# Patient Record
Sex: Female | Born: 1945 | Race: White | Hispanic: No | Marital: Married | Smoking: Never smoker
Health system: Southern US, Community
[De-identification: ages and names within clinical notes are randomized; demographics above are authoritative.]

## PROBLEM LIST (undated history)

## (undated) DIAGNOSIS — J302 Other seasonal allergic rhinitis: Secondary | ICD-10-CM

## (undated) DIAGNOSIS — K589 Irritable bowel syndrome without diarrhea: Secondary | ICD-10-CM

## (undated) DIAGNOSIS — I499 Cardiac arrhythmia, unspecified: Secondary | ICD-10-CM

## (undated) DIAGNOSIS — G43909 Migraine, unspecified, not intractable, without status migrainosus: Secondary | ICD-10-CM

## (undated) DIAGNOSIS — I429 Cardiomyopathy, unspecified: Secondary | ICD-10-CM

## (undated) DIAGNOSIS — M1990 Unspecified osteoarthritis, unspecified site: Secondary | ICD-10-CM

## (undated) DIAGNOSIS — K219 Gastro-esophageal reflux disease without esophagitis: Secondary | ICD-10-CM

## (undated) DIAGNOSIS — I10 Essential (primary) hypertension: Secondary | ICD-10-CM

## (undated) DIAGNOSIS — K5900 Constipation, unspecified: Secondary | ICD-10-CM

## (undated) DIAGNOSIS — F419 Anxiety disorder, unspecified: Secondary | ICD-10-CM

## (undated) DIAGNOSIS — E7800 Pure hypercholesterolemia, unspecified: Secondary | ICD-10-CM

## (undated) DIAGNOSIS — R519 Headache, unspecified: Secondary | ICD-10-CM

## (undated) HISTORY — PX: ABDOMINAL SURGERY: SHX537

## (undated) HISTORY — PX: COLON SURGERY: SHX602

## (undated) HISTORY — PX: COLONOSCOPY: SHX174

## (undated) HISTORY — PX: COLOSTOMY: SHX63

## (undated) HISTORY — PX: BREAST EXCISIONAL BIOPSY: SUR124

## (undated) HISTORY — PX: COLOSTOMY REVERSAL: SHX5782

---

## 1997-09-25 ENCOUNTER — Ambulatory Visit (HOSPITAL_COMMUNITY): Admission: RE | Admit: 1997-09-25 | Discharge: 1997-09-25 | Payer: Self-pay | Admitting: Internal Medicine

## 1999-01-23 ENCOUNTER — Encounter: Payer: Self-pay | Admitting: Internal Medicine

## 1999-01-23 ENCOUNTER — Encounter: Admission: RE | Admit: 1999-01-23 | Discharge: 1999-01-23 | Payer: Self-pay | Admitting: Internal Medicine

## 1999-07-22 ENCOUNTER — Ambulatory Visit (HOSPITAL_COMMUNITY): Admission: RE | Admit: 1999-07-22 | Discharge: 1999-07-22 | Payer: Self-pay | Admitting: Internal Medicine

## 1999-07-22 ENCOUNTER — Encounter: Payer: Self-pay | Admitting: Internal Medicine

## 2002-11-08 ENCOUNTER — Ambulatory Visit (HOSPITAL_COMMUNITY): Admission: RE | Admit: 2002-11-08 | Discharge: 2002-11-08 | Payer: Self-pay | Admitting: Internal Medicine

## 2002-11-08 ENCOUNTER — Encounter: Payer: Self-pay | Admitting: Internal Medicine

## 2003-11-06 ENCOUNTER — Other Ambulatory Visit: Admission: RE | Admit: 2003-11-06 | Discharge: 2003-11-06 | Payer: Self-pay | Admitting: Internal Medicine

## 2004-08-14 ENCOUNTER — Encounter (INDEPENDENT_AMBULATORY_CARE_PROVIDER_SITE_OTHER): Payer: Self-pay | Admitting: Specialist

## 2004-08-14 ENCOUNTER — Inpatient Hospital Stay (HOSPITAL_COMMUNITY): Admission: EM | Admit: 2004-08-14 | Discharge: 2004-08-23 | Payer: Self-pay | Admitting: Emergency Medicine

## 2005-02-27 ENCOUNTER — Inpatient Hospital Stay (HOSPITAL_COMMUNITY): Admission: RE | Admit: 2005-02-27 | Discharge: 2005-03-06 | Payer: Self-pay | Admitting: *Deleted

## 2005-02-27 ENCOUNTER — Encounter (INDEPENDENT_AMBULATORY_CARE_PROVIDER_SITE_OTHER): Payer: Self-pay | Admitting: *Deleted

## 2006-03-09 ENCOUNTER — Other Ambulatory Visit: Admission: RE | Admit: 2006-03-09 | Discharge: 2006-03-09 | Payer: Self-pay | Admitting: Family Medicine

## 2007-03-29 ENCOUNTER — Other Ambulatory Visit: Admission: RE | Admit: 2007-03-29 | Discharge: 2007-03-29 | Payer: Self-pay | Admitting: Family Medicine

## 2007-04-19 ENCOUNTER — Ambulatory Visit (HOSPITAL_COMMUNITY): Admission: RE | Admit: 2007-04-19 | Discharge: 2007-04-19 | Payer: Self-pay | Admitting: Family Medicine

## 2007-05-03 ENCOUNTER — Encounter (INDEPENDENT_AMBULATORY_CARE_PROVIDER_SITE_OTHER): Payer: Self-pay | Admitting: Radiology

## 2007-05-03 ENCOUNTER — Encounter: Admission: RE | Admit: 2007-05-03 | Discharge: 2007-05-03 | Payer: Self-pay | Admitting: Family Medicine

## 2008-04-03 ENCOUNTER — Other Ambulatory Visit: Admission: RE | Admit: 2008-04-03 | Discharge: 2008-04-03 | Payer: Self-pay | Admitting: Family Medicine

## 2009-08-20 ENCOUNTER — Encounter: Admission: RE | Admit: 2009-08-20 | Discharge: 2009-08-20 | Payer: Self-pay | Admitting: Family Medicine

## 2010-06-06 NOTE — Discharge Summary (Signed)
NAME:  Cynthia Bradshaw, Cynthia Bradshaw               ACCOUNT NO.:  [id number]   MEDICAL RECORD NO.:  [id number]          PATIENT TYPE:  INP   LOCATION:  5743                         FACILITY:  [facility]   PHYSICIAN:  Maisie Fus A. Cornett, M.D.DATE OF BIRTH:  06/02/1945   DATE OF ADMISSION:  08/14/2004  DATE OF DISCHARGE:  08/23/2004                                 DISCHARGE SUMMARY   ADMITTING DIAGNOSIS:  Perforated stercoral ulcer.   DISCHARGE DIAGNOSES:  1.  Perforated stercoral ulcer.  2.  Leukocytosis.  3.  Persistent elevation of liver function studies.  4.  History of irritable bowel syndrome.  5.  Gastroesophageal reflux disease.  6.  History of migraines.   BRIEF HISTORY:  The patient is a 65 year old female admitted on August 14, 2004, with a one-week history of abdominal pain.  She was seen and evaluated  and felt to have an acute abdomen.  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  She was  admitted to the hospital after this.   HOSPITAL COURSE:  Her postoperative course was complicated by the following  problems:   Problem 1. She had an elevation in her white count that remained elevated  for some time.  She was on Tygacil antibiotic on admission, and this  elevation of white count persisted for the next seven days.  She underwent  CT scanning which did not reveal any intra-abdominal abscess.  She had  bilateral pleural effusion, which were stable and/or asymptomatic.  Her  white count at this point in time could not be attributing to anything in  her abdomen.  We stopped her antibiotics at that point, and the white count  began to go down.   Problem 2. She had elevation of her liver function studies with a mild  elevation in her transaminases, but more elevation in her bilirubin.  Again,  this was felt to be secondary to Tygacil, and this drug was stopped as well.  Ultrasound of her abdomen was done, which showed no gallstones or common  duct dilatation.  She also had a CT scan which showed no evidence of biliary  tract disease as well.  MRCP was ordered, but done late, and that report is  still pending.  Her bilirubin peaked at 4.1 and remained stable there after  stopping her antibiotics three days prior to discharge.   She had been ambulating quite well, tolerating her diet without difficulty.  She had no evidence of significant tachycardia or fever.  Her ostomy looked  good and functioned well.  Her wound was clean, but was open in numerous  areas to facilitate drainage.  There was good granulation tissue at the base  of the wound and some exposed suture, but no obvious dehiscence was noted.  Her skin showed no signs of cellulitis, and her abdomen was nondistended and  nontender.  She was ambulating quite well, eating quite well, without any  problems whatsoever.   At this point in time, after a lengthy discussion with the patient about her  laboratory findings, they wished to go home.  Clinically, she was doing well  and was subsequently discharged home at that point.  She is having home  health follow her at this point in time, and they will do wound care.  We  have also asked them to draw her blood next week and have these faxed to the  office to see if they are stable.  The patient may require GI consultation  if this does not resolve.   DISCHARGE INSTRUCTIONS:  The patient will follow up in seven to 10 days with  Dr. Luan Pulling.  We will get home health arranged, which has already been  done for wound care.   DISCHARGE MEDICATIONS:  1.  Protonix 40 mg p.o. daily.  2.  Vicodin for pain.   She will be given supplies for wound care until home health can see her.   CONDITION ON DISCHARGE:  Satisfactory and stable.       TAC/MEDQ  D:  08/23/2004  T:   08/24/2004  Job:  [id number]

## 2010-06-06 NOTE — H&P (Signed)
NAME:  Cynthia Bradshaw, Cynthia Bradshaw NO.:  [id number]   MEDICAL RECORD NO.:  [id number]          PATIENT TYPE:  EMS   LOCATION:  MAJO                         FACILITY:  [facility]   PHYSICIAN:  Vikki Ports, MDDATE OF BIRTH:  1945/04/30   DATE OF ADMISSION:  08/14/2004  DATE OF DISCHARGE:                                HISTORY & PHYSICAL   CHIEF COMPLAINT:  Abdominal pain.   HISTORY OF PRESENT ILLNESS:  Ms. Mcphee has a 1 week history of crampy  abdominal pain consistent with her irritable bowel syndrome. This is  associated with nausea and vomiting. She states that the pain felt different  today and she developed a hard stomach. She really could not discern what  the difference was. Her last bowel movement was yesterday and she had poor  bowel movement according to the patient and her husband. Because of the  hard stomach, she came to the emergency room. X-ray in the emergency room  revealed free air into the diaphragm and this is consistent with perforated  viscus.   ALLERGIES:  None.   MEDICATIONS:  Prilosec over-the-counter, irritable bowel medication,  migraine medicine.   PAST MEDICAL HISTORY:  Irritable bowel syndrome, migraines, gastroesophageal  reflux disease.   SOCIAL HISTORY:  No tobacco and no alcohol or illicit drug use. She is  married and does yard work.   FAMILY HISTORY:  Unknown.   REVIEW OF SYSTEMS:  No chest pain. No shortness of breath. Otherwise as  above. Otherwise negative.   PHYSICAL EXAMINATION:  VITAL SIGNS:  Temperature 97.1, pulse 90, respiratory  rate 22, blood pressure 109/68.  GENERAL:  She appears acutely ill and in pain.  HEENT:  Grossly normal. No carotid or __________ bruits. No JVD or  thyromegaly. Sclerae clear. Conjunctivae normal. Nares, no discharge.  CHEST:  Clear to auscultation bilaterally. No wheezing or rhonchi.  HEART:  Tachycardiac. No gross murmur.  ABDOMEN:  Rigid abdomen. No bowel sounds.  EXTREMITIES:  No  peripheral edema.  SKIN:  Warm and dry.   LABORATORY DATA:  X-ray reveals free air in the diaphragm.   Potassium is 2.9, white count is 2.9.   ASSESSMENT/PLAN:  She has a perforated viscus. We will take her to the  operating room emergently. I will go ahead and replace her potassium on the  way to the operating room. The emergency room physician has already started  Tygacil as part of the antibiotic treatment. Her electrocardiogram  does  show non-specific STT wave changes in the inferolateral leads, which is  probably secondary to tachycardia.       LB/MEDQ  D:  08/14/2004  T:  08/14/2004  Job:  [id number]   cc:   Vikki Ports, MD  [address], [address]  [place]  [place] [address]

## 2010-06-06 NOTE — H&P (Signed)
NAME:  Cynthia Bradshaw, Cynthia Bradshaw          ACCOUNT NO.:  [id number]   MEDICAL RECORD NO.:  [id number]          PATIENT TYPE:  INP   LOCATION:  1610                         [facility]ACILITY:  [facility]   PHYSICIAN:  Alfonse Ras, MD   DATE OF BIRTH:  Jul 19, 1945   DATE OF ADMISSION:  02/27/2005  DATE OF DISCHARGE:  03/06/2005                                HISTORY & PHYSICAL   HISTORY OF PRESENT ILLNESS:  The patient is a pleasant 65 year old white  female who is now about six months status post Luz Brazen procedure for  perforated rectum secondary to a stercoral ulcer.  The patient was brought  in after undergoing home bowel prep for take-down of colostomy and ventral  hernia repair.   PAST MEDICAL HISTORY:  Noncontributory except for hypopotassemia in the past  requiring supplemental potassium.   MEDICATIONS ON ADMISSION:  I cannot find the medical reconciliation form in  the chart.   PHYSICAL EXAMINATION:  GENERAL APPEARANCE:  She is an age-appropriate white  female in no distress.  HEENT:  Examination is benign.  Normocephalic, atraumatic.  LUNGS:  Clear to auscultation and percussion.  HEART: Has a regular rate and rhythm.  ABDOMEN:  Shows a pink, well matured colostomy in the left lower quadrant  and a reducible midline ventral hernia.  EXTREMITIES:  Show no cyanosis, clubbing or edema.   IMPRESSION:  Ventral hernia and status post Hartman's procedure.   PLAN:  Admission after home bowel prep for takedown of colostomy and ventral  hernia repair.      Alfonse Ras, MD  Electronically Signed     KRE/MEDQ  D:  03/24/2005  T:  03/24/2005  Job:  [id number]

## 2010-06-06 NOTE — Op Note (Signed)
NAME:  Cynthia Bradshaw, Cynthia Bradshaw          ACCOUNT NO.:  [id number]   MEDICAL RECORD NO.:  [id number]          PATIENT TYPE:  INP   LOCATION:  0008                         FACILITY:  [facility]   PHYSICIAN:  Lebron Conners, M.D.   DATE OF BIRTH:  August 31, 1945   DATE OF PROCEDURE:  02/27/2005  DATE OF DISCHARGE:                                 OPERATIVE REPORT   PRE AND POSTOPERATIVE DIAGNOSIS:  Bleeding from the superficial part of the  abdominal wound.   OPERATION:  Evacuation of hematoma, control of bleeders, placement of  suction drain.   SURGEON:  Bowman.   ANESTHESIA:  General.   PROCEDURE:  After the patient was monitored and anesthetized and had routine  preparation and draping of the abdomen. I took out several more staples from  the midline abdominal wound and saw that there was very sizable hematoma. I  removed all the staples and evacuated the hematoma. I found the several  small oozing spots but no predominant bleeding vessel. I copiously irrigated  the wound and removed the irrigant several times and found further bleeders.  There seemed to be some oozing from the area of the colostomy closure so I  opened that wound and checked for hematoma there. I did not find any  bleeding at that location. After being satisfied that I had controlled the  bleeding, I placed a 10 mm flat Blake drain through a separate stab incision  in the left upper quadrant and secured it with 2-0 silk. I closed the wound  over that after mobilizing the skin slightly at of a couple of points. I  closed the colostomy with staples as well and placed Telfa wicks between the  staples. I applied a bulky slightly compressive bandage. The drain seemed to  hold suction well. The patient was stable throughout the procedure.      Lebron Conners, M.D.  Electronically Signed     WB/MEDQ  D:  02/27/2005  T:  03/02/2005  Job:  [id number]

## 2010-06-06 NOTE — Op Note (Signed)
NAME:  Cynthia Bradshaw, Cynthia Bradshaw          ACCOUNT NO.:  [id number]   MEDICAL RECORD NO.:  [id number]          PATIENT TYPE:  INP   LOCATION:  NA                           FACILITY:  [facility]   PHYSICIAN:  Alfonse Ras, MD   DATE OF BIRTH:  August 15, 1945   DATE OF PROCEDURE:  02/27/2005  DATE OF DISCHARGE:                                 OPERATIVE REPORT   PREOPERATIVE DIAGNOSIS:  Status post Hartmann procedure and colostomy and  ventral hernia.   POSTOPERATIVE DIAGNOSIS:  Status post Hartmann procedure and colostomy and  ventral hernia.   PROCEDURE:  Take down of colostomy with low EEA anastomosis and primary  ventral hernia repair.   SURGEON:  Baruch Merl, MD   ASSISTANT:  Marcille Blanco, MD   ANESTHESIA:  General.   DESCRIPTION:  The patient was taken to the operating room, placed in supine  position.  After adequate general anesthesia was induced using endotracheal  tube, the patient was placed in lithotomy position.  Foley catheter was  placed, and the abdomen was prepped and draped in normal sterile fashion.  Using elliptical incision around the previous white scar and granulation  tissue, this was taken off the hernia sac and discarded.  Entering the  abdomen, there were a number of adhesions that were taken down using sharp  and blunt dissection.  There was a small enterotomy made in the small bowel,  and this was repaired with interrupted 3-0 silk sutures.  The colostomy was  then mobilized and surrounded.  The rectal stump was then identified, and  adhesions were taken down from that.  It was a quite low rectal stump.  An  elliptical incision was made around the colostomy, and the colostomy was  mobilized off the fascia and placed within the abdomen.  An Allen clamp and  Kocher clamps were placed on it, and the end of the colostomy was cut off  and discarded.  A 2-0 Prolene pursestring suture was then placed around the  open end of the proximal colon, and a 25 anvil was placed  within it.  Going  below, the EA stapler was brought up but had a tight turn near the end, and  therefore the proximal end of the rectal stump was trimmed using a contour  GIA stapling device.  The EEA was then passed up and distal to the staple  line, connected with the anvil, closed, held in place for a minute, and then  fired.  We had 2 fairly good donuts.  It was then clamped proximally, and  the pelvis was irrigated and rigid sigmoidoscopy was performed which showed  no evidence of leak.  The anastomosis was reinforced with Tisseel.  The  abdomen was then copiously irrigated.  The fascial edges were mobilized, and  there was quite a large defect and was closed with running #1 and  interrupted #1 figure-of-eight Novofils.  Skin was closed with staples.  The  colostomy defect was closed with interrupted #1 Novofils, and the skin was  closed with staples, and Telfa was placed.  Sterile dressing was applied.  The patient tolerated the procedure well  and went to PACU in good condition.     Alfonse Ras, MD  Electronically Signed    KRE/MEDQ  D:  02/27/2005  T:  02/28/2005  Job:  [id number]

## 2010-06-06 NOTE — Discharge Summary (Signed)
NAME:  Cynthia Bradshaw, Cynthia Bradshaw          ACCOUNT NO.:  [id number]   MEDICAL RECORD NO.:  [id number]          PATIENT TYPE:  INP   LOCATION:  1610                         FACILITY:  [facility]   PHYSICIAN:  Alfonse Ras, MD   DATE OF BIRTH:  31-Jan-1945   DATE OF ADMISSION:  02/27/2005  DATE OF DISCHARGE:  03/06/2005                                 DISCHARGE SUMMARY   ADMISSION DIAGNOSES:  1.  Status post Hartmann procedure with an end colostomy.  2.  Ventral hernia.   DISCHARGE DIAGNOSES:  1.  Status post Hartmann procedure with an end colostomy.  2.  Ventral hernia.   DISCHARGE CONDITION:  Good and improved.   DISPOSITION:  Discharged to home.   HOSPITAL COURSE:  The patient was admitted after home bowel prep and  underwent a takedown colostomy and ventral hernia repair.  Postoperatively  in the recovery room, however, the patient was noticed to have a significant  amount of bleeding from the wound.  She was taken back to the operating room  by Dr. Marcy Panning and had evacuation of hematoma and drain placed.  Postoperatively from that, her ileus was fairly slow to resolve, but she was  started on clear liquids on postoperative day #3, and by postoperative day  #5 she was advanced to a regular diet, which she was tolerating well and was  having bowel movements.   FOLLOW UP:  Followup is with me two weeks after discharge.      Alfonse Ras, MD  Electronically Signed     KRE/MEDQ  D:  03/24/2005  T:  03/25/2005  Job:  [id number]

## 2010-06-06 NOTE — Op Note (Signed)
Cynthia Bradshaw, WUEBKER               ACCOUNT NO.:  [id number]   MEDICAL RECORD NO.:  [id number]          PATIENT TYPE:  INP   LOCATION:  5743                         FACILITY:  [facility]   PHYSICIAN:  Vikki Ports, MDDATE OF BIRTH:  [birth year]   DATE OF PROCEDURE:  08/14/2004  DATE OF DISCHARGE:                                 OPERATIVE REPORT   PREOPERATIVE DIAGNOSES:  Perforated viscus.   POSTOPERATIVE DIAGNOSES:  Perforated stercoral ulcer in the rectum versus  perforated diverticulum of the rectum and peritonitis.   OPERATION PERFORMED:  Exploratory laparotomy and Hartmann procedure with  colostomy.   SURGEON:  Vikki Ports, M.D.   ANESTHESIA:  General.   DESCRIPTION OF PROCEDURE:  The patient was taken to the operating room and  placed in supine position.  After adequate general anesthesia was induced  using endotracheal tube, the abdomen was prepped and draped in the normal  sterile fashion.  Using a vertical midline incision, I dissected down  through the fascia and into the abdomen.  A small amount of free air was  encountered and a significant amount of bilious appearing fluid which was  not feculent.  I extended my incision superiorly suspecting a perforated  duodenal or gastric ulcer.  On inspecting the stomach and duodenum, no ulcer  was found.  I then found the ligament of Treitz and ran the small bowel and  found it quite stuck to the left lower quadrant in the sigmoid colon.  Of  note, on mobilizing the small bowel, feculent material was noted with  obviously fecal contamination.  Of note, the entire colon from the cecum to  the area of perforation and even distal to it was completely impacted with  fecal material and almost tennis ball sized stool balls.  The area of  perforation was identified and the sigmoid colon was mobilized.  There was a  significant amount of inflammation in the left lower quadrant but the ureter  was identified.  Using a  contoured green load GIA stapling device, the  rectum was divided distal to the site of perforation.  The mesentery was  then taken down between [facility] clamps and a proximal division was  accomplished using a GIA 75 stapling device.  The specimen was removed and  sent for pathological evaluation.  A circular skin incision was made in the  left lower quadrant and a cruciate incision was made in the anterior and  posterior fascia in a muscle splitting technique.  The proximal colon was  brought out through this incision and left externally.  The abdomen was  copiously irrigated with 7 or 8 liters of fluid.  The rectal stump was  tagged with a 2-0 Prolene suture.  The fascia was closed with running #1  Novofil.  Skin was closed loosely with staples and packed.  The colostomy  was matured in the standard fashion using interrupted 3-0 Vicryl sutures.  Sterile dressings and colostomy bag was placed.  The patient tolerated the  procedure well and went to PACU in good condition.      KRH/MEDQ  D:  08/14/2004  T:  08/15/2004  Job:  [id number]

## 2011-04-21 DIAGNOSIS — K589 Irritable bowel syndrome without diarrhea: Secondary | ICD-10-CM | POA: Diagnosis not present

## 2011-04-21 DIAGNOSIS — M810 Age-related osteoporosis without current pathological fracture: Secondary | ICD-10-CM | POA: Diagnosis not present

## 2011-04-21 DIAGNOSIS — I10 Essential (primary) hypertension: Secondary | ICD-10-CM | POA: Diagnosis not present

## 2011-04-21 DIAGNOSIS — Z23 Encounter for immunization: Secondary | ICD-10-CM | POA: Diagnosis not present

## 2011-04-21 DIAGNOSIS — G43919 Migraine, unspecified, intractable, without status migrainosus: Secondary | ICD-10-CM | POA: Diagnosis not present

## 2011-04-21 DIAGNOSIS — Z0000 Encounter for general adult medical examination without abnormal findings: Secondary | ICD-10-CM | POA: Diagnosis not present

## 2011-04-21 DIAGNOSIS — Z1331 Encounter for screening for depression: Secondary | ICD-10-CM | POA: Diagnosis not present

## 2011-04-28 DIAGNOSIS — M810 Age-related osteoporosis without current pathological fracture: Secondary | ICD-10-CM | POA: Diagnosis not present

## 2011-06-22 DIAGNOSIS — M9981 Other biomechanical lesions of cervical region: Secondary | ICD-10-CM | POA: Diagnosis not present

## 2011-06-22 DIAGNOSIS — M5137 Other intervertebral disc degeneration, lumbosacral region: Secondary | ICD-10-CM | POA: Diagnosis not present

## 2011-06-22 DIAGNOSIS — M999 Biomechanical lesion, unspecified: Secondary | ICD-10-CM | POA: Diagnosis not present

## 2011-06-23 DIAGNOSIS — M5137 Other intervertebral disc degeneration, lumbosacral region: Secondary | ICD-10-CM | POA: Diagnosis not present

## 2011-06-23 DIAGNOSIS — M9981 Other biomechanical lesions of cervical region: Secondary | ICD-10-CM | POA: Diagnosis not present

## 2011-06-23 DIAGNOSIS — M999 Biomechanical lesion, unspecified: Secondary | ICD-10-CM | POA: Diagnosis not present

## 2011-06-24 DIAGNOSIS — M9981 Other biomechanical lesions of cervical region: Secondary | ICD-10-CM | POA: Diagnosis not present

## 2011-06-24 DIAGNOSIS — M5137 Other intervertebral disc degeneration, lumbosacral region: Secondary | ICD-10-CM | POA: Diagnosis not present

## 2011-06-24 DIAGNOSIS — M999 Biomechanical lesion, unspecified: Secondary | ICD-10-CM | POA: Diagnosis not present

## 2011-06-25 DIAGNOSIS — M9981 Other biomechanical lesions of cervical region: Secondary | ICD-10-CM | POA: Diagnosis not present

## 2011-06-25 DIAGNOSIS — M5137 Other intervertebral disc degeneration, lumbosacral region: Secondary | ICD-10-CM | POA: Diagnosis not present

## 2011-06-25 DIAGNOSIS — M999 Biomechanical lesion, unspecified: Secondary | ICD-10-CM | POA: Diagnosis not present

## 2011-06-29 DIAGNOSIS — M999 Biomechanical lesion, unspecified: Secondary | ICD-10-CM | POA: Diagnosis not present

## 2011-06-29 DIAGNOSIS — M9981 Other biomechanical lesions of cervical region: Secondary | ICD-10-CM | POA: Diagnosis not present

## 2011-06-29 DIAGNOSIS — M5137 Other intervertebral disc degeneration, lumbosacral region: Secondary | ICD-10-CM | POA: Diagnosis not present

## 2011-06-30 DIAGNOSIS — M5137 Other intervertebral disc degeneration, lumbosacral region: Secondary | ICD-10-CM | POA: Diagnosis not present

## 2011-06-30 DIAGNOSIS — M9981 Other biomechanical lesions of cervical region: Secondary | ICD-10-CM | POA: Diagnosis not present

## 2011-06-30 DIAGNOSIS — M999 Biomechanical lesion, unspecified: Secondary | ICD-10-CM | POA: Diagnosis not present

## 2011-07-01 DIAGNOSIS — M5137 Other intervertebral disc degeneration, lumbosacral region: Secondary | ICD-10-CM | POA: Diagnosis not present

## 2011-07-01 DIAGNOSIS — M999 Biomechanical lesion, unspecified: Secondary | ICD-10-CM | POA: Diagnosis not present

## 2011-07-01 DIAGNOSIS — M9981 Other biomechanical lesions of cervical region: Secondary | ICD-10-CM | POA: Diagnosis not present

## 2011-07-02 DIAGNOSIS — M999 Biomechanical lesion, unspecified: Secondary | ICD-10-CM | POA: Diagnosis not present

## 2011-07-02 DIAGNOSIS — M9981 Other biomechanical lesions of cervical region: Secondary | ICD-10-CM | POA: Diagnosis not present

## 2011-07-02 DIAGNOSIS — M5137 Other intervertebral disc degeneration, lumbosacral region: Secondary | ICD-10-CM | POA: Diagnosis not present

## 2011-07-14 DIAGNOSIS — M999 Biomechanical lesion, unspecified: Secondary | ICD-10-CM | POA: Diagnosis not present

## 2011-07-14 DIAGNOSIS — M5137 Other intervertebral disc degeneration, lumbosacral region: Secondary | ICD-10-CM | POA: Diagnosis not present

## 2011-07-14 DIAGNOSIS — M9981 Other biomechanical lesions of cervical region: Secondary | ICD-10-CM | POA: Diagnosis not present

## 2011-07-15 DIAGNOSIS — M5137 Other intervertebral disc degeneration, lumbosacral region: Secondary | ICD-10-CM | POA: Diagnosis not present

## 2011-07-15 DIAGNOSIS — M545 Low back pain, unspecified: Secondary | ICD-10-CM | POA: Diagnosis not present

## 2011-07-15 DIAGNOSIS — M9981 Other biomechanical lesions of cervical region: Secondary | ICD-10-CM | POA: Diagnosis not present

## 2011-07-15 DIAGNOSIS — M999 Biomechanical lesion, unspecified: Secondary | ICD-10-CM | POA: Diagnosis not present

## 2011-07-16 DIAGNOSIS — M999 Biomechanical lesion, unspecified: Secondary | ICD-10-CM | POA: Diagnosis not present

## 2011-07-16 DIAGNOSIS — M5137 Other intervertebral disc degeneration, lumbosacral region: Secondary | ICD-10-CM | POA: Diagnosis not present

## 2011-07-16 DIAGNOSIS — M9981 Other biomechanical lesions of cervical region: Secondary | ICD-10-CM | POA: Diagnosis not present

## 2011-07-20 DIAGNOSIS — M999 Biomechanical lesion, unspecified: Secondary | ICD-10-CM | POA: Diagnosis not present

## 2011-07-20 DIAGNOSIS — M5137 Other intervertebral disc degeneration, lumbosacral region: Secondary | ICD-10-CM | POA: Diagnosis not present

## 2011-07-20 DIAGNOSIS — M9981 Other biomechanical lesions of cervical region: Secondary | ICD-10-CM | POA: Diagnosis not present

## 2011-07-21 DIAGNOSIS — M999 Biomechanical lesion, unspecified: Secondary | ICD-10-CM | POA: Diagnosis not present

## 2011-07-21 DIAGNOSIS — M9981 Other biomechanical lesions of cervical region: Secondary | ICD-10-CM | POA: Diagnosis not present

## 2011-07-21 DIAGNOSIS — M5137 Other intervertebral disc degeneration, lumbosacral region: Secondary | ICD-10-CM | POA: Diagnosis not present

## 2011-07-22 DIAGNOSIS — M9981 Other biomechanical lesions of cervical region: Secondary | ICD-10-CM | POA: Diagnosis not present

## 2011-07-22 DIAGNOSIS — M5137 Other intervertebral disc degeneration, lumbosacral region: Secondary | ICD-10-CM | POA: Diagnosis not present

## 2011-07-22 DIAGNOSIS — M999 Biomechanical lesion, unspecified: Secondary | ICD-10-CM | POA: Diagnosis not present

## 2011-07-27 DIAGNOSIS — M999 Biomechanical lesion, unspecified: Secondary | ICD-10-CM | POA: Diagnosis not present

## 2011-07-27 DIAGNOSIS — M5137 Other intervertebral disc degeneration, lumbosacral region: Secondary | ICD-10-CM | POA: Diagnosis not present

## 2011-07-27 DIAGNOSIS — M9981 Other biomechanical lesions of cervical region: Secondary | ICD-10-CM | POA: Diagnosis not present

## 2011-07-29 DIAGNOSIS — M5137 Other intervertebral disc degeneration, lumbosacral region: Secondary | ICD-10-CM | POA: Diagnosis not present

## 2011-07-29 DIAGNOSIS — M9981 Other biomechanical lesions of cervical region: Secondary | ICD-10-CM | POA: Diagnosis not present

## 2011-07-29 DIAGNOSIS — M999 Biomechanical lesion, unspecified: Secondary | ICD-10-CM | POA: Diagnosis not present

## 2011-07-30 DIAGNOSIS — M5137 Other intervertebral disc degeneration, lumbosacral region: Secondary | ICD-10-CM | POA: Diagnosis not present

## 2011-07-30 DIAGNOSIS — M9981 Other biomechanical lesions of cervical region: Secondary | ICD-10-CM | POA: Diagnosis not present

## 2011-07-30 DIAGNOSIS — M999 Biomechanical lesion, unspecified: Secondary | ICD-10-CM | POA: Diagnosis not present

## 2011-08-03 DIAGNOSIS — M5137 Other intervertebral disc degeneration, lumbosacral region: Secondary | ICD-10-CM | POA: Diagnosis not present

## 2011-08-03 DIAGNOSIS — M9981 Other biomechanical lesions of cervical region: Secondary | ICD-10-CM | POA: Diagnosis not present

## 2011-08-03 DIAGNOSIS — M999 Biomechanical lesion, unspecified: Secondary | ICD-10-CM | POA: Diagnosis not present

## 2011-08-05 DIAGNOSIS — M999 Biomechanical lesion, unspecified: Secondary | ICD-10-CM | POA: Diagnosis not present

## 2011-08-05 DIAGNOSIS — M5137 Other intervertebral disc degeneration, lumbosacral region: Secondary | ICD-10-CM | POA: Diagnosis not present

## 2011-08-05 DIAGNOSIS — M9981 Other biomechanical lesions of cervical region: Secondary | ICD-10-CM | POA: Diagnosis not present

## 2011-08-06 DIAGNOSIS — M5137 Other intervertebral disc degeneration, lumbosacral region: Secondary | ICD-10-CM | POA: Diagnosis not present

## 2011-08-06 DIAGNOSIS — M9981 Other biomechanical lesions of cervical region: Secondary | ICD-10-CM | POA: Diagnosis not present

## 2011-08-06 DIAGNOSIS — M999 Biomechanical lesion, unspecified: Secondary | ICD-10-CM | POA: Diagnosis not present

## 2011-08-10 DIAGNOSIS — M9981 Other biomechanical lesions of cervical region: Secondary | ICD-10-CM | POA: Diagnosis not present

## 2011-08-10 DIAGNOSIS — M999 Biomechanical lesion, unspecified: Secondary | ICD-10-CM | POA: Diagnosis not present

## 2011-08-10 DIAGNOSIS — M5137 Other intervertebral disc degeneration, lumbosacral region: Secondary | ICD-10-CM | POA: Diagnosis not present

## 2011-08-12 DIAGNOSIS — M5137 Other intervertebral disc degeneration, lumbosacral region: Secondary | ICD-10-CM | POA: Diagnosis not present

## 2011-08-12 DIAGNOSIS — M9981 Other biomechanical lesions of cervical region: Secondary | ICD-10-CM | POA: Diagnosis not present

## 2011-08-12 DIAGNOSIS — M999 Biomechanical lesion, unspecified: Secondary | ICD-10-CM | POA: Diagnosis not present

## 2011-08-13 DIAGNOSIS — M999 Biomechanical lesion, unspecified: Secondary | ICD-10-CM | POA: Diagnosis not present

## 2011-08-13 DIAGNOSIS — M9981 Other biomechanical lesions of cervical region: Secondary | ICD-10-CM | POA: Diagnosis not present

## 2011-08-13 DIAGNOSIS — M5137 Other intervertebral disc degeneration, lumbosacral region: Secondary | ICD-10-CM | POA: Diagnosis not present

## 2011-08-17 DIAGNOSIS — M5137 Other intervertebral disc degeneration, lumbosacral region: Secondary | ICD-10-CM | POA: Diagnosis not present

## 2011-08-17 DIAGNOSIS — M999 Biomechanical lesion, unspecified: Secondary | ICD-10-CM | POA: Diagnosis not present

## 2011-08-17 DIAGNOSIS — M9981 Other biomechanical lesions of cervical region: Secondary | ICD-10-CM | POA: Diagnosis not present

## 2011-08-20 DIAGNOSIS — M9981 Other biomechanical lesions of cervical region: Secondary | ICD-10-CM | POA: Diagnosis not present

## 2011-08-20 DIAGNOSIS — M5137 Other intervertebral disc degeneration, lumbosacral region: Secondary | ICD-10-CM | POA: Diagnosis not present

## 2011-08-20 DIAGNOSIS — M999 Biomechanical lesion, unspecified: Secondary | ICD-10-CM | POA: Diagnosis not present

## 2011-08-26 DIAGNOSIS — M9981 Other biomechanical lesions of cervical region: Secondary | ICD-10-CM | POA: Diagnosis not present

## 2011-08-26 DIAGNOSIS — M999 Biomechanical lesion, unspecified: Secondary | ICD-10-CM | POA: Diagnosis not present

## 2011-08-26 DIAGNOSIS — M5137 Other intervertebral disc degeneration, lumbosacral region: Secondary | ICD-10-CM | POA: Diagnosis not present

## 2011-09-02 DIAGNOSIS — M5137 Other intervertebral disc degeneration, lumbosacral region: Secondary | ICD-10-CM | POA: Diagnosis not present

## 2011-09-02 DIAGNOSIS — M9981 Other biomechanical lesions of cervical region: Secondary | ICD-10-CM | POA: Diagnosis not present

## 2011-09-02 DIAGNOSIS — M999 Biomechanical lesion, unspecified: Secondary | ICD-10-CM | POA: Diagnosis not present

## 2011-10-21 DIAGNOSIS — K589 Irritable bowel syndrome without diarrhea: Secondary | ICD-10-CM | POA: Diagnosis not present

## 2011-10-21 DIAGNOSIS — Z23 Encounter for immunization: Secondary | ICD-10-CM | POA: Diagnosis not present

## 2011-10-21 DIAGNOSIS — I10 Essential (primary) hypertension: Secondary | ICD-10-CM | POA: Diagnosis not present

## 2012-03-21 DIAGNOSIS — H109 Unspecified conjunctivitis: Secondary | ICD-10-CM | POA: Diagnosis not present

## 2012-03-21 DIAGNOSIS — M25569 Pain in unspecified knee: Secondary | ICD-10-CM | POA: Diagnosis not present

## 2012-03-21 DIAGNOSIS — H539 Unspecified visual disturbance: Secondary | ICD-10-CM | POA: Diagnosis not present

## 2012-03-24 DIAGNOSIS — H518 Other specified disorders of binocular movement: Secondary | ICD-10-CM | POA: Diagnosis not present

## 2012-03-24 DIAGNOSIS — H532 Diplopia: Secondary | ICD-10-CM | POA: Diagnosis not present

## 2012-03-30 ENCOUNTER — Other Ambulatory Visit: Payer: Self-pay | Admitting: Family Medicine

## 2012-03-30 DIAGNOSIS — H532 Diplopia: Secondary | ICD-10-CM

## 2012-04-05 DIAGNOSIS — H539 Unspecified visual disturbance: Secondary | ICD-10-CM | POA: Diagnosis not present

## 2012-04-05 DIAGNOSIS — Z1389 Encounter for screening for other disorder: Secondary | ICD-10-CM | POA: Diagnosis not present

## 2012-04-07 ENCOUNTER — Ambulatory Visit
Admission: RE | Admit: 2012-04-07 | Discharge: 2012-04-07 | Disposition: A | Discharge status: Home / Self Care | Payer: Medicare Other | Source: Ambulatory Visit | Attending: Family Medicine | Admitting: Family Medicine

## 2012-04-07 DIAGNOSIS — H532 Diplopia: Secondary | ICD-10-CM | POA: Diagnosis not present

## 2012-04-07 MED ORDER — GADOBENATE DIMEGLUMINE 529 MG/ML IV SOLN
10.0000 mL | Freq: Once | INTRAVENOUS | Status: AC | PRN
  Administered 2012-04-07: 10 mL via INTRAVENOUS

## 2012-04-13 DIAGNOSIS — H1045 Other chronic allergic conjunctivitis: Secondary | ICD-10-CM | POA: Diagnosis not present

## 2012-04-13 DIAGNOSIS — H04129 Dry eye syndrome of unspecified lacrimal gland: Secondary | ICD-10-CM | POA: Diagnosis not present

## 2012-07-12 DIAGNOSIS — M810 Age-related osteoporosis without current pathological fracture: Secondary | ICD-10-CM | POA: Diagnosis not present

## 2012-07-12 DIAGNOSIS — F411 Generalized anxiety disorder: Secondary | ICD-10-CM | POA: Diagnosis not present

## 2012-07-12 DIAGNOSIS — I10 Essential (primary) hypertension: Secondary | ICD-10-CM | POA: Diagnosis not present

## 2012-07-12 DIAGNOSIS — Z0000 Encounter for general adult medical examination without abnormal findings: Secondary | ICD-10-CM | POA: Diagnosis not present

## 2012-07-12 DIAGNOSIS — G43919 Migraine, unspecified, intractable, without status migrainosus: Secondary | ICD-10-CM | POA: Diagnosis not present

## 2012-07-12 DIAGNOSIS — K589 Irritable bowel syndrome without diarrhea: Secondary | ICD-10-CM | POA: Diagnosis not present

## 2012-07-12 DIAGNOSIS — J309 Allergic rhinitis, unspecified: Secondary | ICD-10-CM | POA: Diagnosis not present

## 2012-10-10 DIAGNOSIS — E7800 Pure hypercholesterolemia, unspecified: Secondary | ICD-10-CM | POA: Diagnosis not present

## 2013-02-15 DIAGNOSIS — M999 Biomechanical lesion, unspecified: Secondary | ICD-10-CM | POA: Diagnosis not present

## 2013-02-15 DIAGNOSIS — M9981 Other biomechanical lesions of cervical region: Secondary | ICD-10-CM | POA: Diagnosis not present

## 2013-02-15 DIAGNOSIS — Z713 Dietary counseling and surveillance: Secondary | ICD-10-CM | POA: Diagnosis not present

## 2013-02-15 DIAGNOSIS — M5137 Other intervertebral disc degeneration, lumbosacral region: Secondary | ICD-10-CM | POA: Diagnosis not present

## 2013-03-13 ENCOUNTER — Other Ambulatory Visit: Payer: Self-pay

## 2013-03-13 DIAGNOSIS — Z1231 Encounter for screening mammogram for malignant neoplasm of breast: Secondary | ICD-10-CM

## 2013-03-15 DIAGNOSIS — M9981 Other biomechanical lesions of cervical region: Secondary | ICD-10-CM | POA: Diagnosis not present

## 2013-03-15 DIAGNOSIS — M999 Biomechanical lesion, unspecified: Secondary | ICD-10-CM | POA: Diagnosis not present

## 2013-03-15 DIAGNOSIS — M5137 Other intervertebral disc degeneration, lumbosacral region: Secondary | ICD-10-CM | POA: Diagnosis not present

## 2013-03-24 DIAGNOSIS — E7800 Pure hypercholesterolemia, unspecified: Secondary | ICD-10-CM | POA: Diagnosis not present

## 2013-03-24 DIAGNOSIS — I10 Essential (primary) hypertension: Secondary | ICD-10-CM | POA: Diagnosis not present

## 2013-03-28 ENCOUNTER — Ambulatory Visit
Admission: RE | Admit: 2013-03-28 | Discharge: 2013-03-28 | Disposition: A | Discharge status: Home / Self Care | Payer: Medicare Other | Source: Ambulatory Visit

## 2013-03-28 DIAGNOSIS — Z1231 Encounter for screening mammogram for malignant neoplasm of breast: Secondary | ICD-10-CM

## 2013-04-12 DIAGNOSIS — M9981 Other biomechanical lesions of cervical region: Secondary | ICD-10-CM | POA: Diagnosis not present

## 2013-04-12 DIAGNOSIS — M999 Biomechanical lesion, unspecified: Secondary | ICD-10-CM | POA: Diagnosis not present

## 2013-04-12 DIAGNOSIS — M5137 Other intervertebral disc degeneration, lumbosacral region: Secondary | ICD-10-CM | POA: Diagnosis not present

## 2013-04-26 DIAGNOSIS — M9981 Other biomechanical lesions of cervical region: Secondary | ICD-10-CM | POA: Diagnosis not present

## 2013-04-26 DIAGNOSIS — M5137 Other intervertebral disc degeneration, lumbosacral region: Secondary | ICD-10-CM | POA: Diagnosis not present

## 2013-04-26 DIAGNOSIS — M999 Biomechanical lesion, unspecified: Secondary | ICD-10-CM | POA: Diagnosis not present

## 2013-05-10 DIAGNOSIS — M9981 Other biomechanical lesions of cervical region: Secondary | ICD-10-CM | POA: Diagnosis not present

## 2013-05-10 DIAGNOSIS — M5137 Other intervertebral disc degeneration, lumbosacral region: Secondary | ICD-10-CM | POA: Diagnosis not present

## 2013-05-10 DIAGNOSIS — M999 Biomechanical lesion, unspecified: Secondary | ICD-10-CM | POA: Diagnosis not present

## 2013-05-24 DIAGNOSIS — M9981 Other biomechanical lesions of cervical region: Secondary | ICD-10-CM | POA: Diagnosis not present

## 2013-05-24 DIAGNOSIS — M5137 Other intervertebral disc degeneration, lumbosacral region: Secondary | ICD-10-CM | POA: Diagnosis not present

## 2013-05-24 DIAGNOSIS — M999 Biomechanical lesion, unspecified: Secondary | ICD-10-CM | POA: Diagnosis not present

## 2013-06-07 DIAGNOSIS — M9981 Other biomechanical lesions of cervical region: Secondary | ICD-10-CM | POA: Diagnosis not present

## 2013-06-07 DIAGNOSIS — M999 Biomechanical lesion, unspecified: Secondary | ICD-10-CM | POA: Diagnosis not present

## 2013-06-07 DIAGNOSIS — M5137 Other intervertebral disc degeneration, lumbosacral region: Secondary | ICD-10-CM | POA: Diagnosis not present

## 2013-06-21 DIAGNOSIS — M999 Biomechanical lesion, unspecified: Secondary | ICD-10-CM | POA: Diagnosis not present

## 2013-06-21 DIAGNOSIS — M5030 Other cervical disc degeneration, unspecified cervical region: Secondary | ICD-10-CM | POA: Diagnosis not present

## 2013-06-21 DIAGNOSIS — M9981 Other biomechanical lesions of cervical region: Secondary | ICD-10-CM | POA: Diagnosis not present

## 2013-07-05 DIAGNOSIS — M5030 Other cervical disc degeneration, unspecified cervical region: Secondary | ICD-10-CM | POA: Diagnosis not present

## 2013-07-05 DIAGNOSIS — M9981 Other biomechanical lesions of cervical region: Secondary | ICD-10-CM | POA: Diagnosis not present

## 2013-07-05 DIAGNOSIS — M999 Biomechanical lesion, unspecified: Secondary | ICD-10-CM | POA: Diagnosis not present

## 2013-07-19 DIAGNOSIS — M999 Biomechanical lesion, unspecified: Secondary | ICD-10-CM | POA: Diagnosis not present

## 2013-07-19 DIAGNOSIS — M9981 Other biomechanical lesions of cervical region: Secondary | ICD-10-CM | POA: Diagnosis not present

## 2013-07-19 DIAGNOSIS — M5030 Other cervical disc degeneration, unspecified cervical region: Secondary | ICD-10-CM | POA: Diagnosis not present

## 2013-08-02 DIAGNOSIS — M999 Biomechanical lesion, unspecified: Secondary | ICD-10-CM | POA: Diagnosis not present

## 2013-08-02 DIAGNOSIS — M5030 Other cervical disc degeneration, unspecified cervical region: Secondary | ICD-10-CM | POA: Diagnosis not present

## 2013-08-02 DIAGNOSIS — M9981 Other biomechanical lesions of cervical region: Secondary | ICD-10-CM | POA: Diagnosis not present

## 2013-08-16 DIAGNOSIS — M999 Biomechanical lesion, unspecified: Secondary | ICD-10-CM | POA: Diagnosis not present

## 2013-08-16 DIAGNOSIS — M5030 Other cervical disc degeneration, unspecified cervical region: Secondary | ICD-10-CM | POA: Diagnosis not present

## 2013-08-16 DIAGNOSIS — M9981 Other biomechanical lesions of cervical region: Secondary | ICD-10-CM | POA: Diagnosis not present

## 2013-08-30 DIAGNOSIS — M9981 Other biomechanical lesions of cervical region: Secondary | ICD-10-CM | POA: Diagnosis not present

## 2013-08-30 DIAGNOSIS — M999 Biomechanical lesion, unspecified: Secondary | ICD-10-CM | POA: Diagnosis not present

## 2013-08-30 DIAGNOSIS — M5030 Other cervical disc degeneration, unspecified cervical region: Secondary | ICD-10-CM | POA: Diagnosis not present

## 2013-09-26 DIAGNOSIS — M5030 Other cervical disc degeneration, unspecified cervical region: Secondary | ICD-10-CM | POA: Diagnosis not present

## 2013-09-26 DIAGNOSIS — M999 Biomechanical lesion, unspecified: Secondary | ICD-10-CM | POA: Diagnosis not present

## 2013-09-26 DIAGNOSIS — M9981 Other biomechanical lesions of cervical region: Secondary | ICD-10-CM | POA: Diagnosis not present

## 2013-09-27 DIAGNOSIS — E7800 Pure hypercholesterolemia, unspecified: Secondary | ICD-10-CM | POA: Diagnosis not present

## 2013-09-27 DIAGNOSIS — K589 Irritable bowel syndrome without diarrhea: Secondary | ICD-10-CM | POA: Diagnosis not present

## 2013-09-27 DIAGNOSIS — M810 Age-related osteoporosis without current pathological fracture: Secondary | ICD-10-CM | POA: Diagnosis not present

## 2013-09-27 DIAGNOSIS — J309 Allergic rhinitis, unspecified: Secondary | ICD-10-CM | POA: Diagnosis not present

## 2013-09-27 DIAGNOSIS — Z0000 Encounter for general adult medical examination without abnormal findings: Secondary | ICD-10-CM | POA: Diagnosis not present

## 2013-09-27 DIAGNOSIS — I10 Essential (primary) hypertension: Secondary | ICD-10-CM | POA: Diagnosis not present

## 2013-09-27 DIAGNOSIS — Z23 Encounter for immunization: Secondary | ICD-10-CM | POA: Diagnosis not present

## 2013-09-27 DIAGNOSIS — F411 Generalized anxiety disorder: Secondary | ICD-10-CM | POA: Diagnosis not present

## 2013-10-04 DIAGNOSIS — H04129 Dry eye syndrome of unspecified lacrimal gland: Secondary | ICD-10-CM | POA: Diagnosis not present

## 2013-10-04 DIAGNOSIS — H1045 Other chronic allergic conjunctivitis: Secondary | ICD-10-CM | POA: Diagnosis not present

## 2013-10-04 DIAGNOSIS — IMO0002 Reserved for concepts with insufficient information to code with codable children: Secondary | ICD-10-CM | POA: Diagnosis not present

## 2013-10-04 DIAGNOSIS — H2510 Age-related nuclear cataract, unspecified eye: Secondary | ICD-10-CM | POA: Diagnosis not present

## 2013-10-25 DIAGNOSIS — M9903 Segmental and somatic dysfunction of lumbar region: Secondary | ICD-10-CM | POA: Diagnosis not present

## 2013-10-25 DIAGNOSIS — M5137 Other intervertebral disc degeneration, lumbosacral region: Secondary | ICD-10-CM | POA: Diagnosis not present

## 2013-10-25 DIAGNOSIS — M9901 Segmental and somatic dysfunction of cervical region: Secondary | ICD-10-CM | POA: Diagnosis not present

## 2013-10-25 DIAGNOSIS — M9902 Segmental and somatic dysfunction of thoracic region: Secondary | ICD-10-CM | POA: Diagnosis not present

## 2013-11-22 DIAGNOSIS — M9903 Segmental and somatic dysfunction of lumbar region: Secondary | ICD-10-CM | POA: Diagnosis not present

## 2013-11-22 DIAGNOSIS — M5137 Other intervertebral disc degeneration, lumbosacral region: Secondary | ICD-10-CM | POA: Diagnosis not present

## 2013-11-22 DIAGNOSIS — M9902 Segmental and somatic dysfunction of thoracic region: Secondary | ICD-10-CM | POA: Diagnosis not present

## 2013-11-22 DIAGNOSIS — M9901 Segmental and somatic dysfunction of cervical region: Secondary | ICD-10-CM | POA: Diagnosis not present

## 2013-12-20 DIAGNOSIS — M5137 Other intervertebral disc degeneration, lumbosacral region: Secondary | ICD-10-CM | POA: Diagnosis not present

## 2013-12-20 DIAGNOSIS — M9901 Segmental and somatic dysfunction of cervical region: Secondary | ICD-10-CM | POA: Diagnosis not present

## 2013-12-20 DIAGNOSIS — M9903 Segmental and somatic dysfunction of lumbar region: Secondary | ICD-10-CM | POA: Diagnosis not present

## 2013-12-20 DIAGNOSIS — M9902 Segmental and somatic dysfunction of thoracic region: Secondary | ICD-10-CM | POA: Diagnosis not present

## 2014-01-24 DIAGNOSIS — M9903 Segmental and somatic dysfunction of lumbar region: Secondary | ICD-10-CM | POA: Diagnosis not present

## 2014-01-24 DIAGNOSIS — M9901 Segmental and somatic dysfunction of cervical region: Secondary | ICD-10-CM | POA: Diagnosis not present

## 2014-01-24 DIAGNOSIS — M5137 Other intervertebral disc degeneration, lumbosacral region: Secondary | ICD-10-CM | POA: Diagnosis not present

## 2014-01-24 DIAGNOSIS — M9902 Segmental and somatic dysfunction of thoracic region: Secondary | ICD-10-CM | POA: Diagnosis not present

## 2014-02-28 DIAGNOSIS — M9901 Segmental and somatic dysfunction of cervical region: Secondary | ICD-10-CM | POA: Diagnosis not present

## 2014-02-28 DIAGNOSIS — M9903 Segmental and somatic dysfunction of lumbar region: Secondary | ICD-10-CM | POA: Diagnosis not present

## 2014-02-28 DIAGNOSIS — M9902 Segmental and somatic dysfunction of thoracic region: Secondary | ICD-10-CM | POA: Diagnosis not present

## 2014-02-28 DIAGNOSIS — M5137 Other intervertebral disc degeneration, lumbosacral region: Secondary | ICD-10-CM | POA: Diagnosis not present

## 2014-03-22 DIAGNOSIS — M9901 Segmental and somatic dysfunction of cervical region: Secondary | ICD-10-CM | POA: Diagnosis not present

## 2014-03-22 DIAGNOSIS — M5137 Other intervertebral disc degeneration, lumbosacral region: Secondary | ICD-10-CM | POA: Diagnosis not present

## 2014-03-22 DIAGNOSIS — M9903 Segmental and somatic dysfunction of lumbar region: Secondary | ICD-10-CM | POA: Diagnosis not present

## 2014-03-22 DIAGNOSIS — M9902 Segmental and somatic dysfunction of thoracic region: Secondary | ICD-10-CM | POA: Diagnosis not present

## 2014-03-28 DIAGNOSIS — K580 Irritable bowel syndrome with diarrhea: Secondary | ICD-10-CM | POA: Diagnosis not present

## 2014-03-28 DIAGNOSIS — E780 Pure hypercholesterolemia: Secondary | ICD-10-CM | POA: Diagnosis not present

## 2014-03-28 DIAGNOSIS — I10 Essential (primary) hypertension: Secondary | ICD-10-CM | POA: Diagnosis not present

## 2014-04-25 DIAGNOSIS — M9903 Segmental and somatic dysfunction of lumbar region: Secondary | ICD-10-CM | POA: Diagnosis not present

## 2014-04-25 DIAGNOSIS — M9901 Segmental and somatic dysfunction of cervical region: Secondary | ICD-10-CM | POA: Diagnosis not present

## 2014-04-25 DIAGNOSIS — M5137 Other intervertebral disc degeneration, lumbosacral region: Secondary | ICD-10-CM | POA: Diagnosis not present

## 2014-04-25 DIAGNOSIS — M9902 Segmental and somatic dysfunction of thoracic region: Secondary | ICD-10-CM | POA: Diagnosis not present

## 2014-05-30 DIAGNOSIS — M5137 Other intervertebral disc degeneration, lumbosacral region: Secondary | ICD-10-CM | POA: Diagnosis not present

## 2014-05-30 DIAGNOSIS — M9902 Segmental and somatic dysfunction of thoracic region: Secondary | ICD-10-CM | POA: Diagnosis not present

## 2014-05-30 DIAGNOSIS — M9903 Segmental and somatic dysfunction of lumbar region: Secondary | ICD-10-CM | POA: Diagnosis not present

## 2014-05-30 DIAGNOSIS — M9901 Segmental and somatic dysfunction of cervical region: Secondary | ICD-10-CM | POA: Diagnosis not present

## 2014-06-20 DIAGNOSIS — M9905 Segmental and somatic dysfunction of pelvic region: Secondary | ICD-10-CM | POA: Diagnosis not present

## 2014-06-20 DIAGNOSIS — M9904 Segmental and somatic dysfunction of sacral region: Secondary | ICD-10-CM | POA: Diagnosis not present

## 2014-06-20 DIAGNOSIS — M5030 Other cervical disc degeneration, unspecified cervical region: Secondary | ICD-10-CM | POA: Diagnosis not present

## 2014-06-20 DIAGNOSIS — M9902 Segmental and somatic dysfunction of thoracic region: Secondary | ICD-10-CM | POA: Diagnosis not present

## 2014-06-20 DIAGNOSIS — M9903 Segmental and somatic dysfunction of lumbar region: Secondary | ICD-10-CM | POA: Diagnosis not present

## 2014-06-20 DIAGNOSIS — M9901 Segmental and somatic dysfunction of cervical region: Secondary | ICD-10-CM | POA: Diagnosis not present

## 2014-08-22 DIAGNOSIS — M9905 Segmental and somatic dysfunction of pelvic region: Secondary | ICD-10-CM | POA: Diagnosis not present

## 2014-08-22 DIAGNOSIS — M9901 Segmental and somatic dysfunction of cervical region: Secondary | ICD-10-CM | POA: Diagnosis not present

## 2014-08-22 DIAGNOSIS — M9904 Segmental and somatic dysfunction of sacral region: Secondary | ICD-10-CM | POA: Diagnosis not present

## 2014-08-22 DIAGNOSIS — M9902 Segmental and somatic dysfunction of thoracic region: Secondary | ICD-10-CM | POA: Diagnosis not present

## 2014-08-22 DIAGNOSIS — M5137 Other intervertebral disc degeneration, lumbosacral region: Secondary | ICD-10-CM | POA: Diagnosis not present

## 2014-08-22 DIAGNOSIS — M9903 Segmental and somatic dysfunction of lumbar region: Secondary | ICD-10-CM | POA: Diagnosis not present

## 2014-10-01 DIAGNOSIS — G43909 Migraine, unspecified, not intractable, without status migrainosus: Secondary | ICD-10-CM | POA: Diagnosis not present

## 2014-10-01 DIAGNOSIS — J309 Allergic rhinitis, unspecified: Secondary | ICD-10-CM | POA: Diagnosis not present

## 2014-10-01 DIAGNOSIS — Z0000 Encounter for general adult medical examination without abnormal findings: Secondary | ICD-10-CM | POA: Diagnosis not present

## 2014-10-01 DIAGNOSIS — F39 Unspecified mood [affective] disorder: Secondary | ICD-10-CM | POA: Diagnosis not present

## 2014-10-01 DIAGNOSIS — I10 Essential (primary) hypertension: Secondary | ICD-10-CM | POA: Diagnosis not present

## 2014-10-01 DIAGNOSIS — M810 Age-related osteoporosis without current pathological fracture: Secondary | ICD-10-CM | POA: Diagnosis not present

## 2014-10-01 DIAGNOSIS — E780 Pure hypercholesterolemia: Secondary | ICD-10-CM | POA: Diagnosis not present

## 2014-10-01 DIAGNOSIS — Z23 Encounter for immunization: Secondary | ICD-10-CM | POA: Diagnosis not present

## 2014-10-01 DIAGNOSIS — K580 Irritable bowel syndrome with diarrhea: Secondary | ICD-10-CM | POA: Diagnosis not present

## 2014-10-03 DIAGNOSIS — M5137 Other intervertebral disc degeneration, lumbosacral region: Secondary | ICD-10-CM | POA: Diagnosis not present

## 2014-10-03 DIAGNOSIS — M9902 Segmental and somatic dysfunction of thoracic region: Secondary | ICD-10-CM | POA: Diagnosis not present

## 2014-10-03 DIAGNOSIS — M9904 Segmental and somatic dysfunction of sacral region: Secondary | ICD-10-CM | POA: Diagnosis not present

## 2014-10-03 DIAGNOSIS — M9905 Segmental and somatic dysfunction of pelvic region: Secondary | ICD-10-CM | POA: Diagnosis not present

## 2014-10-03 DIAGNOSIS — M9901 Segmental and somatic dysfunction of cervical region: Secondary | ICD-10-CM | POA: Diagnosis not present

## 2014-10-03 DIAGNOSIS — M9903 Segmental and somatic dysfunction of lumbar region: Secondary | ICD-10-CM | POA: Diagnosis not present

## 2014-10-11 DIAGNOSIS — H04123 Dry eye syndrome of bilateral lacrimal glands: Secondary | ICD-10-CM | POA: Diagnosis not present

## 2014-10-11 DIAGNOSIS — H2513 Age-related nuclear cataract, bilateral: Secondary | ICD-10-CM | POA: Diagnosis not present

## 2014-10-11 DIAGNOSIS — H5022 Vertical strabismus, left eye: Secondary | ICD-10-CM | POA: Diagnosis not present

## 2014-10-11 DIAGNOSIS — H532 Diplopia: Secondary | ICD-10-CM | POA: Diagnosis not present

## 2014-10-16 DIAGNOSIS — M8589 Other specified disorders of bone density and structure, multiple sites: Secondary | ICD-10-CM | POA: Diagnosis not present

## 2014-10-31 DIAGNOSIS — M9903 Segmental and somatic dysfunction of lumbar region: Secondary | ICD-10-CM | POA: Diagnosis not present

## 2014-10-31 DIAGNOSIS — M5137 Other intervertebral disc degeneration, lumbosacral region: Secondary | ICD-10-CM | POA: Diagnosis not present

## 2014-10-31 DIAGNOSIS — M9901 Segmental and somatic dysfunction of cervical region: Secondary | ICD-10-CM | POA: Diagnosis not present

## 2014-10-31 DIAGNOSIS — M9904 Segmental and somatic dysfunction of sacral region: Secondary | ICD-10-CM | POA: Diagnosis not present

## 2014-10-31 DIAGNOSIS — M9902 Segmental and somatic dysfunction of thoracic region: Secondary | ICD-10-CM | POA: Diagnosis not present

## 2014-10-31 DIAGNOSIS — M9905 Segmental and somatic dysfunction of pelvic region: Secondary | ICD-10-CM | POA: Diagnosis not present

## 2014-12-05 DIAGNOSIS — M9903 Segmental and somatic dysfunction of lumbar region: Secondary | ICD-10-CM | POA: Diagnosis not present

## 2014-12-05 DIAGNOSIS — M9905 Segmental and somatic dysfunction of pelvic region: Secondary | ICD-10-CM | POA: Diagnosis not present

## 2014-12-05 DIAGNOSIS — M9901 Segmental and somatic dysfunction of cervical region: Secondary | ICD-10-CM | POA: Diagnosis not present

## 2014-12-05 DIAGNOSIS — M5137 Other intervertebral disc degeneration, lumbosacral region: Secondary | ICD-10-CM | POA: Diagnosis not present

## 2014-12-05 DIAGNOSIS — M9902 Segmental and somatic dysfunction of thoracic region: Secondary | ICD-10-CM | POA: Diagnosis not present

## 2014-12-05 DIAGNOSIS — M9904 Segmental and somatic dysfunction of sacral region: Secondary | ICD-10-CM | POA: Diagnosis not present

## 2014-12-31 DIAGNOSIS — M9903 Segmental and somatic dysfunction of lumbar region: Secondary | ICD-10-CM | POA: Diagnosis not present

## 2014-12-31 DIAGNOSIS — M5430 Sciatica, unspecified side: Secondary | ICD-10-CM | POA: Diagnosis not present

## 2014-12-31 DIAGNOSIS — M9905 Segmental and somatic dysfunction of pelvic region: Secondary | ICD-10-CM | POA: Diagnosis not present

## 2014-12-31 DIAGNOSIS — M9901 Segmental and somatic dysfunction of cervical region: Secondary | ICD-10-CM | POA: Diagnosis not present

## 2014-12-31 DIAGNOSIS — M9904 Segmental and somatic dysfunction of sacral region: Secondary | ICD-10-CM | POA: Diagnosis not present

## 2014-12-31 DIAGNOSIS — M9902 Segmental and somatic dysfunction of thoracic region: Secondary | ICD-10-CM | POA: Diagnosis not present

## 2015-01-30 DIAGNOSIS — M5137 Other intervertebral disc degeneration, lumbosacral region: Secondary | ICD-10-CM | POA: Diagnosis not present

## 2015-01-30 DIAGNOSIS — M9903 Segmental and somatic dysfunction of lumbar region: Secondary | ICD-10-CM | POA: Diagnosis not present

## 2015-01-30 DIAGNOSIS — M9902 Segmental and somatic dysfunction of thoracic region: Secondary | ICD-10-CM | POA: Diagnosis not present

## 2015-01-30 DIAGNOSIS — M9901 Segmental and somatic dysfunction of cervical region: Secondary | ICD-10-CM | POA: Diagnosis not present

## 2015-02-27 DIAGNOSIS — M9901 Segmental and somatic dysfunction of cervical region: Secondary | ICD-10-CM | POA: Diagnosis not present

## 2015-02-27 DIAGNOSIS — M5137 Other intervertebral disc degeneration, lumbosacral region: Secondary | ICD-10-CM | POA: Diagnosis not present

## 2015-02-27 DIAGNOSIS — M9902 Segmental and somatic dysfunction of thoracic region: Secondary | ICD-10-CM | POA: Diagnosis not present

## 2015-02-27 DIAGNOSIS — M9903 Segmental and somatic dysfunction of lumbar region: Secondary | ICD-10-CM | POA: Diagnosis not present

## 2015-03-27 DIAGNOSIS — M5137 Other intervertebral disc degeneration, lumbosacral region: Secondary | ICD-10-CM | POA: Diagnosis not present

## 2015-03-27 DIAGNOSIS — M9902 Segmental and somatic dysfunction of thoracic region: Secondary | ICD-10-CM | POA: Diagnosis not present

## 2015-03-27 DIAGNOSIS — M9901 Segmental and somatic dysfunction of cervical region: Secondary | ICD-10-CM | POA: Diagnosis not present

## 2015-03-27 DIAGNOSIS — M9903 Segmental and somatic dysfunction of lumbar region: Secondary | ICD-10-CM | POA: Diagnosis not present

## 2015-04-12 DIAGNOSIS — M6530 Trigger finger, unspecified finger: Secondary | ICD-10-CM | POA: Diagnosis not present

## 2015-04-12 DIAGNOSIS — M1990 Unspecified osteoarthritis, unspecified site: Secondary | ICD-10-CM | POA: Diagnosis not present

## 2015-04-12 DIAGNOSIS — M810 Age-related osteoporosis without current pathological fracture: Secondary | ICD-10-CM | POA: Diagnosis not present

## 2015-04-12 DIAGNOSIS — E7800 Pure hypercholesterolemia, unspecified: Secondary | ICD-10-CM | POA: Diagnosis not present

## 2015-04-12 DIAGNOSIS — I10 Essential (primary) hypertension: Secondary | ICD-10-CM | POA: Diagnosis not present

## 2015-04-12 DIAGNOSIS — K580 Irritable bowel syndrome with diarrhea: Secondary | ICD-10-CM | POA: Diagnosis not present

## 2015-04-24 DIAGNOSIS — M9901 Segmental and somatic dysfunction of cervical region: Secondary | ICD-10-CM | POA: Diagnosis not present

## 2015-04-24 DIAGNOSIS — M5137 Other intervertebral disc degeneration, lumbosacral region: Secondary | ICD-10-CM | POA: Diagnosis not present

## 2015-04-24 DIAGNOSIS — M9902 Segmental and somatic dysfunction of thoracic region: Secondary | ICD-10-CM | POA: Diagnosis not present

## 2015-04-24 DIAGNOSIS — M9903 Segmental and somatic dysfunction of lumbar region: Secondary | ICD-10-CM | POA: Diagnosis not present

## 2015-06-26 DIAGNOSIS — M5137 Other intervertebral disc degeneration, lumbosacral region: Secondary | ICD-10-CM | POA: Diagnosis not present

## 2015-06-26 DIAGNOSIS — M9901 Segmental and somatic dysfunction of cervical region: Secondary | ICD-10-CM | POA: Diagnosis not present

## 2015-06-26 DIAGNOSIS — M9904 Segmental and somatic dysfunction of sacral region: Secondary | ICD-10-CM | POA: Diagnosis not present

## 2015-06-26 DIAGNOSIS — M9903 Segmental and somatic dysfunction of lumbar region: Secondary | ICD-10-CM | POA: Diagnosis not present

## 2015-06-26 DIAGNOSIS — M9905 Segmental and somatic dysfunction of pelvic region: Secondary | ICD-10-CM | POA: Diagnosis not present

## 2015-06-26 DIAGNOSIS — M9902 Segmental and somatic dysfunction of thoracic region: Secondary | ICD-10-CM | POA: Diagnosis not present

## 2015-08-28 DIAGNOSIS — M9903 Segmental and somatic dysfunction of lumbar region: Secondary | ICD-10-CM | POA: Diagnosis not present

## 2015-08-28 DIAGNOSIS — M9905 Segmental and somatic dysfunction of pelvic region: Secondary | ICD-10-CM | POA: Diagnosis not present

## 2015-08-28 DIAGNOSIS — M9902 Segmental and somatic dysfunction of thoracic region: Secondary | ICD-10-CM | POA: Diagnosis not present

## 2015-08-28 DIAGNOSIS — M9904 Segmental and somatic dysfunction of sacral region: Secondary | ICD-10-CM | POA: Diagnosis not present

## 2015-08-28 DIAGNOSIS — M5137 Other intervertebral disc degeneration, lumbosacral region: Secondary | ICD-10-CM | POA: Diagnosis not present

## 2015-08-28 DIAGNOSIS — M9901 Segmental and somatic dysfunction of cervical region: Secondary | ICD-10-CM | POA: Diagnosis not present

## 2015-09-11 DIAGNOSIS — K580 Irritable bowel syndrome with diarrhea: Secondary | ICD-10-CM | POA: Diagnosis not present

## 2015-09-16 ENCOUNTER — Encounter (HOSPITAL_COMMUNITY): Payer: Self-pay

## 2015-09-16 ENCOUNTER — Inpatient Hospital Stay (HOSPITAL_COMMUNITY)
Admission: EM | Admit: 2015-09-16 | Discharge: 2015-09-24 | DRG: 643 | Disposition: A | Discharge status: Home / Self Care | Payer: Medicare Other | Attending: Internal Medicine | Admitting: Internal Medicine

## 2015-09-16 DIAGNOSIS — K551 Chronic vascular disorders of intestine: Secondary | ICD-10-CM | POA: Diagnosis not present

## 2015-09-16 DIAGNOSIS — E43 Unspecified severe protein-calorie malnutrition: Secondary | ICD-10-CM | POA: Diagnosis not present

## 2015-09-16 DIAGNOSIS — E860 Dehydration: Secondary | ICD-10-CM | POA: Diagnosis present

## 2015-09-16 DIAGNOSIS — E869 Volume depletion, unspecified: Secondary | ICD-10-CM

## 2015-09-16 DIAGNOSIS — E876 Hypokalemia: Secondary | ICD-10-CM | POA: Diagnosis present

## 2015-09-16 DIAGNOSIS — I10 Essential (primary) hypertension: Secondary | ICD-10-CM | POA: Diagnosis present

## 2015-09-16 DIAGNOSIS — Z681 Body mass index (BMI) 19 or less, adult: Secondary | ICD-10-CM | POA: Diagnosis not present

## 2015-09-16 DIAGNOSIS — R410 Disorientation, unspecified: Secondary | ICD-10-CM | POA: Diagnosis not present

## 2015-09-16 DIAGNOSIS — E871 Hypo-osmolality and hyponatremia: Secondary | ICD-10-CM | POA: Diagnosis not present

## 2015-09-16 DIAGNOSIS — N3090 Cystitis, unspecified without hematuria: Secondary | ICD-10-CM | POA: Diagnosis not present

## 2015-09-16 DIAGNOSIS — E7800 Pure hypercholesterolemia, unspecified: Secondary | ICD-10-CM | POA: Diagnosis not present

## 2015-09-16 DIAGNOSIS — K219 Gastro-esophageal reflux disease without esophagitis: Secondary | ICD-10-CM | POA: Diagnosis present

## 2015-09-16 DIAGNOSIS — K589 Irritable bowel syndrome without diarrhea: Secondary | ICD-10-CM | POA: Diagnosis not present

## 2015-09-16 DIAGNOSIS — R309 Painful micturition, unspecified: Secondary | ICD-10-CM | POA: Diagnosis not present

## 2015-09-16 DIAGNOSIS — E559 Vitamin D deficiency, unspecified: Secondary | ICD-10-CM | POA: Diagnosis present

## 2015-09-16 DIAGNOSIS — E222 Syndrome of inappropriate secretion of antidiuretic hormone: Secondary | ICD-10-CM | POA: Diagnosis not present

## 2015-09-16 DIAGNOSIS — D649 Anemia, unspecified: Secondary | ICD-10-CM | POA: Diagnosis present

## 2015-09-16 DIAGNOSIS — R109 Unspecified abdominal pain: Secondary | ICD-10-CM | POA: Diagnosis not present

## 2015-09-16 HISTORY — DX: Pure hypercholesterolemia, unspecified: E78.00

## 2015-09-16 HISTORY — DX: Essential (primary) hypertension: I10

## 2015-09-16 HISTORY — DX: Irritable bowel syndrome, unspecified: K58.9

## 2015-09-16 LAB — I-STAT CHEM 8, ED
BUN: 13 mg/dL (ref 6–20)
Calcium, Ion: 0.98 mmol/L — ABNORMAL LOW (ref 1.12–1.23)
Chloride: 70 mmol/L — ABNORMAL LOW (ref 101–111)
Creatinine, Ser: 0.6 mg/dL (ref 0.44–1.00)
Glucose, Bld: 116 mg/dL — ABNORMAL HIGH (ref 65–99)
HCT: 45 % (ref 36.0–46.0)
Hemoglobin: 15.3 g/dL — ABNORMAL HIGH (ref 12.0–15.0)
Potassium: 4.2 mmol/L (ref 3.5–5.1)
Sodium: 105 mmol/L — CL (ref 135–145)
TCO2: 24 mmol/L (ref 0–100)

## 2015-09-16 MED ORDER — SODIUM CHLORIDE 0.9 % IV BOLUS (SEPSIS)
500.0000 mL | Freq: Once | INTRAVENOUS | Status: AC
  Administered 2015-09-16: 500 mL via INTRAVENOUS

## 2015-09-16 MED ORDER — SODIUM CHLORIDE 0.9 % IV SOLN
INTRAVENOUS | Status: DC
  Administered 2015-09-17 (×3): via INTRAVENOUS

## 2015-09-16 NOTE — ED Notes (Signed)
Per husband pt has been little disoriented, takes longer to say what she is thinking, increased weakness.

## 2015-09-16 NOTE — H&P (Signed)
[facility] History and Physical  Cynthia Bradshaw ZOX:[id number] DOB: 10/08/1945 DOA: 09/16/2015  Referring physician: Dr. Rubin PayorPickering PCP: No PCP Per Patient   Chief Complaint: Hyponatremia  HPI: Cynthia Boyerslizabeth A Chaisson is a 70 y.o. 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  She struggles with IBS and has for many years.  Recently over the past few months she has lost weight from 115 to 106 lbs.  Constipation and cramping, no sig diarrhea.  And poor appetite.  She began to feel bad, a little disoriented, but not confused. She went to see PCP today and labs showed low Na, she was sent here to ED where serum Na returned at 105 on iSTAT.  Has had poor appetite and began feeling disoriented, so went to PCP who did labs and serum Na was low.  Sent to ED today, and in ED Na is 105.  REgular labs are pending.  Asked to admit for hyponatremia.    Patient denies hx of this problem before.  No hospital stays recently.  Only ones in EPIC are for admits related to perforated stercoral ulcer in 2006 (see below).   Patient grew up in [place], moved to [place] 20 yrs ago.  She stayed home with 3 daughters.  Husband worked for the [employer] in administration and they moved 14 times during that job.  He also builds and sells houses.  He is retired now and rebuilding cars.  Pt doesn't smoke or drink.     Home meds > lipitor, cipro, Bentyl, digestive enzymes, Toprol-XL, MVI, zofran, Protonix  Chart review: Aug '06 - perforated stercoral ulcer, sepsis, ^LFT's, IBS, GERD> had Harman's pouch w colostomy Feb '07 - takedown colostomy and VH repair    Not eating and drinking much, no diarrhea,  Went to PCP  Na 105  K 4.2 CL 70 BUN 13 , Cr 0.6.  Looks dry.    Inpatient/ telemetry  ROS  denies CP  no joint pain   no HA  no blurry vision  no rash  no diarrhea  no nausea/ vomiting  no dysuria  no difficulty voiding  no change in urine color     Past Medical History  Past Medical History:  Diagnosis Date  . High cholesterol   . Hypertension   . IBS (irritable bowel syndrome)    Past Surgical History  Past Surgical History:  Procedure Laterality Date  . ABDOMINAL SURGERY    . COLON SURGERY     Family History No family history on file. Social History  reports that she has never smoked. She has never used smokeless tobacco. She reports that she does not drink alcohol or use drugs. Allergies No Known Allergies Home medications Prior to Admission medications   Medication Sig Start Date End Date Taking? Authorizing Provider  atorvastatin (LIPITOR) 20 MG tablet Take 20 mg by mouth daily.   Yes Historical Provider, MD  Calcium Carbonate (CALCIUM 600 PO) Take 1 tablet by mouth daily.   Yes Historical Provider, MD  cholecalciferol (VITAMIN D) 1000 units tablet Take 1,000 Units by mouth daily.   Yes Historical Provider, MD  ciprofloxacin (CIPRO) 250 MG tablet Take 250 mg by mouth every 12 (twelve) hours. 7 day course starting on 09/16/2015   Yes Historical Provider, MD  dicyclomine (BENTYL) 10 MG capsule Take 10 mg by mouth 4 (four) times daily as needed for spasms.   Yes Historical Provider, MD  Digestive Enzymes (DIGESTIVE ENZYME  PO) Take 1 capsule by mouth daily.   Yes Historical Provider, MD  GLUCOSAMINE-CHONDROITIN-MSM PO Take 1 capsule by mouth daily.   Yes Historical Provider, MD  metoprolol succinate (TOPROL-XL) 25 MG 24 hr tablet Take 25 mg by mouth daily.   Yes Historical Provider, MD  Multiple Vitamin (MULTIVITAMIN WITH MINERALS) TABS tablet Take 1 tablet by mouth daily.   Yes Historical Provider, MD  ondansetron (ZOFRAN) 4 MG tablet Take 4 mg by mouth 3 (three) times daily as needed for nausea or vomiting.   Yes Historical Provider, MD  pantoprazole (PROTONIX) 40 MG tablet Take 40 mg by mouth daily.   Yes Historical Provider, MD  Probiotic Product (DIGESTIVE ADVANTAGE) CAPS Take 1 capsule by mouth daily.   Yes Historical  Provider, MD   Liver Function Tests No results for input(s): AST, ALT, ALKPHOS, BILITOT, PROT, ALBUMIN in the last 168 hours. No results for input(s): LIPASE, AMYLASE in the last 168 hours. CBC  Recent Labs Lab 09/16/15 2044  HGB 15.3*  HCT 45.0   Basic Metabolic Panel  Recent Labs Lab 09/16/15 2044  NA 105*  K 4.2  CL 70*  GLUCOSE 116*  BUN 13  CREATININE 0.60     Vitals:   09/16/15 1959 09/16/15 2112  BP: 155/79 142/79  Pulse: 74 78  Resp: 18 17  Temp: 97.6 F (36.4 C)   TempSrc: Tympanic   SpO2: 97% 100%  Weight: 48.1 kg (106 lb)   Height: 5\' 4"  (1.626 m)    Exam: Gen alert no distress No rash, cyanosis or gangrene Sclera anicteric, throat clear and dry No jvd or bruit Chest clear bilat RRR 2/6 sem no RG Abd soft ntnd no mass or ascites +bs GU defer MS no joint effusions or deformity Ext no LE edema / no wounds or ulcers Neuro is alert, Ox 3 , nf   Na 105  K 4.2 Cl 70  Creat 0.60  BUN 13  Hb 15  Glu 116   Assessment: 1.  Hyponatremia - in setting of exacerbation of IBS, suspect this is hypovolemic.  Will send urine for osm/ Na/ Cr and start normal saline.  If worsens will need renal input.  She is minimally symptomatic but alert and Ox 3 today.   2.  Hx IBS 3.  HTN on beta-blocker, stable BP  Plan - NS bolus 500 cc then 100 cc/hr, check bmet every 4 hours for now.     Maree Krabbe [facility] Pager [phone]  Cell [phone]  If 7PM-7AM, please contact night-coverage [email or web address] Password [facility] 09/16/2015, 10:57 PM

## 2015-09-16 NOTE — ED Notes (Signed)
MD at the bedside to discuss plan with husband

## 2015-09-16 NOTE — ED Notes (Signed)
Report given to floor, pt been transported to room, husband at the bedside

## 2015-09-16 NOTE — ED Triage Notes (Signed)
Sodium level was down to 105 and the MD said to bring her here to have it rechecked.

## 2015-09-16 NOTE — ED Provider Notes (Signed)
[facility]Y DEPT Provider Note   CSN: [id number] Arrival date & time: 09/16/15  1932     History   Chief Complaint Chief Complaint  Patient presents with  . Abnormal Lab    HPI Cynthia Bradshaw is a 70 y.o. female.  The history is provided by the patient.  Patient was sent in for a recheck of hyponatremia. She was seen by her primary care doctor for slight disorientation and generalized weakness. She has had decreased oral intake. She has a history of irritable bowel syndrome has had previous bowel resection. Lab work reportedly showed a sodium 105. Also was diagnosed with urinary tract infection today and started on Cipro that she has not taken yet.  Past Medical History:  Diagnosis Date  . High cholesterol   . Hypertension   . IBS (irritable bowel syndrome)     There are no active problems to display for this patient.   Past Surgical History:  Procedure Laterality Date  . ABDOMINAL SURGERY    . COLON SURGERY      OB History    No data available       Home Medications    Prior to Admission medications   Medication Sig Start Date End Date Taking? Authorizing Provider  atorvastatin (LIPITOR) 20 MG tablet Take 20 mg by mouth daily.   Yes Historical Provider, MD  Calcium Carbonate (CALCIUM 600 PO) Take 1 tablet by mouth daily.   Yes Historical Provider, MD  cholecalciferol (VITAMIN D) 1000 units tablet Take 1,000 Units by mouth daily.   Yes Historical Provider, MD  ciprofloxacin (CIPRO) 250 MG tablet Take 250 mg by mouth every 12 (twelve) hours. 7 day course starting on 09/16/2015   Yes Historical Provider, MD  dicyclomine (BENTYL) 10 MG capsule Take 10 mg by mouth 4 (four) times daily as needed for spasms.   Yes Historical Provider, MD  Digestive Enzymes (DIGESTIVE ENZYME PO) Take 1 capsule by mouth daily.   Yes Historical Provider, MD  GLUCOSAMINE-CHONDROITIN-MSM PO Take 1 capsule by mouth daily.   Yes Historical Provider, MD  metoprolol succinate  (TOPROL-XL) 25 MG 24 hr tablet Take 25 mg by mouth daily.   Yes Historical Provider, MD  Multiple Vitamin (MULTIVITAMIN WITH MINERALS) TABS tablet Take 1 tablet by mouth daily.   Yes Historical Provider, MD  ondansetron (ZOFRAN) 4 MG tablet Take 4 mg by mouth 3 (three) times daily as needed for nausea or vomiting.   Yes Historical Provider, MD  pantoprazole (PROTONIX) 40 MG tablet Take 40 mg by mouth daily.   Yes Historical Provider, MD  Probiotic Product (DIGESTIVE ADVANTAGE) CAPS Take 1 capsule by mouth daily.   Yes Historical Provider, MD    Family History No family history on file.  Social History Social History  Substance Use Topics  . Smoking status: Never Smoker  . Smokeless tobacco: Never Used  . Alcohol use No     Allergies   Review of patient's allergies indicates no known allergies.   Review of Systems Review of Systems  Constitutional: Positive for appetite change and fatigue.  Eyes: Negative for photophobia.  Respiratory: Negative for shortness of breath.   Gastrointestinal: Positive for constipation and nausea.  Genitourinary: Negative for dyspareunia and hematuria.  Musculoskeletal: Negative for gait problem.  Skin: Negative for wound.  Neurological: Negative for speech difficulty.  Hematological: Negative for adenopathy.  Psychiatric/Behavioral: Negative for confusion.     Physical Exam Updated Vital Signs BP 142/79 (BP Location: Right Arm)   Pulse  78   Temp 97.6 F (36.4 C) (Tympanic)   Resp 17   Ht 5\' 4"  (1.626 m)   Wt 106 lb (48.1 kg)   SpO2 100%   BMI 18.19 kg/m   Physical Exam  Constitutional: She is oriented to person, place, and time. She appears well-developed.  Patient is very slender.  HENT:  Head: Normocephalic and atraumatic.  Eyes: Pupils are equal, round, and reactive to light.  Neck: Normal range of motion. Neck supple.  Cardiovascular: Normal rate, regular rhythm and normal heart sounds.   No murmur heard. Pulmonary/Chest:  Effort normal and breath sounds normal. No respiratory distress. She has no wheezes. She has no rales.  Abdominal: Soft. Bowel sounds are normal. She exhibits no distension. There is no tenderness. There is no rebound and no guarding.  Musculoskeletal: Normal range of motion.  Neurological: She is alert and oriented to person, place, and time. No cranial nerve deficit.  Skin: Skin is warm and dry.  Psychiatric: She has a normal mood and affect. Her speech is normal.  Nursing note and vitals reviewed.    ED Treatments / Results  Labs (all labs ordered are listed, but only abnormal results are displayed) Labs Reviewed  I-STAT CHEM 8, ED - Abnormal; Notable for the following:       Result Value   Sodium 105 (*)    Chloride 70 (*)    Glucose, Bld 116 (*)    Calcium, Ion 0.98 (*)    Hemoglobin 15.3 (*)    All other components within normal limits    EKG  EKG Interpretation None       Radiology No results found.  Procedures Procedures (including critical care time)  Medications Ordered in ED Medications - No data to display   Initial Impression / Assessment and Plan / ED Course  I have reviewed the triage vital signs and the nursing notes.  Pertinent labs & imaging results that were available during my care of the patient were reviewed by me and considered in my medical decision making (see chart for details).  Clinical Course    Patient sent in for sodium of 105. Rechecked and is hyponatremic. Has had decreased oral intake. Unsure if this is the cause or the effect of the hyponatremia. Will admit to internal medicine.  Final Clinical Impressions(s) / ED Diagnoses   Final diagnoses:  Hyponatremia    New Prescriptions New Prescriptions   No medications on file     Benjiman CoreNathan Kerry Odonohue, MD 09/16/15 2123

## 2015-09-17 ENCOUNTER — Observation Stay (HOSPITAL_COMMUNITY): Payer: Medicare Other

## 2015-09-17 DIAGNOSIS — E43 Unspecified severe protein-calorie malnutrition: Secondary | ICD-10-CM | POA: Insufficient documentation

## 2015-09-17 DIAGNOSIS — E860 Dehydration: Secondary | ICD-10-CM | POA: Diagnosis present

## 2015-09-17 DIAGNOSIS — E869 Volume depletion, unspecified: Secondary | ICD-10-CM | POA: Diagnosis not present

## 2015-09-17 DIAGNOSIS — E559 Vitamin D deficiency, unspecified: Secondary | ICD-10-CM | POA: Diagnosis present

## 2015-09-17 DIAGNOSIS — R410 Disorientation, unspecified: Secondary | ICD-10-CM | POA: Diagnosis not present

## 2015-09-17 DIAGNOSIS — K219 Gastro-esophageal reflux disease without esophagitis: Secondary | ICD-10-CM | POA: Diagnosis present

## 2015-09-17 DIAGNOSIS — E876 Hypokalemia: Secondary | ICD-10-CM | POA: Diagnosis present

## 2015-09-17 DIAGNOSIS — I10 Essential (primary) hypertension: Secondary | ICD-10-CM | POA: Diagnosis present

## 2015-09-17 DIAGNOSIS — E7800 Pure hypercholesterolemia, unspecified: Secondary | ICD-10-CM | POA: Diagnosis present

## 2015-09-17 DIAGNOSIS — K589 Irritable bowel syndrome without diarrhea: Secondary | ICD-10-CM | POA: Diagnosis not present

## 2015-09-17 DIAGNOSIS — E871 Hypo-osmolality and hyponatremia: Secondary | ICD-10-CM | POA: Diagnosis not present

## 2015-09-17 DIAGNOSIS — R112 Nausea with vomiting, unspecified: Secondary | ICD-10-CM | POA: Diagnosis not present

## 2015-09-17 DIAGNOSIS — E222 Syndrome of inappropriate secretion of antidiuretic hormone: Secondary | ICD-10-CM | POA: Diagnosis not present

## 2015-09-17 DIAGNOSIS — D649 Anemia, unspecified: Secondary | ICD-10-CM | POA: Diagnosis present

## 2015-09-17 DIAGNOSIS — Z681 Body mass index (BMI) 19 or less, adult: Secondary | ICD-10-CM | POA: Diagnosis not present

## 2015-09-17 LAB — BASIC METABOLIC PANEL
Anion gap: 10 (ref 5–15)
Anion gap: 10 (ref 5–15)
Anion gap: 11 (ref 5–15)
Anion gap: 11 (ref 5–15)
Anion gap: 12 (ref 5–15)
Anion gap: 15 (ref 5–15)
Anion gap: 5 (ref 5–15)
BUN: 10 mg/dL (ref 6–20)
BUN: 10 mg/dL (ref 6–20)
BUN: 12 mg/dL (ref 6–20)
BUN: 9 mg/dL (ref 6–20)
BUN: 9 mg/dL (ref 6–20)
BUN: 9 mg/dL (ref 6–20)
BUN: 9 mg/dL (ref 6–20)
CO2: 16 mmol/L — ABNORMAL LOW (ref 22–32)
CO2: 19 mmol/L — ABNORMAL LOW (ref 22–32)
CO2: 21 mmol/L — ABNORMAL LOW (ref 22–32)
CO2: 21 mmol/L — ABNORMAL LOW (ref 22–32)
CO2: 23 mmol/L (ref 22–32)
CO2: 23 mmol/L (ref 22–32)
CO2: 23 mmol/L (ref 22–32)
Calcium: 7.3 mg/dL — ABNORMAL LOW (ref 8.9–10.3)
Calcium: 7.7 mg/dL — ABNORMAL LOW (ref 8.9–10.3)
Calcium: 7.8 mg/dL — ABNORMAL LOW (ref 8.9–10.3)
Calcium: 8 mg/dL — ABNORMAL LOW (ref 8.9–10.3)
Calcium: 8.1 mg/dL — ABNORMAL LOW (ref 8.9–10.3)
Calcium: 8.1 mg/dL — ABNORMAL LOW (ref 8.9–10.3)
Calcium: 8.3 mg/dL — ABNORMAL LOW (ref 8.9–10.3)
Chloride: 72 mmol/L — ABNORMAL LOW (ref 101–111)
Chloride: 77 mmol/L — ABNORMAL LOW (ref 101–111)
Chloride: 78 mmol/L — ABNORMAL LOW (ref 101–111)
Chloride: 79 mmol/L — ABNORMAL LOW (ref 101–111)
Chloride: 80 mmol/L — ABNORMAL LOW (ref 101–111)
Chloride: 82 mmol/L — ABNORMAL LOW (ref 101–111)
Chloride: 84 mmol/L — ABNORMAL LOW (ref 101–111)
Creatinine, Ser: 0.49 mg/dL (ref 0.44–1.00)
Creatinine, Ser: 0.49 mg/dL (ref 0.44–1.00)
Creatinine, Ser: 0.49 mg/dL (ref 0.44–1.00)
Creatinine, Ser: 0.5 mg/dL (ref 0.44–1.00)
Creatinine, Ser: 0.51 mg/dL (ref 0.44–1.00)
Creatinine, Ser: 0.53 mg/dL (ref 0.44–1.00)
Creatinine, Ser: 0.54 mg/dL (ref 0.44–1.00)
GFR calc Af Amer: >60 mL/min (ref >60)
GFR calc Af Amer: >60 mL/min (ref >60)
GFR calc Af Amer: >60 mL/min (ref >60)
GFR calc Af Amer: >60 mL/min (ref >60)
GFR calc Af Amer: >60 mL/min (ref >60)
GFR calc Af Amer: >60 mL/min (ref >60)
GFR calc Af Amer: >60 mL/min (ref >60)
GFR calc non Af Amer: >60 mL/min (ref >60)
GFR calc non Af Amer: >60 mL/min (ref >60)
GFR calc non Af Amer: >60 mL/min (ref >60)
GFR calc non Af Amer: >60 mL/min (ref >60)
GFR calc non Af Amer: >60 mL/min (ref >60)
GFR calc non Af Amer: >60 mL/min (ref >60)
GFR calc non Af Amer: >60 mL/min (ref >60)
Glucose, Bld: 101 mg/dL — ABNORMAL HIGH (ref 65–99)
Glucose, Bld: 103 mg/dL — ABNORMAL HIGH (ref 65–99)
Glucose, Bld: 104 mg/dL — ABNORMAL HIGH (ref 65–99)
Glucose, Bld: 104 mg/dL — ABNORMAL HIGH (ref 65–99)
Glucose, Bld: 106 mg/dL — ABNORMAL HIGH (ref 65–99)
Glucose, Bld: 115 mg/dL — ABNORMAL HIGH (ref 65–99)
Glucose, Bld: 88 mg/dL (ref 65–99)
Potassium: 3.4 mmol/L — ABNORMAL LOW (ref 3.5–5.1)
Potassium: 3.5 mmol/L (ref 3.5–5.1)
Potassium: 3.7 mmol/L (ref 3.5–5.1)
Potassium: 3.8 mmol/L (ref 3.5–5.1)
Potassium: 3.9 mmol/L (ref 3.5–5.1)
Potassium: 4.1 mmol/L (ref 3.5–5.1)
Potassium: 4.2 mmol/L (ref 3.5–5.1)
Sodium: 105 mmol/L — CL (ref 135–145)
Sodium: 105 mmol/L — CL (ref 135–145)
Sodium: 111 mmol/L — CL (ref 135–145)
Sodium: 112 mmol/L — CL (ref 135–145)
Sodium: 112 mmol/L — CL (ref 135–145)
Sodium: 113 mmol/L — CL (ref 135–145)
Sodium: 114 mmol/L — CL (ref 135–145)

## 2015-09-17 LAB — HEPATIC FUNCTION PANEL
ALT: 20 U/L (ref 14–54)
AST: 25 U/L (ref 15–41)
Albumin: 3.5 g/dL (ref 3.5–5.0)
Alkaline Phosphatase: 65 U/L (ref 38–126)
Bilirubin, Direct: 0.1 mg/dL (ref 0.1–0.5)
Indirect Bilirubin: 0.6 mg/dL (ref 0.3–0.9)
Total Bilirubin: 0.7 mg/dL (ref 0.3–1.2)
Total Protein: 6.5 g/dL (ref 6.5–8.1)

## 2015-09-17 LAB — CBC
HCT: 34.1 % — ABNORMAL LOW (ref 36.0–46.0)
Hemoglobin: 12.7 g/dL (ref 12.0–15.0)
MCH: 30.7 pg (ref 26.0–34.0)
MCHC: 37 g/dL — ABNORMAL HIGH (ref 30.0–36.0)
MCV: 82.4 fL (ref 78.0–100.0)
Platelets: 427 10*3/uL — ABNORMAL HIGH (ref 150–400)
RBC: 4.14 MIL/uL (ref 3.87–5.11)
RDW: 11.4 % — ABNORMAL LOW (ref 11.5–15.5)
WBC: 11.9 10*3/uL — ABNORMAL HIGH (ref 4.0–10.5)

## 2015-09-17 LAB — URINALYSIS, ROUTINE W REFLEX MICROSCOPIC
Bilirubin Urine: NEGATIVE
Glucose, UA: NEGATIVE mg/dL
Ketones, ur: NEGATIVE mg/dL
Nitrite: NEGATIVE
Specific Gravity, Urine: 1.01 (ref 1.005–1.030)
pH: 6.5 (ref 5.0–8.0)

## 2015-09-17 LAB — URINE MICROSCOPIC-ADD ON

## 2015-09-17 LAB — CORTISOL-AM, BLOOD: Cortisol - AM: 22.6 ug/dL (ref 6.7–22.6)

## 2015-09-17 LAB — TSH: TSH: 1.219 u[IU]/mL (ref 0.350–4.500)

## 2015-09-17 LAB — CREATININE, URINE, RANDOM: Creatinine, Urine: 92.45 mg/dL

## 2015-09-17 LAB — OSMOLALITY, URINE: Osmolality, Ur: 565 mOsm/kg (ref 300–900)

## 2015-09-17 LAB — SODIUM, URINE, RANDOM: Sodium, Ur: 37 mmol/L

## 2015-09-17 MED ORDER — SODIUM CHLORIDE 1 G PO TABS
1.0000 g | ORAL_TABLET | Freq: Once | ORAL | Status: AC
  Administered 2015-09-17: 1 g via ORAL
  Filled 2015-09-17: qty 1

## 2015-09-17 MED ORDER — ONDANSETRON HCL 4 MG/2ML IJ SOLN: 4.0000 mg | Freq: Four times a day (QID) | INTRAMUSCULAR | Status: DC | PRN

## 2015-09-17 MED ORDER — ADULT MULTIVITAMIN W/MINERALS CH
1.0000 | ORAL_TABLET | Freq: Every day | ORAL | Status: DC
  Administered 2015-09-17 – 2015-09-24 (×8): 1 via ORAL
  Filled 2015-09-17 (×8): qty 1

## 2015-09-17 MED ORDER — ENOXAPARIN SODIUM 40 MG/0.4ML ~~LOC~~ SOLN
40.0000 mg | SUBCUTANEOUS | Status: DC
  Administered 2015-09-17 – 2015-09-23 (×8): 40 mg via SUBCUTANEOUS
  Filled 2015-09-17 (×8): qty 0.4

## 2015-09-17 MED ORDER — POTASSIUM CHLORIDE CRYS ER 20 MEQ PO TBCR
40.0000 meq | EXTENDED_RELEASE_TABLET | Freq: Once | ORAL | Status: AC
  Administered 2015-09-17: 40 meq via ORAL
  Filled 2015-09-17: qty 2

## 2015-09-17 MED ORDER — DICYCLOMINE HCL 10 MG PO CAPS
10.0000 mg | ORAL_CAPSULE | Freq: Four times a day (QID) | ORAL | Status: DC | PRN
  Administered 2015-09-17 – 2015-09-18 (×4): 10 mg via ORAL
  Filled 2015-09-17 (×4): qty 1

## 2015-09-17 MED ORDER — METOPROLOL SUCCINATE ER 25 MG PO TB24
25.0000 mg | ORAL_TABLET | Freq: Every day | ORAL | Status: DC
  Administered 2015-09-17 – 2015-09-24 (×8): 25 mg via ORAL
  Filled 2015-09-17 (×9): qty 1

## 2015-09-17 MED ORDER — ACETAMINOPHEN 650 MG RE SUPP: 650.0000 mg | Freq: Four times a day (QID) | RECTAL | Status: DC | PRN

## 2015-09-17 MED ORDER — PROMETHAZINE HCL 25 MG/ML IJ SOLN
12.5000 mg | Freq: Four times a day (QID) | INTRAMUSCULAR | Status: DC | PRN
  Administered 2015-09-17: 12.5 mg via INTRAVENOUS
  Filled 2015-09-17: qty 1

## 2015-09-17 MED ORDER — RISAQUAD PO CAPS
1.0000 | ORAL_CAPSULE | Freq: Every day | ORAL | Status: DC
  Administered 2015-09-17 – 2015-09-24 (×8): 1 via ORAL
  Filled 2015-09-17 (×8): qty 1

## 2015-09-17 MED ORDER — ACETAMINOPHEN 325 MG PO TABS
650.0000 mg | ORAL_TABLET | Freq: Four times a day (QID) | ORAL | Status: DC | PRN
  Administered 2015-09-17: 650 mg via ORAL
  Filled 2015-09-17: qty 2

## 2015-09-17 MED ORDER — BISACODYL 10 MG RE SUPP: 10.0000 mg | Freq: Every day | RECTAL | Status: DC | PRN

## 2015-09-17 MED ORDER — PANTOPRAZOLE SODIUM 40 MG PO TBEC
40.0000 mg | DELAYED_RELEASE_TABLET | Freq: Every day | ORAL | Status: DC
  Administered 2015-09-17 – 2015-09-24 (×8): 40 mg via ORAL
  Filled 2015-09-17 (×8): qty 1

## 2015-09-17 MED ORDER — ATORVASTATIN CALCIUM 20 MG PO TABS
20.0000 mg | ORAL_TABLET | Freq: Every day | ORAL | Status: DC
  Administered 2015-09-17 – 2015-09-24 (×8): 20 mg via ORAL
  Filled 2015-09-17 (×8): qty 1

## 2015-09-17 MED ORDER — BOOST / RESOURCE BREEZE PO LIQD
1.0000 | Freq: Three times a day (TID) | ORAL | Status: DC
  Administered 2015-09-17 – 2015-09-23 (×11): 1 via ORAL

## 2015-09-17 MED ORDER — ONDANSETRON HCL 4 MG PO TABS
4.0000 mg | ORAL_TABLET | Freq: Four times a day (QID) | ORAL | Status: DC | PRN
  Administered 2015-09-17 – 2015-09-20 (×4): 4 mg via ORAL
  Filled 2015-09-17 (×4): qty 1

## 2015-09-17 MED ORDER — SODIUM CHLORIDE 0.9% FLUSH
3.0000 mL | Freq: Two times a day (BID) | INTRAVENOUS | Status: DC
  Administered 2015-09-17 – 2015-09-24 (×9): 3 mL via INTRAVENOUS

## 2015-09-17 MED ORDER — VITAMIN D 1000 UNITS PO TABS
1000.0000 [IU] | ORAL_TABLET | Freq: Every day | ORAL | Status: DC
  Administered 2015-09-17 – 2015-09-24 (×8): 1000 [IU] via ORAL
  Filled 2015-09-17 (×8): qty 1

## 2015-09-17 NOTE — Progress Notes (Signed)
CRITICAL VALUE ALERT  Critical value received:  Na 112  Date of notification:  09/17/2015  Time of notification:  1641  Critical value read back:Yes.    Nurse who received alert:  Bronson Curb RN  MD notified (1st page):  Dr. Malachi Bonds  Time of first page:  1644  MD notified (2nd page):  Time of second page:  Responding MD:  Dr. Malachi Bonds  Time MD responded:  [phone]

## 2015-09-17 NOTE — Progress Notes (Signed)
Initial Nutrition Assessment  DOCUMENTATION CODES:   Severe malnutrition in context of acute illness/injury    INTERVENTION:  Boost Breeze po TID, each supplement provides 250 kcal and 9 grams of protein   Obtained meal preferences for tonight and dietary staff is to work with her daily each meal   NUTRITION DIAGNOSIS:   Malnutrition related to altered GI function, acute illness (nausea and vomiting) hyponatremia as evidenced by moderate depletion of body fat, moderate depletions of muscle mass.   GOAL:   Patient will meet greater than or equal to 90% of their needs, Weight gain   MONITOR:   PO intake, Supplement acceptance, Labs, Weight trends  REASON FOR ASSESSMENT:   Consult Assessment of nutrition requirement/status  ASSESSMENT:   Patient has hx of IBS and complains of chronic abdominal pain and cramping and has gradually eliminated many foods from her diet. She was volume depleted on admission and remains confused. Patient says that her MD recently suggested she try to begin adding back some regular type foods.  She has had very little to eat for more than a week per pt and husband.  Today at lunch she attempted rice, grilled chicken and green beans. She was able to eat 50% of the rice and a few bites of chicken but then vomited. She likes the [employer] and is sipping on those.  Diet hx: avoids dairy, drinks ProNourish ([place]), 1/2 ham sandwich, mashed potatoes, bananas, rice.      Nutrition-Focused physical exam completed. Findings are moderate fat depletion, moderate muscle depletion, and no edema.     Recent Labs Lab 09/17/15 0325 09/17/15 0745 09/17/15 1116  NA 105* 111* 112*  K 3.5 3.4* 4.2  CL 77* 78* 79*  CO2 23 23 23   BUN 10 9 9   CREATININE 0.51 0.49 0.49  CALCIUM 7.3* 7.8* 8.0*  GLUCOSE 115* 88 104*    Labs: hyponatremia   Diet Order:  Diet gluten free Room service appropriate? Yes; Fluid consistency: Thin; Fluid restriction: 1200 mL  Fluid  Skin:  Reviewed, no issues  Last BM:  8/28  Height:   Ht Readings from Last 1 Encounters:  09/16/15 5\' 4"  (1.626 m)    Weight:   Wt Readings from Last 1 Encounters:  09/17/15 109 lb 2 oz (49.5 kg)    Ideal Body Weight:  55 kg  BMI:  Body mass index is 18.73 kg/m. borderline underweight  Estimated Nutritional Needs:   Kcal:  1500-1750 (to prevent further weight loss)  Protein:  77-75 gr   Fluid:  1.2 liters daily per MD  EDUCATION NEEDS:   Education needs no appropriate at this time  Royann Shivers MS,RD,CSG,LDN Office: [phone] Pager: [phone]

## 2015-09-17 NOTE — Progress Notes (Signed)
Critical lab value received. Sodium level 105. Dr. Sharl Ma had been notified.

## 2015-09-17 NOTE — Progress Notes (Signed)
CRITICAL VALUE ALERT  Critical value received:  Na 111  Date of notification:  09/17/15  Time of notification:  0957  Critical value read back:Yes.    Nurse who received alert:  Dagoberto Ligas, RN  MD notified (1st page):  Dr. Malachi Bonds  Time of first page:  1023  MD notified (2nd page):  Time of second page:  Responding MD:  Dr. Malachi Bonds  Time MD responded:

## 2015-09-17 NOTE — Progress Notes (Signed)
CRITICAL VALUE ALERT  Critical value received:  Na 112  Date of notification:  09/17/15  Time of notification:  1154  Critical value read back:Yes.    Nurse who received alert:  Dagoberto Ligas, RN  MD notified (1st page):  Dr. Malachi Bonds  Time of first page:  1215  MD notified (2nd page):  Time of second page:  Responding MD:  Dr. Malachi Bonds  Time MD responded:  [phone]

## 2015-09-17 NOTE — Progress Notes (Addendum)
PROGRESS NOTE  Cynthia Bradshaw  KDX:[id number] DOB: [birth year] DOA: 09/16/2015 PCP: No PCP Per Patient  Brief Narrative:   Cynthia Bradshaw is a 70 y.o. 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  Over the past few months she has lost 9-lbs because she is afraid to eat.  When she eats, she develops abdominal pain and cramping.  She developed mild confusion and went to see her PCP who labs showed low Na so she was referred to the ER.  Suspect combination of dehydration and tea and toast diet.  Sodium gradually trending up with IVF and free water restriction.  Her IBS symptoms may improve once her sodium normalizes, however, if she is unable to tolerate adequate PO/hydration, she may need GI consultation or further work up for IBS symptoms.    Assessment & Plan:   Principal Problem:   Hyponatremia Active Problems:   Volume depletion   IBS (irritable bowel syndrome)  Hyponatremia, likely due to dehydration and "tea and toast" diet.  Her nausea may also be contributing.  Low suspicion for heart failure and cirrhosis.  Will eval for hypothyroidism, adrenal insufficiency and screen for lung mass (SIADH), however, FENa and urine osms are fairly high, consistent with dehydration.   -  TSH:  wnl -  Cortisol level -  CXR:  No mass -  Continue IVF -  Sodium chloride 1gm once -  Aim for correction of less than over 24 hours -  Neuro check q4h -  Seizure precautions -  Continue zofran and add phenergan -  Continue BMP q4h  IBS, ongoing abdominal pains -  Continue prn bentyl  Hypertension, BP stable on beta blocker  Severe protein calorie malnutrition -  Liberalize diet as much as possible -  supplements  DVT prophylaxis:  lovenox Code Status:  full Family Communication:  Patient and her husband by phone Disposition Plan:  Pending correction of her hyponatremia, tolerating diet   Consultants:   none  Procedures:    none  Antimicrobials:   none    Subjective: Feeling a little better but still having nausea.  Thinking is a little clearer.  Still afraid to eat and has many dietary restrictions which she continues to want to adhere to.    Objective: Vitals:   09/16/15 2112 09/16/15 2347 09/17/15 0623 09/17/15 1300  BP: 142/79 (!) 162/82 (!) 142/66 (!) 141/72  Pulse: 78 66 79 75  Resp: 17 18 18 18   Temp:  97.5 F (36.4 C) 97.8 F (36.6 C) 97.8 F (36.6 C)  TempSrc:  Oral Oral Oral  SpO2: 100% 100% 100% 99%  Weight:  51 kg (112 lb 7 oz) 49.5 kg (109 lb 2 oz)   Height:  5\' 4"  (1.626 m)     No intake or output data in the 24 hours ending 09/17/15 1446 Filed Weights   09/16/15 1959 09/16/15 2347 09/17/15 0623  Weight: 48.1 kg (106 lb) 51 kg (112 lb 7 oz) 49.5 kg (109 lb 2 oz)    Examination:  General exam:  Adult female.  No acute distress.  HEENT:  NCAT, MMM Respiratory system: Clear to auscultation bilaterally Cardiovascular system: Regular rate and rhythm, normal S1/S2. No murmurs, rubs, gallops or clicks.  Warm extremities Gastrointestinal system:  Normacl active bowel sounds, soft, nondistended, mildly TTP diffusely without rebound or guarding. MSK:  Normal tone and bulk, no lower extremity edema Neuro:  Grossly intact  Data Reviewed: I have personally reviewed following labs and imaging studies  CBC:  Recent Labs Lab 09/16/15 2044 09/16/15 2355  WBC  --  11.9*  HGB 15.3* 12.7  HCT 45.0 34.1*  MCV  --  82.4  PLT  --  427*   Basic Metabolic Panel:  Recent Labs Lab 09/16/15 2044 09/16/15 2355 09/17/15 0325 09/17/15 0745 09/17/15 1116  NA 105* 105* 105* 111* 112*  K 4.2 3.8 3.5 3.4* 4.2  CL 70* 72* 77* 78* 79*  CO2  --  21* 23 23 23   GLUCOSE 116* 103* 115* 88 104*  BUN 13 12 10 9 9   CREATININE 0.60 0.49 0.51 0.49 0.49  CALCIUM  --  8.3* 7.3* 7.8* 8.0*   GFR: Estimated Creatinine Clearance: 51.9 mL/min (by C-G formula based on SCr of 0.8 mg/dL). Liver  Function Tests:  Recent Labs Lab 09/16/15 2355  AST 25  ALT 20  ALKPHOS 65  BILITOT 0.7  PROT 6.5  ALBUMIN 3.5   No results for input(s): LIPASE, AMYLASE in the last 168 hours. No results for input(s): AMMONIA in the last 168 hours. Coagulation Profile: No results for input(s): INR, PROTIME in the last 168 hours. Cardiac Enzymes: No results for input(s): CKTOTAL, CKMB, CKMBINDEX, TROPONINI in the last 168 hours. BNP (last 3 results) No results for input(s): PROBNP in the last 8760 hours. HbA1C: No results for input(s): HGBA1C in the last 72 hours. CBG: No results for input(s): GLUCAP in the last 168 hours. Lipid Profile: No results for input(s): CHOL, HDL, LDLCALC, TRIG, CHOLHDL, LDLDIRECT in the last 72 hours. Thyroid Function Tests:  Recent Labs  09/17/15 1116  TSH 1.219   Anemia Panel: No results for input(s): VITAMINB12, FOLATE, FERRITIN, TIBC, IRON, RETICCTPCT in the last 72 hours. Urine analysis:    Component Value Date/Time   COLORURINE YELLOW 09/16/2015 2300   APPEARANCEUR CLEAR 09/16/2015 2300   LABSPEC 1.010 09/16/2015 2300   PHURINE 6.5 09/16/2015 2300   GLUCOSEU NEGATIVE 09/16/2015 2300   HGBUR TRACE (A) 09/16/2015 2300   BILIRUBINUR NEGATIVE 09/16/2015 2300   KETONESUR NEGATIVE 09/16/2015 2300   PROTEINUR TRACE (A) 09/16/2015 2300   NITRITE NEGATIVE 09/16/2015 2300   LEUKOCYTESUR TRACE (A) 09/16/2015 2300   Sepsis Labs: @LABRCNTIP (procalcitonin:4,lacticidven:4)  )No results found for this or any previous visit (from the past 240 hour(s)).    Radiology Studies: Dg Chest 2 View  Result Date: 09/17/2015 CLINICAL DATA:  70 year old female with nausea and vomiting for 9 days. Hyponatremia. Initial encounter. EXAM: CHEST  2 VIEW COMPARISON:  CT Abdomen and Pelvis 08/21/2004. FINDINGS: Mild to moderate thoracolumbar scoliosis. Osteopenia. No acute osseous abnormality identified. Lung volumes are within normal limits. Normal cardiac size and mediastinal  contours. Visualized tracheal air column is within normal limits. No pneumothorax, pulmonary edema, pleural effusion or confluent pulmonary opacity. Negative visible bowel gas pattern. IMPRESSION: No acute cardiopulmonary abnormality. Electronically Signed   By: Odessa Fleming M.D.   On: 09/17/2015 13:27     Scheduled Meds: . acidophilus  1 capsule Oral Daily  . atorvastatin  20 mg Oral Daily  . cholecalciferol  1,000 Units Oral Daily  . enoxaparin (LOVENOX) injection  40 mg Subcutaneous Q24H  . feeding supplement  1 Container Oral TID BM  . metoprolol succinate  25 mg Oral Daily  . multivitamin with minerals  1 tablet Oral Daily  . pantoprazole  40 mg Oral Daily  . sodium chloride flush  3 mL Intravenous Q12H   Continuous Infusions: .  sodium chloride 100 mL/hr at 09/17/15 0905     LOS: 1 day    Time spent: 30 min    Renae FickleSHORT, Harpreet Signore, MD [facility] Pager [phone]  If 7PM-7AM, please contact night-coverage [email or web address] Password [email or web address] 09/17/2015, 2:46 PM

## 2015-09-17 NOTE — Care Management Note (Signed)
Case Management Note  Patient Details  Name: Cynthia Bradshaw MRN: [id number] Date of Birth: Sep 03, 1945  Subjective/Objective:                  Pt admitted with hyponatremia. She is from home and ind with ADL's. Family is at bedside. She has a PCP, transportation and has insurance with drug coverage. She plans to return home with self care.   Action/Plan: No CM needs anticipated.   Expected Discharge Date:  09/18/15               Expected Discharge Plan:  Home/Self Care  In-House Referral:  NA  Discharge planning Services  CM Consult  Post Acute Care Choice:  NA Choice offered to:  NA  DME Arranged:    DME Agency:     HH Arranged:    HH Agency:     Status of Service:  Completed, signed off  If discussed at [employer] of Stay Meetings, dates discussed:    Additional Comments:  Malcolm Metro, RN 09/17/2015, 2:55 PM

## 2015-09-17 NOTE — Progress Notes (Signed)
Order received from Dr. Sharl Ma regarding critical lab value of sodium 105.

## 2015-09-17 NOTE — Care Management Obs Status (Signed)
MEDICARE OBSERVATION STATUS NOTIFICATION   Patient Details  Name: Cynthia Bradshaw MRN: [id number] Date of Birth: 09/08/1945   Medicare Observation Status Notification Given:       Malcolm Metro, RN 09/17/2015, 3:00 PM

## 2015-09-17 NOTE — Progress Notes (Signed)
Critical lab value received. Sodium level 105. Dr. Lama had been notified. 

## 2015-09-18 DIAGNOSIS — E871 Hypo-osmolality and hyponatremia: Secondary | ICD-10-CM

## 2015-09-18 DIAGNOSIS — E43 Unspecified severe protein-calorie malnutrition: Secondary | ICD-10-CM

## 2015-09-18 DIAGNOSIS — E222 Syndrome of inappropriate secretion of antidiuretic hormone: Principal | ICD-10-CM

## 2015-09-18 DIAGNOSIS — K589 Irritable bowel syndrome without diarrhea: Secondary | ICD-10-CM

## 2015-09-18 LAB — CBC
HCT: 35.4 % — ABNORMAL LOW (ref 36.0–46.0)
Hemoglobin: 12.8 g/dL (ref 12.0–15.0)
MCH: 30.8 pg (ref 26.0–34.0)
MCHC: 36.2 g/dL — ABNORMAL HIGH (ref 30.0–36.0)
MCV: 85.1 fL (ref 78.0–100.0)
Platelets: 316 10*3/uL (ref 150–400)
RBC: 4.16 MIL/uL (ref 3.87–5.11)
RDW: 11.7 % (ref 11.5–15.5)
WBC: 9 10*3/uL (ref 4.0–10.5)

## 2015-09-18 LAB — BASIC METABOLIC PANEL
Anion gap: 11 (ref 5–15)
BUN: 10 mg/dL (ref 6–20)
CO2: 18 mmol/L — ABNORMAL LOW (ref 22–32)
Calcium: 7.8 mg/dL — ABNORMAL LOW (ref 8.9–10.3)
Chloride: 87 mmol/L — ABNORMAL LOW (ref 101–111)
Creatinine, Ser: 0.42 mg/dL — ABNORMAL LOW (ref 0.44–1.00)
GFR calc Af Amer: >60 mL/min (ref >60)
GFR calc non Af Amer: >60 mL/min (ref >60)
Glucose, Bld: 83 mg/dL (ref 65–99)
Potassium: 3.8 mmol/L (ref 3.5–5.1)
Sodium: 116 mmol/L — CL (ref 135–145)

## 2015-09-18 LAB — SODIUM
Sodium: 118 mmol/L — CL (ref 135–145)
Sodium: 120 mmol/L — ABNORMAL LOW (ref 135–145)
Sodium: 118 mmol/L — CL (ref 135–145)

## 2015-09-18 MED ORDER — LOPERAMIDE HCL 2 MG PO CAPS
4.0000 mg | ORAL_CAPSULE | Freq: Once | ORAL | Status: AC
  Administered 2015-09-18: 4 mg via ORAL
  Filled 2015-09-18: qty 2

## 2015-09-18 MED ORDER — BUSPIRONE HCL 5 MG PO TABS
5.0000 mg | ORAL_TABLET | Freq: Two times a day (BID) | ORAL | Status: DC
  Administered 2015-09-18 – 2015-09-24 (×13): 5 mg via ORAL
  Filled 2015-09-18 (×13): qty 1

## 2015-09-18 NOTE — Progress Notes (Signed)
CRITICAL VALUE ALERT  Critical value received:  Sodium =114  Date of notification:  09/18/15  Time of notification:  0005  Critical value read back:Yes.    Nurse who received alert:  Jinny Sanders, RN  MD notified (1st page):    Time of first page:    MD notified (2nd page):  Time of second page:  Responding MD:    Time MD responded:  Reported value is higher than previous critical low value.  MD requested no stop notification for increase in level.

## 2015-09-18 NOTE — Progress Notes (Signed)
PROGRESS NOTE  OCEOLA Bradshaw  ITG:[id number] DOB: 1945-01-23 DOA: 09/16/2015 PCP: No PCP Per Patient  Brief Narrative:   Cynthia Bradshaw is a 70 y.o. 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  Over the past few months she has lost 9-lbs because she is afraid to eat.  When she eats, she develops abdominal pain and cramping.  She developed mild confusion and went to see her PCP who labs showed low Na so she was referred to the ER.  Suspect combination of dehydration and tea and toast diet.  Sodium gradually trending up with IVF and free water restriction.  Her IBS symptoms may improve once her sodium normalizes, however, if she is unable to tolerate adequate PO/hydration, she may need GI consultation or further work up for IBS symptoms.    Assessment & Plan:   Principal Problem:   Hyponatremia Active Problems:   Volume depletion   IBS (irritable bowel syndrome)   Protein-calorie malnutrition, severe  Hyponatremia, suspected SIADH -  TSH:  wnl -  Cortisol level -  CXR:  No mass -  Sodium chloride 1gm once -  Seizure precautions -  Continue zofran and add phenergan - On further questioning today, patient reports frequently drinking large amounts of free water on a daily basis prior to admission (around 3-5 L per day) - Patient does have mildly elevated urine sodium as well as elevated urine osmol suggesting SIADH. Patient is clinically euvolemic on my exam. Patient reports voiding frequently since being on IV fluids during this hospital admission. - Agree with continued fluid restricted diet. We'll discontinue IV fluids for now. - Sodium is improving, currently 120. - Every 4 hours sodium levels ordered, continue to follow  IBS, ongoing abdominal pains -  Continue prn bentyl  Hypertension, BP stable on beta blocker  Severe protein calorie malnutrition -  Liberalize diet as much as possible -  supplements  DVT  prophylaxis:  lovenox Code Status:  full Family Communication:  Patient and her husband at bedside Disposition Plan:  Possible DC home in 24-48 hours   Consultants:   none  Procedures:  none  Antimicrobials:   none    Subjective: Patient reports feeling well today. Questioning about going home. Reports voiding very frequently while on IV fluids.  Objective: Vitals:   09/17/15 0623 09/17/15 1300 09/17/15 2036 09/18/15 0625  BP: (!) 142/66 (!) 141/72 (!) 155/70 (!) 162/72  Pulse: 79 75 74 82  Resp: 18 18 18 18   Temp: 97.8 F (36.6 C) 97.8 F (36.6 C) 98.4 F (36.9 C) 97.4 F (36.3 C)  TempSrc: Oral Oral Oral Oral  SpO2: 100% 99% 100% 100%  Weight: 49.5 kg (109 lb 2 oz)   50.2 kg (110 lb 10.7 oz)  Height:        Intake/Output Summary (Last 24 hours) at 09/18/15 1538 Last data filed at 09/18/15 1537  Gross per 24 hour  Intake          2686.67 ml  Output             2951 ml  Net          -264.33 ml   Filed Weights   09/16/15 2347 09/17/15 0623 09/18/15 0625  Weight: 51 kg (112 lb 7 oz) 49.5 kg (109 lb 2 oz) 50.2 kg (110 lb 10.7 oz)    Examination:  General exam:  Adult female.  No acute distress, Laying in bed  HEENT:  NCAT, mucous membranes are moist Respiratory system: Clear to auscultation bilaterally Cardiovascular system: Regular rate and rhythm, normal S1/S2. No murmurs, rubs, gallops or clicks.  Warm extremities Gastrointestinal system:  Normacl active bowel sounds, soft, nondistended, mildly TTP diffusely without rebound or guarding. MSK:  Normal tone and bulk, no lower extremity edema Neuro:  Grossly intact, no tremors Skin: Normal skin turgor, no rashes    Data Reviewed: I have personally reviewed following labs and imaging studies  CBC:  Recent Labs Lab 09/16/15 2044 09/16/15 2355 09/18/15 0500  WBC  --  11.9* 9.0  HGB 15.3* 12.7 12.8  HCT 45.0 34.1* 35.4*  MCV  --  82.4 85.1  PLT  --  427* 316   Basic Metabolic Panel:  Recent  Labs Lab 09/17/15 1116 09/17/15 1531 09/17/15 1947 09/17/15 2322 09/18/15 0337 09/18/15 0756 09/18/15 1245  NA 112* 112* 113* 114* 116* 118* 120*  K 4.2 3.9 4.1 3.7 3.8  --   --   CL 79* 80* 82* 84* 87*  --   --   CO2 23 21* 16* 19* 18*  --   --   GLUCOSE 104* 106* 104* 101* 83  --   --   BUN 9 9 9 10 10   --   --   CREATININE 0.49 0.54 0.50 0.53 0.42*  --   --   CALCIUM 8.0* 8.1* 8.1* 7.7* 7.8*  --   --    GFR: Estimated Creatinine Clearance: 52.6 mL/min (by C-G formula based on SCr of 0.8 mg/dL). Liver Function Tests:  Recent Labs Lab 09/16/15 2355  AST 25  ALT 20  ALKPHOS 65  BILITOT 0.7  PROT 6.5  ALBUMIN 3.5   No results for input(s): LIPASE, AMYLASE in the last 168 hours. No results for input(s): AMMONIA in the last 168 hours. Coagulation Profile: No results for input(s): INR, PROTIME in the last 168 hours. Cardiac Enzymes: No results for input(s): CKTOTAL, CKMB, CKMBINDEX, TROPONINI in the last 168 hours. BNP (last 3 results) No results for input(s): PROBNP in the last 8760 hours. HbA1C: No results for input(s): HGBA1C in the last 72 hours. CBG: No results for input(s): GLUCAP in the last 168 hours. Lipid Profile: No results for input(s): CHOL, HDL, LDLCALC, TRIG, CHOLHDL, LDLDIRECT in the last 72 hours. Thyroid Function Tests:  Recent Labs  09/17/15 1116  TSH 1.219   Anemia Panel: No results for input(s): VITAMINB12, FOLATE, FERRITIN, TIBC, IRON, RETICCTPCT in the last 72 hours. Urine analysis:    Component Value Date/Time   COLORURINE YELLOW 09/16/2015 2300   APPEARANCEUR CLEAR 09/16/2015 2300   LABSPEC 1.010 09/16/2015 2300   PHURINE 6.5 09/16/2015 2300   GLUCOSEU NEGATIVE 09/16/2015 2300   HGBUR TRACE (A) 09/16/2015 2300   BILIRUBINUR NEGATIVE 09/16/2015 2300   KETONESUR NEGATIVE 09/16/2015 2300   PROTEINUR TRACE (A) 09/16/2015 2300   NITRITE NEGATIVE 09/16/2015 2300   LEUKOCYTESUR TRACE (A) 09/16/2015 2300   Sepsis  Labs: @LABRCNTIP (procalcitonin:4,lacticidven:4)  )No results found for this or any previous visit (from the past 240 hour(s)).    Radiology Studies: Dg Chest 2 View  Result Date: 09/17/2015 CLINICAL DATA:  70 year old female with nausea and vomiting for 9 days. Hyponatremia. Initial encounter. EXAM: CHEST  2 VIEW COMPARISON:  CT Abdomen and Pelvis 08/21/2004. FINDINGS: Mild to moderate thoracolumbar scoliosis. Osteopenia. No acute osseous abnormality identified. Lung volumes are within normal limits. Normal cardiac size and mediastinal contours. Visualized tracheal air column is within normal limits. No pneumothorax,  pulmonary edema, pleural effusion or confluent pulmonary opacity. Negative visible bowel gas pattern. IMPRESSION: No acute cardiopulmonary abnormality. Electronically Signed   By: Odessa Fleming M.D.   On: 09/17/2015 13:27     Scheduled Meds: . acidophilus  1 capsule Oral Daily  . atorvastatin  20 mg Oral Daily  . busPIRone  5 mg Oral BID  . cholecalciferol  1,000 Units Oral Daily  . enoxaparin (LOVENOX) injection  40 mg Subcutaneous Q24H  . feeding supplement  1 Container Oral TID BM  . metoprolol succinate  25 mg Oral Daily  . multivitamin with minerals  1 tablet Oral Daily  . pantoprazole  40 mg Oral Daily  . sodium chloride flush  3 mL Intravenous Q12H   Continuous Infusions: . sodium chloride 100 mL/hr at 09/17/15 1832     LOS: 2 days    Time spent: 30 min    Brion Sossamon, Scheryl Marten, MD [facility] Pager [phone]  If 7PM-7AM, please contact night-coverage [email or web address] Password [facility] 09/18/2015, 3:38 PM

## 2015-09-18 NOTE — Progress Notes (Signed)
CRITICAL VALUE ALERT  Critical value received:  Na 118  Date of notification:  09/18/15  Time of notification:  0800  Critical value read back:Yes.    Nurse who received alert:  Dagoberto Ligas, RN  MD notified (1st page):  Dr. Rhona Leavens  Time of first page:  1030  MD notified (2nd page):  Time of second page:  Responding MD:  Dr. Rhona Leavens  Time MD responded:

## 2015-09-18 NOTE — Progress Notes (Signed)
CRITICAL VALUE ALERT  Critical value received:  Sodium =113  Date of notification:  09/17/15  Time of notification:  2100  Critical value read back:Yes.    Nurse who received alert:  Jinny Sanders, RN  MD notified (1st page):    Time of first page:    MD notified (2nd page):  Time of second page:  Responding MD:    Time MD responded:  Reported value is higher than previous critical low value.  MD requested to stop notification for increase in level.

## 2015-09-18 NOTE — Progress Notes (Signed)
CRITICAL VALUE ALERT  Critical value received:  Na=116  Date of notification:  09/18/15  Time of notification:  0410  Critical value read back:Yes.    Nurse who received alert:  Jinny Sanders, RN  MD notified (1st page):  Dr. Sharl Ma  Time of first page:  0445  MD notified (2nd page):  Time of second page:  Responding MD:  Dr. Sharl Ma  Time MD responded:  No new orders given.

## 2015-09-18 NOTE — Care Management Important Message (Signed)
Important Message  Patient Details  Name: Cynthia Bradshaw MRN: [id number] Date of Birth: [birth year]   Medicare Important Message Given:  Yes    Malcolm Metro, RN 09/18/2015, 3:19 PM

## 2015-09-19 DIAGNOSIS — E869 Volume depletion, unspecified: Secondary | ICD-10-CM

## 2015-09-19 LAB — CBC
HCT: 34.6 % — ABNORMAL LOW (ref 36.0–46.0)
Hemoglobin: 12.5 g/dL (ref 12.0–15.0)
MCH: 30.5 pg (ref 26.0–34.0)
MCHC: 36.1 g/dL — ABNORMAL HIGH (ref 30.0–36.0)
MCV: 84.4 fL (ref 78.0–100.0)
Platelets: 445 10*3/uL — ABNORMAL HIGH (ref 150–400)
RBC: 4.1 MIL/uL (ref 3.87–5.11)
RDW: 11.8 % (ref 11.5–15.5)
WBC: 9.7 10*3/uL (ref 4.0–10.5)

## 2015-09-19 LAB — SODIUM
Sodium: 119 mmol/L — CL (ref 135–145)
Sodium: 117 mmol/L — CL (ref 135–145)
Sodium: 119 mmol/L — CL (ref 135–145)

## 2015-09-19 LAB — BASIC METABOLIC PANEL
Anion gap: 10 (ref 5–15)
BUN: 9 mg/dL (ref 6–20)
CO2: 25 mmol/L (ref 22–32)
Calcium: 8.1 mg/dL — ABNORMAL LOW (ref 8.9–10.3)
Chloride: 83 mmol/L — ABNORMAL LOW (ref 101–111)
Creatinine, Ser: 0.52 mg/dL (ref 0.44–1.00)
GFR calc Af Amer: >60 mL/min (ref >60)
GFR calc non Af Amer: >60 mL/min (ref >60)
Glucose, Bld: 89 mg/dL (ref 65–99)
Potassium: 3.6 mmol/L (ref 3.5–5.1)
Sodium: 118 mmol/L — CL (ref 135–145)

## 2015-09-19 MED ORDER — SODIUM CHLORIDE 0.9 % IV SOLN
INTRAVENOUS | Status: DC
  Administered 2015-09-19 – 2015-09-21 (×5): via INTRAVENOUS

## 2015-09-19 NOTE — Progress Notes (Signed)
CRITICAL VALUE ALERT  Critical value received:  Na 117  Date of notification:  09/19/2015  Time of notification: 1530  Critical value read back:Yes  Nurse who received alert: Karl Luke, RN  MD notified (1st page):  Dr. Rhona Leavens  Time of first page: 1542  MD notified (2nd page):  Time of second page:  Responding MD:    Time MD responded:

## 2015-09-19 NOTE — Progress Notes (Signed)
PROGRESS NOTE  Cynthia Bradshaw  ZOX:[id number] DOB: 1945/05/04 DOA: 09/16/2015 PCP: No PCP Per Patient  Brief Narrative:   Cynthia Bradshaw is a 70 y.o. 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  Over the past few months she has lost 9-lbs because she is afraid to eat.  When she eats, she develops abdominal pain and cramping.  She developed mild confusion and went to see her PCP who labs showed low Na so she was referred to the ER.  Suspect combination of dehydration and tea and toast diet.  Sodium gradually trending up with IVF and free water restriction.  Her IBS symptoms may improve once her sodium normalizes, however, if she is unable to tolerate adequate PO/hydration, she may need GI consultation or further work up for IBS symptoms.    Assessment & Plan:   Principal Problem:   Hyponatremia Active Problems:   Volume depletion   IBS (irritable bowel syndrome)   Protein-calorie malnutrition, severe  Hyponatremia, suspected SIADH -  TSH:  wnl -  Cortisol level -  CXR:  No mass -  Patient continued on zofran and add phenergan - On further questioning today, patient reports frequently drinking large amounts of free water on a daily basis prior to admission (around 3-5 L per day) - Patient does have mildly elevated urine sodium as well as elevated urine osmol suggesting SIADH. Patient is clinically euvolemic on my exam. Patient reports voiding frequently since being on IV fluids during this hospital admission. - Agree with continued fluid restricted diet.  - Sodium decreased to 118 this AM. Resumed  - Sodium is slowly improving - Will resume q4h sodium checks  IBS, ongoing abdominal pains -  Continue prn bentyl  Hypertension, BP remains stable on beta blocker  Severe protein calorie malnutrition -  Continue diet as tolerated -  supplements  DVT prophylaxis:  lovenox Code Status:  full Family Communication:   Patient in room Disposition Plan:  Possible DC home in 24-48 hours   Consultants:   none  Procedures:  none  Antimicrobials:   none    Subjective: No complaints this AM. Questioning about going home  Objective: Vitals:   09/17/15 2036 09/18/15 0625 09/18/15 2106 09/19/15 0541  BP: (!) 155/70 (!) 162/72 (!) 147/51 100/79  Pulse: 74 82 69 71  Resp: 18 18 20 20   Temp: 98.4 F (36.9 C) 97.4 F (36.3 C) 97.9 F (36.6 C) 98.3 F (36.8 C)  TempSrc: Oral Oral Oral Oral  SpO2: 100% 100% 100% 100%  Weight:  50.2 kg (110 lb 10.7 oz)    Height:        Intake/Output Summary (Last 24 hours) at 09/19/15 1523 Last data filed at 09/19/15 0816  Gross per 24 hour  Intake           949.67 ml  Output             1525 ml  Net          -575.33 ml   Filed Weights   09/16/15 2347 09/17/15 0623 09/18/15 0625  Weight: 51 kg (112 lb 7 oz) 49.5 kg (109 lb 2 oz) 50.2 kg (110 lb 10.7 oz)    Examination:  General exam:  Adult female.  No acute distress, Laying in bed, pleasant, conversant HEENT:  NCAT, mucous membranes are moist Respiratory system: Clear to auscultation bilaterally Cardiovascular system: Regular rate and rhythm, normal S1/S2. No murmurs, rubs, gallops  or clicks.  Warm extremities Gastrointestinal system:  Normacl active bowel sounds, soft, nondistended, mildly TTP diffusely without rebound or guarding. MSK:  Normal tone and bulk, no lower extremity edema Neuro:  Grossly intact, no tremors Skin: Normal skin turgor, no rashes    Data Reviewed: I have personally reviewed following labs and imaging studies  CBC:  Recent Labs Lab 09/16/15 2044 09/16/15 2355 09/18/15 0500 09/19/15 0553  WBC  --  11.9* 9.0 9.7  HGB 15.3* 12.7 12.8 12.5  HCT 45.0 34.1* 35.4* 34.6*  MCV  --  82.4 85.1 84.4  PLT  --  427* 316 445*   Basic Metabolic Panel:  Recent Labs Lab 09/17/15 1531 09/17/15 1947 09/17/15 2322 09/18/15 0337  09/18/15 1245 09/18/15 1612 09/19/15 0553  09/19/15 0949 09/19/15 1354  NA 112* 113* 114* 116*  < > 120* 118* 118* 119* 117*  K 3.9 4.1 3.7 3.8  --   --   --  3.6  --   --   CL 80* 82* 84* 87*  --   --   --  83*  --   --   CO2 21* 16* 19* 18*  --   --   --  25  --   --   GLUCOSE 106* 104* 101* 83  --   --   --  89  --   --   BUN 9 9 10 10   --   --   --  9  --   --   CREATININE 0.54 0.50 0.53 0.42*  --   --   --  0.52  --   --   CALCIUM 8.1* 8.1* 7.7* 7.8*  --   --   --  8.1*  --   --   < > = values in this interval not displayed. GFR: Estimated Creatinine Clearance: 52.6 mL/min (by C-G formula based on SCr of 0.8 mg/dL). Liver Function Tests:  Recent Labs Lab 09/16/15 2355  AST 25  ALT 20  ALKPHOS 65  BILITOT 0.7  PROT 6.5  ALBUMIN 3.5   No results for input(s): LIPASE, AMYLASE in the last 168 hours. No results for input(s): AMMONIA in the last 168 hours. Coagulation Profile: No results for input(s): INR, PROTIME in the last 168 hours. Cardiac Enzymes: No results for input(s): CKTOTAL, CKMB, CKMBINDEX, TROPONINI in the last 168 hours. BNP (last 3 results) No results for input(s): PROBNP in the last 8760 hours. HbA1C: No results for input(s): HGBA1C in the last 72 hours. CBG: No results for input(s): GLUCAP in the last 168 hours. Lipid Profile: No results for input(s): CHOL, HDL, LDLCALC, TRIG, CHOLHDL, LDLDIRECT in the last 72 hours. Thyroid Function Tests:  Recent Labs  09/17/15 1116  TSH 1.219   Anemia Panel: No results for input(s): VITAMINB12, FOLATE, FERRITIN, TIBC, IRON, RETICCTPCT in the last 72 hours. Urine analysis:    Component Value Date/Time   COLORURINE YELLOW 09/16/2015 2300   APPEARANCEUR CLEAR 09/16/2015 2300   LABSPEC 1.010 09/16/2015 2300   PHURINE 6.5 09/16/2015 2300   GLUCOSEU NEGATIVE 09/16/2015 2300   HGBUR TRACE (A) 09/16/2015 2300   BILIRUBINUR NEGATIVE 09/16/2015 2300   KETONESUR NEGATIVE 09/16/2015 2300   PROTEINUR TRACE (A) 09/16/2015 2300   NITRITE NEGATIVE 09/16/2015  2300   LEUKOCYTESUR TRACE (A) 09/16/2015 2300   Sepsis Labs: @LABRCNTIP (procalcitonin:4,lacticidven:4)  )No results found for this or any previous visit (from the past 240 hour(s)).    Radiology Studies: No results found.   Scheduled  Meds: . acidophilus  1 capsule Oral Daily  . atorvastatin  20 mg Oral Daily  . busPIRone  5 mg Oral BID  . cholecalciferol  1,000 Units Oral Daily  . enoxaparin (LOVENOX) injection  40 mg Subcutaneous Q24H  . feeding supplement  1 Container Oral TID BM  . metoprolol succinate  25 mg Oral Daily  . multivitamin with minerals  1 tablet Oral Daily  . pantoprazole  40 mg Oral Daily  . sodium chloride flush  3 mL Intravenous Q12H   Continuous Infusions: . sodium chloride 100 mL/hr at 09/19/15 0816     LOS: 3 days    Time spent: 30 min    Jakeira Seeman, Scheryl MartenSTEPHEN K, MD [facility] Pager [phone]  If 7PM-7AM, please contact night-coverage [email or web address] Password [email or web address] 09/19/2015, 3:23 PM

## 2015-09-20 LAB — BASIC METABOLIC PANEL
Anion gap: 10 (ref 5–15)
BUN: 9 mg/dL (ref 6–20)
CO2: 22 mmol/L (ref 22–32)
Calcium: 7.8 mg/dL — ABNORMAL LOW (ref 8.9–10.3)
Chloride: 90 mmol/L — ABNORMAL LOW (ref 101–111)
Creatinine, Ser: 0.45 mg/dL (ref 0.44–1.00)
GFR calc Af Amer: >60 mL/min (ref >60)
GFR calc non Af Amer: >60 mL/min (ref >60)
Glucose, Bld: 75 mg/dL (ref 65–99)
Potassium: 3.1 mmol/L — ABNORMAL LOW (ref 3.5–5.1)
Sodium: 122 mmol/L — ABNORMAL LOW (ref 135–145)

## 2015-09-20 LAB — SODIUM
Sodium: 121 mmol/L — ABNORMAL LOW (ref 135–145)
Sodium: 121 mmol/L — ABNORMAL LOW (ref 135–145)

## 2015-09-20 LAB — CBC
HCT: 32.8 % — ABNORMAL LOW (ref 36.0–46.0)
Hemoglobin: 11.8 g/dL — ABNORMAL LOW (ref 12.0–15.0)
MCH: 30.6 pg (ref 26.0–34.0)
MCHC: 36 g/dL (ref 30.0–36.0)
MCV: 85 fL (ref 78.0–100.0)
Platelets: 415 10*3/uL — ABNORMAL HIGH (ref 150–400)
RBC: 3.86 MIL/uL — ABNORMAL LOW (ref 3.87–5.11)
RDW: 12 % (ref 11.5–15.5)
WBC: 10.2 10*3/uL (ref 4.0–10.5)

## 2015-09-20 MED ORDER — FUROSEMIDE 20 MG PO TABS
20.0000 mg | ORAL_TABLET | Freq: Two times a day (BID) | ORAL | Status: DC
  Administered 2015-09-20 – 2015-09-24 (×8): 20 mg via ORAL
  Filled 2015-09-20 (×8): qty 1

## 2015-09-20 MED ORDER — POTASSIUM CHLORIDE CRYS ER 20 MEQ PO TBCR
40.0000 meq | EXTENDED_RELEASE_TABLET | Freq: Two times a day (BID) | ORAL | Status: AC
  Administered 2015-09-20 (×2): 40 meq via ORAL
  Filled 2015-09-20 (×2): qty 2

## 2015-09-20 MED ORDER — SODIUM CHLORIDE 1 G PO TABS
1.0000 g | ORAL_TABLET | Freq: Three times a day (TID) | ORAL | Status: DC
  Administered 2015-09-20 (×2): 1 g via ORAL
  Filled 2015-09-20 (×10): qty 1

## 2015-09-20 NOTE — Progress Notes (Signed)
PROGRESS NOTE  Cynthia Bradshaw  UXL:[id number] DOB: [birth year] DOA: 09/16/2015 PCP: No PCP Per Patient  Brief Narrative:   Cynthia Bradshaw is a 70 y.o. 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  Over the past few months she has lost 9-lbs because she is afraid to eat.  When she eats, she develops abdominal pain and cramping.  She developed mild confusion and went to see her PCP who labs showed low Na so she was referred to the ER.  Suspect combination of dehydration and tea and toast diet.  Sodium gradually trending up with IVF and free water restriction.  Her IBS symptoms may improve once her sodium normalizes, however, if she is unable to tolerate adequate PO/hydration, she may need GI consultation or further work up for IBS symptoms.    Assessment & Plan:   Principal Problem:   Hyponatremia Active Problems:   Volume depletion   IBS (irritable bowel syndrome)   Protein-calorie malnutrition, severe  Hyponatremia, suspected SIADH -  TSH:  wnl -  Cortisol level -  CXR:  No mass -  Patient continued on zofran and add phenergan - On further questioning today, patient reports frequently drinking large amounts of free water on a daily basis prior to admission (around 3-5 L per day) - Patient does have mildly elevated urine sodium as well as elevated urine osmol suggesting SIADH. Patient is clinically euvolemic on my exam.  - Continue fluid restricted diet.  - Sodium increased to 122 - Sodium is slowly improving - Will continue q4h sodium checks - Will start sodium tablets  IBS, ongoing abdominal pains -  Continue prn bentyl  Hypertension, BP remains stable on beta blocker  Severe protein calorie malnutrition -  Continue diet as tolerated -  supplements  DVT prophylaxis:  lovenox Code Status:  full Family Communication:  Patient in room Disposition Plan:  Possible DC home in 24-48 hours   Consultants:    none  Procedures:  none  Antimicrobials:   none    Subjective: Denies sob or chest pain. Eager to go home  Objective: Vitals:   09/18/15 2106 09/19/15 0541 09/19/15 2326 09/20/15 0613  BP: (!) 147/51 100/79 (!) 155/79 (!) 143/75  Pulse: 69 71 64 85  Resp: 20 20 20 20   Temp: 97.9 F (36.6 C) 98.3 F (36.8 C) 98.1 F (36.7 C) 97.8 F (36.6 C)  TempSrc: Oral Oral Oral Oral  SpO2: 100% 100% 100% 100%  Weight:    48.7 kg (107 lb 6.4 oz)  Height:    5\' 4"  (1.626 m)    Intake/Output Summary (Last 24 hours) at 09/20/15 1302 Last data filed at 09/20/15 0635  Gross per 24 hour  Intake          2591.66 ml  Output             2100 ml  Net           491.66 ml   Filed Weights   09/17/15 0623 09/18/15 0625 09/20/15 [id number]  Weight: 49.5 kg (109 lb 2 oz) 50.2 kg (110 lb 10.7 oz) 48.7 kg (107 lb 6.4 oz)    Examination:  General exam:  Adult female.  No acute distress, Laying in bed, conversant HEENT:  NCAT, mucous membranes are moist Respiratory system: Clear to auscultation bilaterally Cardiovascular system: Regular rate and rhythm, normal S1/S2. Warm extremities Gastrointestinal system:  Normal bowel sounds, soft, nondistended, mildly TTP diffusely without  rebound or guarding. MSK:  Normal tone and bulk, no lower extremity edema Neuro:  Grossly intact, no tremors Skin: Normal skin turgor, no rashes    Data Reviewed: I have personally reviewed following labs and imaging studies  CBC:  Recent Labs Lab 09/16/15 2044 09/16/15 2355 09/18/15 0500 09/19/15 0553 09/20/15 0613  WBC  --  11.9* 9.0 9.7 10.2  HGB 15.3* 12.7 12.8 12.5 11.8*  HCT 45.0 34.1* 35.4* 34.6* 32.8*  MCV  --  82.4 85.1 84.4 85.0  PLT  --  427* 316 445* 415*   Basic Metabolic Panel:  Recent Labs Lab 09/17/15 1947 09/17/15 2322 09/18/15 0337  09/19/15 0553 09/19/15 0949 09/19/15 1354 09/19/15 1909 09/20/15 0613  NA 113* 114* 116*  < > 118* 119* 117* 119* 122*  K 4.1 3.7 3.8  --  3.6  --    --   --  3.1*  CL 82* 84* 87*  --  83*  --   --   --  90*  CO2 16* 19* 18*  --  25  --   --   --  22  GLUCOSE 104* 101* 83  --  89  --   --   --  75  BUN 9 10 10   --  9  --   --   --  9  CREATININE 0.50 0.53 0.42*  --  0.52  --   --   --  0.45  CALCIUM 8.1* 7.7* 7.8*  --  8.1*  --   --   --  7.8*  < > = values in this interval not displayed. GFR: Estimated Creatinine Clearance: 51 mL/min (by C-G formula based on SCr of 0.8 mg/dL). Liver Function Tests:  Recent Labs Lab 09/16/15 2355  AST 25  ALT 20  ALKPHOS 65  BILITOT 0.7  PROT 6.5  ALBUMIN 3.5   No results for input(s): LIPASE, AMYLASE in the last 168 hours. No results for input(s): AMMONIA in the last 168 hours. Coagulation Profile: No results for input(s): INR, PROTIME in the last 168 hours. Cardiac Enzymes: No results for input(s): CKTOTAL, CKMB, CKMBINDEX, TROPONINI in the last 168 hours. BNP (last 3 results) No results for input(s): PROBNP in the last 8760 hours. HbA1C: No results for input(s): HGBA1C in the last 72 hours. CBG: No results for input(s): GLUCAP in the last 168 hours. Lipid Profile: No results for input(s): CHOL, HDL, LDLCALC, TRIG, CHOLHDL, LDLDIRECT in the last 72 hours. Thyroid Function Tests: No results for input(s): TSH, T4TOTAL, FREET4, T3FREE, THYROIDAB in the last 72 hours. Anemia Panel: No results for input(s): VITAMINB12, FOLATE, FERRITIN, TIBC, IRON, RETICCTPCT in the last 72 hours. Urine analysis:    Component Value Date/Time   COLORURINE YELLOW 09/16/2015 2300   APPEARANCEUR CLEAR 09/16/2015 2300   LABSPEC 1.010 09/16/2015 2300   PHURINE 6.5 09/16/2015 2300   GLUCOSEU NEGATIVE 09/16/2015 2300   HGBUR TRACE (A) 09/16/2015 2300   BILIRUBINUR NEGATIVE 09/16/2015 2300   KETONESUR NEGATIVE 09/16/2015 2300   PROTEINUR TRACE (A) 09/16/2015 2300   NITRITE NEGATIVE 09/16/2015 2300   LEUKOCYTESUR TRACE (A) 09/16/2015 2300   Sepsis Labs: @LABRCNTIP (procalcitonin:4,lacticidven:4)  )No  results found for this or any previous visit (from the past 240 hour(s)).    Radiology Studies: No results found.   Scheduled Meds: . acidophilus  1 capsule Oral Daily  . atorvastatin  20 mg Oral Daily  . busPIRone  5 mg Oral BID  . cholecalciferol  1,000 Units Oral Daily  .  enoxaparin (LOVENOX) injection  40 mg Subcutaneous Q24H  . feeding supplement  1 Container Oral TID BM  . metoprolol succinate  25 mg Oral Daily  . multivitamin with minerals  1 tablet Oral Daily  . pantoprazole  40 mg Oral Daily  . potassium chloride  40 mEq Oral BID  . sodium chloride flush  3 mL Intravenous Q12H  . sodium chloride  1 g Oral TID WC   Continuous Infusions: . sodium chloride 100 mL/hr at 09/20/15 0442     LOS: 4 days   Time spent: 30 min  Stanislaus Kaltenbach, Scheryl Marten, MD [facility] Pager [phone]  If 7PM-7AM, please contact night-coverage [email or web address] Password [email or web address] 09/20/2015, 1:02 PM

## 2015-09-20 NOTE — Care Management Important Message (Signed)
Important Message  Patient Details  Name: Cynthia Bradshaw MRN: [id number] Date of Birth: [birth year]   Medicare Important Message Given:  Yes    Malcolm Metro, RN 09/20/2015, 9:32 AM

## 2015-09-21 LAB — BASIC METABOLIC PANEL
Anion gap: 11 (ref 5–15)
BUN: 7 mg/dL (ref 6–20)
CO2: 23 mmol/L (ref 22–32)
Calcium: 7.7 mg/dL — ABNORMAL LOW (ref 8.9–10.3)
Chloride: 86 mmol/L — ABNORMAL LOW (ref 101–111)
Creatinine, Ser: 0.54 mg/dL (ref 0.44–1.00)
GFR calc Af Amer: >60 mL/min (ref >60)
GFR calc non Af Amer: >60 mL/min (ref >60)
Glucose, Bld: 81 mg/dL (ref 65–99)
Potassium: 3.3 mmol/L — ABNORMAL LOW (ref 3.5–5.1)
Sodium: 120 mmol/L — ABNORMAL LOW (ref 135–145)

## 2015-09-21 LAB — CBC
HCT: 35.5 % — ABNORMAL LOW (ref 36.0–46.0)
Hemoglobin: 12.4 g/dL (ref 12.0–15.0)
MCH: 29.9 pg (ref 26.0–34.0)
MCHC: 34.9 g/dL (ref 30.0–36.0)
MCV: 85.5 fL (ref 78.0–100.0)
Platelets: 400 10*3/uL (ref 150–400)
RBC: 4.15 MIL/uL (ref 3.87–5.11)
RDW: 11.8 % (ref 11.5–15.5)
WBC: 8.7 10*3/uL (ref 4.0–10.5)

## 2015-09-21 MED ORDER — POTASSIUM CHLORIDE IN NACL 20-0.9 MEQ/L-% IV SOLN
INTRAVENOUS | Status: DC
  Administered 2015-09-21: 23:00:00 via INTRAVENOUS
  Administered 2015-09-21: 1000 mL via INTRAVENOUS

## 2015-09-21 MED ORDER — SODIUM CHLORIDE 1 G PO TABS
2.0000 g | ORAL_TABLET | Freq: Two times a day (BID) | ORAL | Status: DC
  Administered 2015-09-21 – 2015-09-24 (×6): 2 g via ORAL
  Filled 2015-09-21 (×8): qty 2

## 2015-09-21 MED ORDER — LOPERAMIDE HCL 2 MG PO CAPS
2.0000 mg | ORAL_CAPSULE | ORAL | Status: DC | PRN
  Administered 2015-09-21 – 2015-09-23 (×3): 2 mg via ORAL
  Filled 2015-09-21 (×4): qty 1

## 2015-09-21 NOTE — Progress Notes (Signed)
PROGRESS NOTE  Cynthia Bradshaw  OHY:[id number] DOB: 11/28/1945 DOA: 09/16/2015 PCP: No PCP Per Patient  Brief Narrative:   Cynthia Bradshaw is a 70 y.o. 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  Over the past few months she has lost 9-lbs because she is afraid to eat.  When she eats, she develops abdominal pain and cramping.  She developed mild confusion and went to see her PCP who labs showed low Na so she was referred to the ER.  Suspect combination of dehydration and tea and toast diet.  Sodium gradually trending up with IVF and free water restriction.  Her IBS symptoms may improve once her sodium normalizes, however, if she is unable to tolerate adequate PO/hydration, she may need GI consultation or further work up for IBS symptoms.    Assessment & Plan:   Principal Problem:   Hyponatremia Active Problems:   Volume depletion   IBS (irritable bowel syndrome)   Protein-calorie malnutrition, severe  Hyponatremia, suspected SIADH -  TSH:  wnl -  Cortisol level 22.6 -  CXR:  No mass -  Patient continued on zofran and add phenergan - On further questioning, patient had reported frequently drinking large amounts of free water on a daily basis prior to admission (around 3-5 L per day) - Patient does have mildly elevated urine sodium as well as elevated urine osmol suggesting SIADH. Patient had been clinically euvolemic on my exam.  - Continue fluid restricted diet.  - Sodium peaked to around 122, however is not improving from there - I have formally consulted Nephrology for further assistance. Discussed case with Nephrology with recommendations to increase salt tabs to 2gm tid with meals, continue diuretic, and changing IVF to NS with K - Will repeat bmet in AM   IBS, ongoing abdominal pains -  Continue prn bentyl - Imodium for diarrhea  Hypertension, BP remains stable on beta blocker - Stable at present  Severe  protein calorie malnutrition -  Continue diet as tolerated -  supplements  DVT prophylaxis:  lovenox Code Status:  full Family Communication:  Patient in room, family at bedside Disposition Plan:  Possible DC home in 24-48 hours   Consultants:   none  Procedures:  none  Antimicrobials:   none    Subjective: Questioning about going home  Objective: Vitals:   09/20/15 1531 09/20/15 2102 09/21/15 0501 09/21/15 1300  BP: (!) 152/63 (!) 151/74 (!) 148/75 126/68  Pulse: 62 (!) 51 86 78  Resp: 18 20 20 20   Temp: 98.3 F (36.8 C) 98.4 F (36.9 C) 98.4 F (36.9 C) 98.2 F (36.8 C)  TempSrc: Oral Oral Oral Oral  SpO2: 96% 98% 98% 97%  Weight:   48.1 kg (106 lb 1.6 oz)   Height:        Intake/Output Summary (Last 24 hours) at 09/21/15 1429 Last data filed at 09/21/15 1200  Gross per 24 hour  Intake          3091.67 ml  Output             3650 ml  Net          -558.33 ml   Filed Weights   09/18/15 0625 09/20/15 0613 09/21/15 0501  Weight: 50.2 kg (110 lb 10.7 oz) 48.7 kg (107 lb 6.4 oz) 48.1 kg (106 lb 1.6 oz)    Examination:  General exam:  Adult female.  No acute distress, Laying in bed,  conversant, pleasant, oriented HEENT:  NCAT, mucous membranes are moist Respiratory system: Clear to auscultation bilaterally Cardiovascular system: Regular rate and rhythm, normal S1/S2. Warm extremities Gastrointestinal system:  Normal bowel sounds, soft, nondistended, mildly TTP diffusely without rebound or guarding. MSK:  Normal tone and bulk, no lower extremity edema Neuro:  Grossly intact, no tremors Skin: Normal skin turgor, no rashes    Data Reviewed: I have personally reviewed following labs and imaging studies  CBC:  Recent Labs Lab 09/16/15 2355 09/18/15 0500 09/19/15 0553 09/20/15 0613 09/21/15 0609  WBC 11.9* 9.0 9.7 10.2 8.7  HGB 12.7 12.8 12.5 11.8* 12.4  HCT 34.1* 35.4* 34.6* 32.8* 35.5*  MCV 82.4 85.1 84.4 85.0 85.5  PLT 427* 316 445* 415* 400     Basic Metabolic Panel:  Recent Labs Lab 09/17/15 2322 09/18/15 0337  09/19/15 0553  09/19/15 1909 09/20/15 0613 09/20/15 1256 09/20/15 1620 09/21/15 0609  NA 114* 116*  < > 118*  < > 119* 122* 121* 121* 120*  K 3.7 3.8  --  3.6  --   --  3.1*  --   --  3.3*  CL 84* 87*  --  83*  --   --  90*  --   --  86*  CO2 19* 18*  --  25  --   --  22  --   --  23  GLUCOSE 101* 83  --  89  --   --  75  --   --  81  BUN 10 10  --  9  --   --  9  --   --  7  CREATININE 0.53 0.42*  --  0.52  --   --  0.45  --   --  0.54  CALCIUM 7.7* 7.8*  --  8.1*  --   --  7.8*  --   --  7.7*  < > = values in this interval not displayed. GFR: Estimated Creatinine Clearance: 50.4 mL/min (by C-G formula based on SCr of 0.8 mg/dL). Liver Function Tests:  Recent Labs Lab 09/16/15 2355  AST 25  ALT 20  ALKPHOS 65  BILITOT 0.7  PROT 6.5  ALBUMIN 3.5   No results for input(s): LIPASE, AMYLASE in the last 168 hours. No results for input(s): AMMONIA in the last 168 hours. Coagulation Profile: No results for input(s): INR, PROTIME in the last 168 hours. Cardiac Enzymes: No results for input(s): CKTOTAL, CKMB, CKMBINDEX, TROPONINI in the last 168 hours. BNP (last 3 results) No results for input(s): PROBNP in the last 8760 hours. HbA1C: No results for input(s): HGBA1C in the last 72 hours. CBG: No results for input(s): GLUCAP in the last 168 hours. Lipid Profile: No results for input(s): CHOL, HDL, LDLCALC, TRIG, CHOLHDL, LDLDIRECT in the last 72 hours. Thyroid Function Tests: No results for input(s): TSH, T4TOTAL, FREET4, T3FREE, THYROIDAB in the last 72 hours. Anemia Panel: No results for input(s): VITAMINB12, FOLATE, FERRITIN, TIBC, IRON, RETICCTPCT in the last 72 hours. Urine analysis:    Component Value Date/Time   COLORURINE YELLOW 09/16/2015 2300   APPEARANCEUR CLEAR 09/16/2015 2300   LABSPEC 1.010 09/16/2015 2300   PHURINE 6.5 09/16/2015 2300   GLUCOSEU NEGATIVE 09/16/2015 2300   HGBUR  TRACE (A) 09/16/2015 2300   BILIRUBINUR NEGATIVE 09/16/2015 2300   KETONESUR NEGATIVE 09/16/2015 2300   PROTEINUR TRACE (A) 09/16/2015 2300   NITRITE NEGATIVE 09/16/2015 2300   LEUKOCYTESUR TRACE (A) 09/16/2015 2300   Sepsis Labs: @LABRCNTIP (procalcitonin:4,lacticidven:4)  )  No results found for this or any previous visit (from the past 240 hour(s)).    Radiology Studies: No results found.   Scheduled Meds: . acidophilus  1 capsule Oral Daily  . atorvastatin  20 mg Oral Daily  . busPIRone  5 mg Oral BID  . cholecalciferol  1,000 Units Oral Daily  . enoxaparin (LOVENOX) injection  40 mg Subcutaneous Q24H  . feeding supplement  1 Container Oral TID BM  . furosemide  20 mg Oral BID  . metoprolol succinate  25 mg Oral Daily  . multivitamin with minerals  1 tablet Oral Daily  . pantoprazole  40 mg Oral Daily  . sodium chloride flush  3 mL Intravenous Q12H  . sodium chloride  2 g Oral BID WC   Continuous Infusions: . 0.9 % NaCl with KCl 20 mEq / L 1,000 mL (09/21/15 0845)     LOS: 5 days    Carley Glendenning, Scheryl MartenSTEPHEN K, MD [facility] Pager [phone]  If 7PM-7AM, please contact night-coverage [email or web address] Password [email or web address] 09/21/2015, 2:29 PM

## 2015-09-21 NOTE — Consult Note (Signed)
Reason for Consult: Hyponatremia Referring Physician: Dr Chipper Herb is an 70 y.o. female.  HPI:  she's a patient was irritable bowel syndrome, hypertension, presently was brought because of some confusion, poor appetite and weight loss. When she was evaluated patient was found to have severe hyponatremia. Possibility of hypovolemic hyponatremia was entertained and patient was put on normal saline with improvement of her sodium from as low as 105 to present level of 121. However the last 24-36 hours her sodium didn't increase in spite of change in her treatment. Patient denies any previous history of hyponatremia. Patient says that no blood work was done hence she doesn't remember having low sodium. Presently she denies any nausea or vomiting. Overall she states she is feeling much better. Past Medical History:  Diagnosis Date  . High cholesterol   . Hypertension   . IBS (irritable bowel syndrome)     Past Surgical History:  Procedure Laterality Date  . ABDOMINAL SURGERY    . COLON SURGERY      History reviewed. No pertinent family history.  Social History:  reports that she has never smoked. She has never used smokeless tobacco. She reports that she does not drink alcohol or use drugs.  Allergies: No Known Allergies  Medications: I have reviewed the patient's current medications.  Results for orders placed or performed during the hospital encounter of 09/16/15 (from the past 48 hour(s))  Sodium     Status: Abnormal   Collection Time: 09/19/15  9:49 AM  Result Value Ref Range   Sodium 119 (LL) 135 - 145 mmol/L    Comment: CRITICAL RESULT CALLED TO, READ BACK BY AND VERIFIED WITH: NURSE COVINGTON AT 1043 ON 09/19/2015 BY AGUNDIZ,E.   Sodium     Status: Abnormal   Collection Time: 09/19/15  1:54 PM  Result Value Ref Range   Sodium 117 (LL) 135 - 145 mmol/L    Comment: CRITICAL RESULT CALLED TO, READ BACK BY AND VERIFIED WITH: COVINGTON,L ON 09/19/15 AT 1520 BY  LOY,C   Sodium     Status: Abnormal   Collection Time: 09/19/15  7:09 PM  Result Value Ref Range   Sodium 119 (LL) 135 - 145 mmol/L    Comment: CRITICAL RESULT CALLED TO, READ BACK BY AND VERIFIED WITH: THOMAS,C ON 09/19/15 AT 1949 BY LOY,C   CBC     Status: Abnormal   Collection Time: 09/20/15  6:13 AM  Result Value Ref Range   WBC 10.2 4.0 - 10.5 K/uL   RBC 3.86 (L) 3.87 - 5.11 MIL/uL   Hemoglobin 11.8 (L) 12.0 - 15.0 g/dL   HCT 32.8 (L) 36.0 - 46.0 %   MCV 85.0 78.0 - 100.0 fL   MCH 30.6 26.0 - 34.0 pg   MCHC 36.0 30.0 - 36.0 g/dL   RDW 12.0 11.5 - 15.5 %   Platelets 415 (H) 150 - 400 K/uL  Basic metabolic panel     Status: Abnormal   Collection Time: 09/20/15  6:13 AM  Result Value Ref Range   Sodium 122 (L) 135 - 145 mmol/L   Potassium 3.1 (L) 3.5 - 5.1 mmol/L   Chloride 90 (L) 101 - 111 mmol/L   CO2 22 22 - 32 mmol/L   Glucose, Bld 75 65 - 99 mg/dL   BUN 9 6 - 20 mg/dL   Creatinine, Ser 0.45 0.44 - 1.00 mg/dL   Calcium 7.8 (L) 8.9 - 10.3 mg/dL   GFR calc non Af Amer >60 >  60 mL/min   GFR calc Af Amer >60 >60 mL/min    Comment: (NOTE) The eGFR has been calculated using the CKD EPI equation. This calculation has not been validated in all clinical situations. eGFR's persistently <60 mL/min signify possible Chronic Kidney Disease.    Anion gap 10 5 - 15  Sodium     Status: Abnormal   Collection Time: 09/20/15 12:56 PM  Result Value Ref Range   Sodium 121 (L) 135 - 145 mmol/L  Sodium     Status: Abnormal   Collection Time: 09/20/15  4:20 PM  Result Value Ref Range   Sodium 121 (L) 135 - 145 mmol/L  CBC     Status: Abnormal   Collection Time: 09/21/15  6:09 AM  Result Value Ref Range   WBC 8.7 4.0 - 10.5 K/uL   RBC 4.15 3.87 - 5.11 MIL/uL   Hemoglobin 12.4 12.0 - 15.0 g/dL   HCT 35.5 (L) 36.0 - 46.0 %   MCV 85.5 78.0 - 100.0 fL   MCH 29.9 26.0 - 34.0 pg   MCHC 34.9 30.0 - 36.0 g/dL   RDW 11.8 11.5 - 15.5 %   Platelets 400 150 - 400 K/uL  Basic metabolic  panel     Status: Abnormal   Collection Time: 09/21/15  6:09 AM  Result Value Ref Range   Sodium 120 (L) 135 - 145 mmol/L   Potassium 3.3 (L) 3.5 - 5.1 mmol/L   Chloride 86 (L) 101 - 111 mmol/L   CO2 23 22 - 32 mmol/L   Glucose, Bld 81 65 - 99 mg/dL   BUN 7 6 - 20 mg/dL   Creatinine, Ser 0.54 0.44 - 1.00 mg/dL   Calcium 7.7 (L) 8.9 - 10.3 mg/dL   GFR calc non Af Amer >60 >60 mL/min   GFR calc Af Amer >60 >60 mL/min    Comment: (NOTE) The eGFR has been calculated using the CKD EPI equation. This calculation has not been validated in all clinical situations. eGFR's persistently <60 mL/min signify possible Chronic Kidney Disease.    Anion gap 11 5 - 15    No results found.  Review of Systems  Constitutional: Positive for weight loss.  Respiratory: Negative for shortness of breath.   Cardiovascular: Negative for orthopnea and leg swelling.  Gastrointestinal: Negative for nausea and vomiting.  Neurological: Positive for weakness.   Blood pressure (!) 148/75, pulse 86, temperature 98.4 F (36.9 C), temperature source Oral, resp. rate 20, height 5' 4" (1.626 m), weight 48.1 kg (106 lb 1.6 oz), SpO2 98 %. Physical Exam  Constitutional: No distress.  Neck: No JVD present.  Cardiovascular: Normal rate and regular rhythm.   No murmur heard. Respiratory: No respiratory distress.  GI: She exhibits no distension. There is no tenderness.  Musculoskeletal: She exhibits no edema.   Assessment: Problem #1 hyponatremia: Initially her urine sodium was 37 and urine osmolality was 567. Patient was treated with normal saline with significant improvement of her sodium. The last 24-48 hours her sodium has remained between 121-123. Patient presently asymptomatic. Etiology could be hypothalamic hyponatremia versus SIADH. Problem #2 irritable bowel syndrome: Patient states that she doesn't have any diarrhea her main problem is constipation. Problem #3 hypertension: Blood pressure is reasonably  controlled Problem #4 hypokalemia: Most likely from diuretics. Problem #5 history of weight loss Problem #6 anemia Plan: We'll continue with free water restriction 2] we'll change IV fluid to normal saline with 20 mEq of KCl at 75 mL per  hour 3] we'll increase her sodium tablet to 2 g by mouth twice a day 4] we'll check her renal panel in the morning.  Chrishawn Kring S 09/21/2015, 8:33 AM

## 2015-09-22 LAB — CBC
HCT: 33.8 % — ABNORMAL LOW (ref 36.0–46.0)
Hemoglobin: 12.1 g/dL (ref 12.0–15.0)
MCH: 30.4 pg (ref 26.0–34.0)
MCHC: 35.8 g/dL (ref 30.0–36.0)
MCV: 84.9 fL (ref 78.0–100.0)
Platelets: 296 10*3/uL (ref 150–400)
RBC: 3.98 MIL/uL (ref 3.87–5.11)
RDW: 11.8 % (ref 11.5–15.5)
WBC: 12.1 10*3/uL — ABNORMAL HIGH (ref 4.0–10.5)

## 2015-09-22 LAB — RENAL FUNCTION PANEL
Albumin: 3.3 g/dL — ABNORMAL LOW (ref 3.5–5.0)
Anion gap: 12 (ref 5–15)
BUN: 8 mg/dL (ref 6–20)
CO2: 22 mmol/L (ref 22–32)
Calcium: 7.4 mg/dL — ABNORMAL LOW (ref 8.9–10.3)
Chloride: 85 mmol/L — ABNORMAL LOW (ref 101–111)
Creatinine, Ser: 0.55 mg/dL (ref 0.44–1.00)
GFR calc Af Amer: >60 mL/min (ref >60)
GFR calc non Af Amer: >60 mL/min (ref >60)
Glucose, Bld: 103 mg/dL — ABNORMAL HIGH (ref 65–99)
Phosphorus: 2 mg/dL — ABNORMAL LOW (ref 2.5–4.6)
Potassium: 3.3 mmol/L — ABNORMAL LOW (ref 3.5–5.1)
Sodium: 119 mmol/L — CL (ref 135–145)

## 2015-09-22 LAB — URIC ACID: Uric Acid, Serum: 3.3 mg/dL (ref 2.3–6.6)

## 2015-09-22 MED ORDER — K PHOS MONO-SOD PHOS DI & MONO 155-852-130 MG PO TABS
250.0000 mg | ORAL_TABLET | Freq: Two times a day (BID) | ORAL | Status: DC
  Administered 2015-09-22 – 2015-09-24 (×4): 250 mg via ORAL
  Filled 2015-09-22 (×7): qty 1

## 2015-09-22 NOTE — Progress Notes (Signed)
Cynthia Bradshaw  MRN: [id number]  DOB/AGE: 1945/10/15 70 y.o.  Primary Care Physician:No PCP Per Patient  Admit date: 09/16/2015  Chief Complaint:  Chief Complaint  Patient presents with  . Abnormal Lab    S-Pt presented on  09/16/2015 with  Chief Complaint  Patient presents with  . Abnormal Lab  .    Pt today feels better.Pt says she has been having IBS issues for past 1-2 decade with occasional flare ups. Pt says I have not ben able to shake this one up from May. Pt says " I drink plenty of water but not able to eat much"   Meds . acidophilus  1 capsule Oral Daily  . atorvastatin  20 mg Oral Daily  . busPIRone  5 mg Oral BID  . cholecalciferol  1,000 Units Oral Daily  . enoxaparin (LOVENOX) injection  40 mg Subcutaneous Q24H  . feeding supplement  1 Container Oral TID BM  . furosemide  20 mg Oral BID  . metoprolol succinate  25 mg Oral Daily  . multivitamin with minerals  1 tablet Oral Daily  . pantoprazole  40 mg Oral Daily  . phosphorus  250 mg Oral BID  . sodium chloride flush  3 mL Intravenous Q12H  . sodium chloride  2 g Oral BID WC     Physical Exam: Vital signs in last 24 hours: Temp:  [98.2 F (36.8 C)-99.4 F (37.4 C)] 98.7 F (37.1 C) (09/03 0535) Pulse Rate:  [78-94] 88 (09/03 0535) Resp:  [20] 20 (09/03 0535) BP: (126-149)/(64-80) 135/80 (09/03 0535) SpO2:  [97 %-99 %] 97 % (09/03 0535) Weight:  [105 lb 8 oz (47.9 kg)] 105 lb 8 oz (47.9 kg) (09/03 0535) Weight change: -9.6 oz (-0.272 kg) Last BM Date: 09/21/15  Intake/Output from previous day: 09/02 0701 - 09/03 0700 In: 3028.8 [P.O.:960; I.V.:2068.8] Out: 1950 [Urine:1950] Total I/O In: -  Out: 200 [Urine:200]   Physical Exam: General- pt is awake,alert, oriented to time place and person Resp- No acute REsp distress, CTA B/L NO Rhonchi CVS- S1S2 regular in rate and rhythm GIT- BS+, soft, NT, ND EXT- NO LE Edema, Cyanosis   Lab Results: CBC  Recent Labs  09/21/15 0609  09/22/15 0535  WBC 8.7 12.1*  HGB 12.4 12.1  HCT 35.5* 33.8*  PLT 400 296    BMET  Recent Labs  09/21/15 0609 09/22/15 0535  NA 120* 119*  K 3.3* 3.3*  CL 86* 85*  CO2 23 22  GLUCOSE 81 103*  BUN 7 8  CREATININE 0.54 0.55  CALCIUM 7.7* 7.4*    MICRO No results found for this or any previous visit (from the past 240 hour(s)).    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 Nausea         TSH normal         Cortisol normal         CXR normal          Pt to have c-scope , Pap smear and mammogram soon as outpt  Hypokalemia exacerbating Hyponatremia            Will replete.  2)HTN  Medication- On Beta blockers    3)Anemia HGb at goal.   4)Vitamin D deficiency On replacement    5)Hypocalcemia Sec to Vitamin D deficincy Primary MD following  6)Hypophosphatemia Will start K phos  7)Acid base Co2 at goal     Plan:  Will start K phos Will rechcek Urine osm Will recheck Urine Na Will d/c IVF Will continue restriction  I had extensive discussion with pt , her husband. Pt husband wanted to go home today. His point was that pt has had low sodium probably from May and that he does not want to delay seeing GI doc as most of her issues arise from IBS. I later educated pt about need to stabilize Sodium and how we plan to do this by water restriction Sodium tablets and lasix.Pt and her husband were is agreement with the caveat that they are walking out of hospital on Tuesday" No matter what " . I did try my best to educate them that Sodium trend is bases to many known and unforseen factors and it is in pt best interest to stay. Will continue to follow  closely.     Maclain Cohron S 09/22/2015, 12:37 PM

## 2015-09-22 NOTE — Progress Notes (Signed)
PROGRESS NOTE  Francene Boyerslizabeth A Breuer  ZOX:[id number] DOB: April 19, 1945 DOA: 09/16/2015 PCP: No PCP Per Patient  Brief Narrative:   Francene Boyerslizabeth A Manton is a 70 y.o. 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  Over the past few months she has lost 9-lbs because she is afraid to eat.  When she eats, she develops abdominal pain and cramping.  She developed mild confusion and went to see her PCP who labs showed low Na so she was referred to the ER.  Suspect combination of dehydration and tea and toast diet.  Sodium gradually trending up with IVF and free water restriction.  Her IBS symptoms may improve once her sodium normalizes, however, if she is unable to tolerate adequate PO/hydration, she may need GI consultation or further work up for IBS symptoms.    Assessment & Plan:   Principal Problem:   Hyponatremia Active Problems:   Volume depletion   IBS (irritable bowel syndrome)   Protein-calorie malnutrition, severe  Hyponatremia, suspected SIADH -  TSH:  wnl -  Cortisol level 22.6 -  CXR:  No mass -  Patient continued on zofran and add phenergan - On further questioning, patient had reported frequently drinking large amounts of free water on a daily basis prior to admission (around 3-5 L per day) - Continue fluid restricted diet.  - Sodium peaked to around 122, however is not improving from there and is slightly lower today - Patient seen with Nephrology today. Appreciate input. Concerns for SIADH at this time. Recommendations for KPhos, d/c IVF - Will repeat bmet in AM. Check urine osm and urine na per Nephrology recs   IBS, ongoing abdominal pains -  Continue prn bentyl - Imodium for diarrhea - Patient scheduled to follow up with Dr. Ewing SchleinMagod on 9/6  Hypertension, BP remains stable on beta blocker - Stable at present  Severe protein calorie malnutrition -  Continue diet as tolerated -  supplements  DVT prophylaxis:   lovenox Code Status:  full Family Communication:  Patient in room, family at bedside Disposition Plan:  Possible DC home in 48 hours   Consultants:   Nephrology  Procedures:  none  Antimicrobials:   none    Subjective: Patient is eager to go home  Objective: Vitals:   09/21/15 0501 09/21/15 1300 09/21/15 2135 09/22/15 0535  BP: (!) 148/75 126/68 (!) 149/64 135/80  Pulse: 86 78 94 88  Resp: 20 20 20 20   Temp: 98.4 F (36.9 C) 98.2 F (36.8 C) 99.4 F (37.4 C) 98.7 F (37.1 C)  TempSrc: Oral Oral Oral Oral  SpO2: 98% 97% 99% 97%  Weight: 48.1 kg (106 lb 1.6 oz)   47.9 kg (105 lb 8 oz)  Height:        Intake/Output Summary (Last 24 hours) at 09/22/15 1350 Last data filed at 09/22/15 0914  Gross per 24 hour  Intake          1308.75 ml  Output             1350 ml  Net           -41.25 ml   Filed Weights   09/20/15 0613 09/21/15 0501 09/22/15 0535  Weight: 48.7 kg (107 lb 6.4 oz) 48.1 kg (106 lb 1.6 oz) 47.9 kg (105 lb 8 oz)    Examination:  General exam:  Adult female.  No acute distress, laying in bed, conversant HEENT:  NCAT, mucous membranes are moist  Respiratory system: Clear to auscultation bilaterally Cardiovascular system: Regular rate and rhythm, normal S1/S2. Warm extremities Gastrointestinal system:  hyperactive bowel sounds, soft, nondistended, mildly TTP diffusely without rebound or guarding. MSK:  Normal tone and bulk, no lower extremity edema Neuro:  Grossly intact, no tremors Skin: Normal skin turgor, no rashes    Data Reviewed: I have personally reviewed following labs and imaging studies  CBC:  Recent Labs Lab 09/18/15 0500 09/19/15 0553 09/20/15 0613 09/21/15 0609 09/22/15 0535  WBC 9.0 9.7 10.2 8.7 12.1*  HGB 12.8 12.5 11.8* 12.4 12.1  HCT 35.4* 34.6* 32.8* 35.5* 33.8*  MCV 85.1 84.4 85.0 85.5 84.9  PLT 316 445* 415* 400 296   Basic Metabolic Panel:  Recent Labs Lab 09/18/15 0337  09/19/15 0553  09/20/15 0613  09/20/15 1256 09/20/15 1620 09/21/15 0609 09/22/15 0535  NA 116*  < > 118*  < > 122* 121* 121* 120* 119*  K 3.8  --  3.6  --  3.1*  --   --  3.3* 3.3*  CL 87*  --  83*  --  90*  --   --  86* 85*  CO2 18*  --  25  --  22  --   --  23 22  GLUCOSE 83  --  89  --  75  --   --  81 103*  BUN 10  --  9  --  9  --   --  7 8  CREATININE 0.42*  --  0.52  --  0.45  --   --  0.54 0.55  CALCIUM 7.8*  --  8.1*  --  7.8*  --   --  7.7* 7.4*  PHOS  --   --   --   --   --   --   --   --  2.0*  < > = values in this interval not displayed. GFR: Estimated Creatinine Clearance: 50.2 mL/min (by C-G formula based on SCr of 0.8 mg/dL). Liver Function Tests:  Recent Labs Lab 09/16/15 2355 09/22/15 0535  AST 25  --   ALT 20  --   ALKPHOS 65  --   BILITOT 0.7  --   PROT 6.5  --   ALBUMIN 3.5 3.3*   No results for input(s): LIPASE, AMYLASE in the last 168 hours. No results for input(s): AMMONIA in the last 168 hours. Coagulation Profile: No results for input(s): INR, PROTIME in the last 168 hours. Cardiac Enzymes: No results for input(s): CKTOTAL, CKMB, CKMBINDEX, TROPONINI in the last 168 hours. BNP (last 3 results) No results for input(s): PROBNP in the last 8760 hours. HbA1C: No results for input(s): HGBA1C in the last 72 hours. CBG: No results for input(s): GLUCAP in the last 168 hours. Lipid Profile: No results for input(s): CHOL, HDL, LDLCALC, TRIG, CHOLHDL, LDLDIRECT in the last 72 hours. Thyroid Function Tests: No results for input(s): TSH, T4TOTAL, FREET4, T3FREE, THYROIDAB in the last 72 hours. Anemia Panel: No results for input(s): VITAMINB12, FOLATE, FERRITIN, TIBC, IRON, RETICCTPCT in the last 72 hours. Urine analysis:    Component Value Date/Time   COLORURINE YELLOW 09/16/2015 2300   APPEARANCEUR CLEAR 09/16/2015 2300   LABSPEC 1.010 09/16/2015 2300   PHURINE 6.5 09/16/2015 2300   GLUCOSEU NEGATIVE 09/16/2015 2300   HGBUR TRACE (A) 09/16/2015 2300   BILIRUBINUR NEGATIVE  09/16/2015 2300   KETONESUR NEGATIVE 09/16/2015 2300   PROTEINUR TRACE (A) 09/16/2015 2300   NITRITE NEGATIVE 09/16/2015 2300  LEUKOCYTESUR TRACE (A) 09/16/2015 2300   Sepsis Labs: @LABRCNTIP (procalcitonin:4,lacticidven:4)  )No results found for this or any previous visit (from the past 240 hour(s)).    Radiology Studies: No results found.   Scheduled Meds: . acidophilus  1 capsule Oral Daily  . atorvastatin  20 mg Oral Daily  . busPIRone  5 mg Oral BID  . cholecalciferol  1,000 Units Oral Daily  . enoxaparin (LOVENOX) injection  40 mg Subcutaneous Q24H  . feeding supplement  1 Container Oral TID BM  . furosemide  20 mg Oral BID  . metoprolol succinate  25 mg Oral Daily  . multivitamin with minerals  1 tablet Oral Daily  . pantoprazole  40 mg Oral Daily  . phosphorus  250 mg Oral BID  . sodium chloride flush  3 mL Intravenous Q12H  . sodium chloride  2 g Oral BID WC   Continuous Infusions:     LOS: 6 days    Crockett Rallo, Scheryl Marten, MD [facility] Pager [phone]  If 7PM-7AM, please contact night-coverage [email or web address] Password [email or web address] 09/22/2015, 1:50 PM

## 2015-09-23 LAB — CBC
HCT: 33.5 % — ABNORMAL LOW (ref 36.0–46.0)
Hemoglobin: 11.7 g/dL — ABNORMAL LOW (ref 12.0–15.0)
MCH: 30.2 pg (ref 26.0–34.0)
MCHC: 34.9 g/dL (ref 30.0–36.0)
MCV: 86.6 fL (ref 78.0–100.0)
Platelets: 382 10*3/uL (ref 150–400)
RBC: 3.87 MIL/uL (ref 3.87–5.11)
RDW: 11.7 % (ref 11.5–15.5)
WBC: 8.2 10*3/uL (ref 4.0–10.5)

## 2015-09-23 LAB — BASIC METABOLIC PANEL
Anion gap: 10 (ref 5–15)
BUN: 8 mg/dL (ref 6–20)
CO2: 27 mmol/L (ref 22–32)
Calcium: 7.6 mg/dL — ABNORMAL LOW (ref 8.9–10.3)
Chloride: 87 mmol/L — ABNORMAL LOW (ref 101–111)
Creatinine, Ser: 0.62 mg/dL (ref 0.44–1.00)
GFR calc Af Amer: >60 mL/min (ref >60)
GFR calc non Af Amer: >60 mL/min (ref >60)
Glucose, Bld: 90 mg/dL (ref 65–99)
Potassium: 3 mmol/L — ABNORMAL LOW (ref 3.5–5.1)
Sodium: 124 mmol/L — ABNORMAL LOW (ref 135–145)

## 2015-09-23 LAB — OSMOLALITY, URINE: Osmolality, Ur: 359 mOsm/kg (ref 300–900)

## 2015-09-23 LAB — SODIUM, URINE, RANDOM: Sodium, Ur: 79 mmol/L

## 2015-09-23 MED ORDER — POTASSIUM CHLORIDE CRYS ER 20 MEQ PO TBCR
60.0000 meq | EXTENDED_RELEASE_TABLET | Freq: Once | ORAL | Status: AC
  Administered 2015-09-23: 60 meq via ORAL
  Filled 2015-09-23: qty 3

## 2015-09-23 NOTE — Progress Notes (Signed)
PROGRESS NOTE  Cynthia Bradshaw  DPT:[id number] DOB: 1945/11/02 DOA: 09/16/2015 PCP: No PCP Per Patient  Brief Narrative:   Cynthia Bradshaw is a 70 y.o. 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  Over the past few months she has lost 9-lbs because she is afraid to eat.  When she eats, she develops abdominal pain and cramping.  She developed mild confusion and went to see her PCP who labs showed low Na so she was referred to the ER.  Suspect combination of dehydration and tea and toast diet.  Sodium gradually trending up with IVF and free water restriction.  Her IBS symptoms may improve once her sodium normalizes, however, if she is unable to tolerate adequate PO/hydration, she may need GI consultation or further work up for IBS symptoms.    Assessment & Plan:   Principal Problem:   Hyponatremia Active Problems:   Volume depletion   IBS (irritable bowel syndrome)   Protein-calorie malnutrition, severe  Hyponatremia, suspected SIADH -  TSH:  wnl -  Cortisol level 22.6 -  CXR:  No mass -  Patient continued on zofran and add phenergan - On further questioning, patient had reported frequently drinking large amounts of free water on a daily basis prior to admission (around 3-5 L per day) - Continue fluid restricted diet.  - Appreciate input by nephrology. Concerns for SIADH at this time. Recommendations for KPhos, continue fluid restriction - Serum sodium improved to 124 today.  IBS, ongoing abdominal pains -  Continue prn bentyl - Patient complains of "stomach rumbling" today. Continue bentyl as tolerated - Patient scheduled to follow up with Dr. Ewing Bradshaw on 9/6  Hypertension, BP remains stable on beta blocker - Stable at present  Severe protein calorie malnutrition -  Continue diet as tolerated -  supplements  DVT prophylaxis:  lovenox Code Status:  full Family Communication:  Patient in room, family at  bedside Disposition Plan:  Possible DC home in 48 hours   Consultants:   Nephrology  Procedures:  none  Antimicrobials:   none    Subjective: No complaints today  Objective: Vitals:   09/22/15 1400 09/22/15 2124 09/23/15 0541 09/23/15 1445  BP: (!) 161/84 123/63 133/71 138/73  Pulse: 91 72 86 75  Resp: 18 20 20 20   Temp: 97.9 F (36.6 C) 98.7 F (37.1 C) 98.2 F (36.8 C) 97.9 F (36.6 C)  TempSrc: Oral Oral Oral Oral  SpO2: 97% 96% 98% 98%  Weight:   46.3 kg (102 lb)   Height:        Intake/Output Summary (Last 24 hours) at 09/23/15 1515 Last data filed at 09/23/15 1445  Gross per 24 hour  Intake              600 ml  Output             1300 ml  Net             -700 ml   Filed Weights   09/21/15 0501 09/22/15 0535 09/23/15 0541  Weight: 48.1 kg (106 lb 1.6 oz) 47.9 kg (105 lb 8 oz) 46.3 kg (102 lb)    Examination:  General exam:  Adult female.  No acute distress, laying in bed, conversant HEENT:  NCAT, mucous membranes are moist Respiratory system: Clear to auscultation bilaterally Cardiovascular system: Regular rate and rhythm, normal S1/S2. Warm extremities Gastrointestinal system:  hyperactive bowel sounds, soft, nondistended, mildly TTP diffusely without  rebound or guarding. MSK:  Normal tone and bulk, no lower extremity edema Neuro:  Grossly intact, no tremors Skin: Normal skin turgor, no rashes    Data Reviewed: I have personally reviewed following labs and imaging studies  CBC:  Recent Labs Lab 09/19/15 0553 09/20/15 0613 09/21/15 0609 09/22/15 0535 09/23/15 0543  WBC 9.7 10.2 8.7 12.1* 8.2  HGB 12.5 11.8* 12.4 12.1 11.7*  HCT 34.6* 32.8* 35.5* 33.8* 33.5*  MCV 84.4 85.0 85.5 84.9 86.6  PLT 445* 415* 400 296 382   Basic Metabolic Panel:  Recent Labs Lab 09/19/15 0553  09/20/15 0613 09/20/15 1256 09/20/15 1620 09/21/15 0609 09/22/15 0535 09/23/15 0543  NA 118*  < > 122* 121* 121* 120* 119* 124*  K 3.6  --  3.1*  --   --   3.3* 3.3* 3.0*  CL 83*  --  90*  --   --  86* 85* 87*  CO2 25  --  22  --   --  23 22 27   GLUCOSE 89  --  75  --   --  81 103* 90  BUN 9  --  9  --   --  7 8 8   CREATININE 0.52  --  0.45  --   --  0.54 0.55 0.62  CALCIUM 8.1*  --  7.8*  --   --  7.7* 7.4* 7.6*  PHOS  --   --   --   --   --   --  2.0*  --   < > = values in this interval not displayed. GFR: Estimated Creatinine Clearance: 48.5 mL/min (by C-G formula based on SCr of 0.8 mg/dL). Liver Function Tests:  Recent Labs Lab 09/16/15 2355 09/22/15 0535  AST 25  --   ALT 20  --   ALKPHOS 65  --   BILITOT 0.7  --   PROT 6.5  --   ALBUMIN 3.5 3.3*   No results for input(s): LIPASE, AMYLASE in the last 168 hours. No results for input(s): AMMONIA in the last 168 hours. Coagulation Profile: No results for input(s): INR, PROTIME in the last 168 hours. Cardiac Enzymes: No results for input(s): CKTOTAL, CKMB, CKMBINDEX, TROPONINI in the last 168 hours. BNP (last 3 results) No results for input(s): PROBNP in the last 8760 hours. HbA1C: No results for input(s): HGBA1C in the last 72 hours. CBG: No results for input(s): GLUCAP in the last 168 hours. Lipid Profile: No results for input(s): CHOL, HDL, LDLCALC, TRIG, CHOLHDL, LDLDIRECT in the last 72 hours. Thyroid Function Tests: No results for input(s): TSH, T4TOTAL, FREET4, T3FREE, THYROIDAB in the last 72 hours. Anemia Panel: No results for input(s): VITAMINB12, FOLATE, FERRITIN, TIBC, IRON, RETICCTPCT in the last 72 hours. Urine analysis:    Component Value Date/Time   COLORURINE YELLOW 09/16/2015 2300   APPEARANCEUR CLEAR 09/16/2015 2300   LABSPEC 1.010 09/16/2015 2300   PHURINE 6.5 09/16/2015 2300   GLUCOSEU NEGATIVE 09/16/2015 2300   HGBUR TRACE (A) 09/16/2015 2300   BILIRUBINUR NEGATIVE 09/16/2015 2300   KETONESUR NEGATIVE 09/16/2015 2300   PROTEINUR TRACE (A) 09/16/2015 2300   NITRITE NEGATIVE 09/16/2015 2300   LEUKOCYTESUR TRACE (A) 09/16/2015 2300   Sepsis  Labs: @LABRCNTIP (procalcitonin:4,lacticidven:4)  )No results found for this or any previous visit (from the past 240 hour(s)).    Radiology Studies: No results found.   Scheduled Meds: . acidophilus  1 capsule Oral Daily  . atorvastatin  20 mg Oral Daily  . busPIRone  5 mg Oral BID  . cholecalciferol  1,000 Units Oral Daily  . enoxaparin (LOVENOX) injection  40 mg Subcutaneous Q24H  . feeding supplement  1 Container Oral TID BM  . furosemide  20 mg Oral BID  . metoprolol succinate  25 mg Oral Daily  . multivitamin with minerals  1 tablet Oral Daily  . pantoprazole  40 mg Oral Daily  . phosphorus  250 mg Oral BID  . sodium chloride flush  3 mL Intravenous Q12H  . sodium chloride  2 g Oral BID WC   Continuous Infusions:     LOS: 7 days    CHIU, Scheryl Marten, MD [facility] Pager [phone]  If 7PM-7AM, please contact night-coverage [email or web address] Password [email or web address] 09/23/2015, 3:15 PM

## 2015-09-23 NOTE — Progress Notes (Signed)
Cynthia Bradshaw  MRN: [id number]  DOB/AGE: April 26, 1945 70 y.o.  Primary Care Physician:No PCP Per Patient  Admit date: 09/16/2015  Chief Complaint:  Chief Complaint  Patient presents with  . Abnormal Lab    S-Pt presented on  09/16/2015 with  Chief Complaint  Patient presents with  . Abnormal Lab  .    Pt today feels better.Pt says she has been having IBS issues for past 1-2 decade with occasional flare ups. Pt says I have not ben able to shake this one up from May. Pt says " I drink plenty of water but not able to eat much"   Meds . acidophilus  1 capsule Oral Daily  . atorvastatin  20 mg Oral Daily  . busPIRone  5 mg Oral BID  . cholecalciferol  1,000 Units Oral Daily  . enoxaparin (LOVENOX) injection  40 mg Subcutaneous Q24H  . feeding supplement  1 Container Oral TID BM  . furosemide  20 mg Oral BID  . metoprolol succinate  25 mg Oral Daily  . multivitamin with minerals  1 tablet Oral Daily  . pantoprazole  40 mg Oral Daily  . phosphorus  250 mg Oral BID  . potassium chloride  60 mEq Oral Once  . sodium chloride flush  3 mL Intravenous Q12H  . sodium chloride  2 g Oral BID WC     Physical Exam: Vital signs in last 24 hours: Temp:  [97.9 F (36.6 C)-98.7 F (37.1 C)] 98.2 F (36.8 C) (09/04 0541) Pulse Rate:  [72-91] 86 (09/04 0541) Resp:  [18-20] 20 (09/04 0541) BP: (123-161)/(63-84) 133/71 (09/04 0541) SpO2:  [96 %-98 %] 98 % (09/04 0541) Weight:  [102 lb (46.3 kg)] 102 lb (46.3 kg) (09/04 0541) Weight change: -3 lb 8 oz (-1.588 kg) Last BM Date: 09/22/15  Intake/Output from previous day: 09/03 0701 - 09/04 0700 In: 1723.8 [P.O.:720; I.V.:1003.8] Out: 1900 [Urine:1900] No intake/output data recorded.   Physical Exam: General- pt is awake,alert, oriented to time place and person Resp- No acute REsp distress, CTA B/L NO Rhonchi CVS- S1S2 regular in rate and rhythm GIT- BS+, soft, NT, ND EXT- NO LE Edema, Cyanosis   Lab Results: CBC  Recent  Labs  09/22/15 0535 09/23/15 0543  WBC 12.1* 8.2  HGB 12.1 11.7*  HCT 33.8* 33.5*  PLT 296 382    BMET  Recent Labs  09/22/15 0535 09/23/15 0543  NA 119* 124*  K 3.3* 3.0*  CL 85* 87*  CO2 22 27  GLUCOSE 103* 90  BUN 8 8  CREATININE 0.55 0.62  CALCIUM 7.4* 7.6*   Sodium 2017 105=>119==>124   MICRO No results found for this or any previous visit (from the past 240 hour(s)).    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 Nausea         TSH normal         Cortisol normal         CXR normal          Pt to have c-scope , Pap smear and mammogram soon  as outpt          Hypokalemia exacerbating Hyponatremia            Will replete.  2)HTN  Medication- On Beta blockers    3)Anemia HGb at goal.   4)Vitamin D deficiency On replacement    5)Hypocalcemia Sec to Vitamin D deficincy Primary MD following  6)Hypophosphatemia on K phos  7)Acid base Co2 at goal     Plan:  Will continue current care. Will follow bmet     Glennis Borger S 09/23/2015, 8:11 AM

## 2015-09-23 NOTE — Progress Notes (Signed)
Nutrition Follow-Up  INTERVENTION:  Decrease Boost Breeze po to BID, each supplement provides 250 kcal and 9 grams of protein   Continue to encourage regular (High calorie/high Protein) diet ad lib to promote weight gain and repletion of nutrient deficits   NUTRITION DIAGNOSIS:   Malnutrition related to altered GI function, acute illness (nausea and vomiting) hyponatremia as evidenced by moderate depletion of body fat, moderate depletions of muscle mass; ongoing with additional  weight loss since admission.    GOAL:   Patient will meet greater than or equal to 90% of their needs, Weight gain; not met   MONITOR:   PO intake, Supplement acceptance, Labs, Weight trends   ASSESSMENT:   Admission:Patient is underweight and has hx of IBS and complains of chronic abdominal pain and cramping and has gradually eliminated many foods from her diet. She was volume depleted, severely malnourished (see initial nutr assessment) on admission and confused.  9/4: Patient is alert and pleasant this morning. Her diet has been liberalized to Regular. Meal intake has been slowly improving. No c/o abdominal pain or vomiting. Her daughter Jenny Reichmann is here and says, "she's been eating enough to feed her 10# terrier".  Patient says she may be discharged tomorrow. Will continue to follow.   Recent Labs Lab 09/21/15 0609 09/22/15 0535 09/23/15 0543  NA 120* 119* 124*  K 3.3* 3.3* 3.0*  CL 86* 85* 87*  CO2 '23 22 27  ' BUN '7 8 8  ' CREATININE 0.54 0.55 0.62  CALCIUM 7.7* 7.4* 7.6*  PHOS  --  2.0*  --   GLUCOSE 81 103* 90    Labs: hyponatremia- continuing to improve  Diet Order:  Diet regular Room service appropriate? Yes; Fluid consistency: Thin; Fluid restriction: 1200 mL Fluid  Skin:  Reviewed, no issues  Last BM:  9/3   Height:   Ht Readings from Last 1 Encounters:  09/20/15 '5\' 4"'  (1.626 m)    Weight:   Wt Readings from Last 1 Encounters:  09/23/15 102 lb (46.3 kg)    Ideal Body Weight:   55 kg  BMI:  Body mass index is 17.51 kg/m.  underweight  Estimated Nutritional Needs:   Kcal:  1500-1750 (to prevent further weight loss)  Protein:  77-75 gr   Fluid:  1.2 liters daily per MD  EDUCATION NEEDS:   Education needs no appropriate at this time  Colman Cater MS,RD,CSG,LDN Office: [phone] Pager: [phone]

## 2015-09-23 NOTE — Care Management Important Message (Signed)
Important Message  Patient Details  Name: Cynthia Bradshaw MRN: [id number] Date of Birth: September 22, 1945   Medicare Important Message Given:  Yes    Malcolm Metro, RN 09/23/2015, 10:12 AM

## 2015-09-24 LAB — CBC
HCT: 34.7 % — ABNORMAL LOW (ref 36.0–46.0)
Hemoglobin: 12.1 g/dL (ref 12.0–15.0)
MCH: 30.3 pg (ref 26.0–34.0)
MCHC: 34.9 g/dL (ref 30.0–36.0)
MCV: 86.8 fL (ref 78.0–100.0)
Platelets: 403 10*3/uL — ABNORMAL HIGH (ref 150–400)
RBC: 4 MIL/uL (ref 3.87–5.11)
RDW: 11.6 % (ref 11.5–15.5)
WBC: 7 10*3/uL (ref 4.0–10.5)

## 2015-09-24 LAB — BASIC METABOLIC PANEL
Anion gap: 12 (ref 5–15)
BUN: 7 mg/dL (ref 6–20)
CO2: 28 mmol/L (ref 22–32)
Calcium: 8.3 mg/dL — ABNORMAL LOW (ref 8.9–10.3)
Chloride: 84 mmol/L — ABNORMAL LOW (ref 101–111)
Creatinine, Ser: 0.72 mg/dL (ref 0.44–1.00)
GFR calc Af Amer: >60 mL/min (ref >60)
GFR calc non Af Amer: >60 mL/min (ref >60)
Glucose, Bld: 106 mg/dL — ABNORMAL HIGH (ref 65–99)
Potassium: 3.2 mmol/L — ABNORMAL LOW (ref 3.5–5.1)
Sodium: 124 mmol/L — ABNORMAL LOW (ref 135–145)

## 2015-09-24 MED ORDER — POTASSIUM CHLORIDE CRYS ER 20 MEQ PO TBCR
60.0000 meq | EXTENDED_RELEASE_TABLET | Freq: Once | ORAL | Status: AC
  Administered 2015-09-24: 60 meq via ORAL
  Filled 2015-09-24: qty 3

## 2015-09-24 NOTE — Discharge Summary (Signed)
Physician Discharge Summary  Cynthia Bradshaw CLE:[id number] DOB: [birth year] DOA: 09/16/2015  PCP: No PCP Per Patient  Admit date: 09/16/2015 Discharge date: 09/24/2015  Admitted From: Home Disposition:  Left against medical advise  Recommendations for Outpatient Follow-up:  1. Strongly recommend follow up with PCP in 1-2 weeks 2. Recommend close monitoring of BMP starting within one week   Brief/Interim Summary: 70 y.o.femalewith history of IBS and prior bowel resection for perforated stercoral ulcer in 2006-07.  Over the past few months she has lost 9-lbs because she is afraid to eat.  When she eats, she develops abdominal pain and cramping.  She developed mild confusion and went to see her PCP who labs showed low Na so she was referred to the ER.    Hyponatremia, suspected SIADH -  TSH:  wnl -  Cortisol level 22.6 -  CXR:  No mass -  Patient continued on zofran and add phenergan - On further questioning, patient had reported frequently drinking large amounts of free water on a daily basis prior to admission (around 3-5 L per day) - Continued fluid restricted diet.  - Appreciate input by nephrology. Concerns for SIADH at this time. Recommendations for KPhos, continue fluid restriction and salt tabs - Serum sodium improved to 124 however plateaued. Discussed case with Nephrology who strongly recommended continued hospitalization to correct sodium, however patient and family are adamant about leaving today against medical advise  IBS, ongoing abdominal pains -  Continue prn bentyl - Patient complains of "stomach rumbling" today. Continue bentyl as tolerated - Patient scheduled to follow up with Dr. Ewing Schlein on 9/6  Hypertension, BP remains stable on beta blocker - Stable at present  Severe protein calorie malnutrition -  Continue diet as tolerated -  supplements  Discharge Diagnoses:  Principal Problem:   Hyponatremia Active Problems:   Volume depletion   IBS  (irritable bowel syndrome)   Protein-calorie malnutrition, severe    Discharge Instructions   Left against medical advise  No Known Allergies  Consultations:  Nephrology  Procedures/Studies: Dg Chest 2 View  Result Date: 09/17/2015 CLINICAL DATA:  70 year old female with nausea and vomiting for 9 days. Hyponatremia. Initial encounter. EXAM: CHEST  2 VIEW COMPARISON:  CT Abdomen and Pelvis 08/21/2004. FINDINGS: Mild to moderate thoracolumbar scoliosis. Osteopenia. No acute osseous abnormality identified. Lung volumes are within normal limits. Normal cardiac size and mediastinal contours. Visualized tracheal air column is within normal limits. No pneumothorax, pulmonary edema, pleural effusion or confluent pulmonary opacity. Negative visible bowel gas pattern. IMPRESSION: No acute cardiopulmonary abnormality. Electronically Signed   By: Odessa Fleming M.D.   On: 09/17/2015 13:27     Subjective: States she will be leaving today  Discharge Exam: Vitals:   09/23/15 1937 09/24/15 0500  BP: 128/71 125/69  Pulse: 77 77  Resp: 18 18  Temp: 98 F (36.7 C) 98.3 F (36.8 C)   Vitals:   09/23/15 1445 09/23/15 1937 09/23/15 2015 09/24/15 0500  BP: 138/73 128/71  125/69  Pulse: 75 77  77  Resp: 20 18  18   Temp: 97.9 F (36.6 C) 98 F (36.7 C)  98.3 F (36.8 C)  TempSrc: Oral Oral  Oral  SpO2: 98% 98% 98% 97%  Weight:    45.4 kg (100 lb)  Height:        General: Pt is alert, awake, not in acute distress Cardiovascular: RRR, S1/S2 +, no rubs, no gallops Respiratory: CTA bilaterally, no wheezing, no rhonchi Abdominal: Soft, NT, ND, bowel sounds +  Extremities: no edema, no cyanosis   The results of significant diagnostics from this hospitalization (including imaging, microbiology, ancillary and laboratory) are listed below for reference.     Microbiology: No results found for this or any previous visit (from the past 240 hour(s)).   Labs: BNP (last 3 results) No results for  input(s): BNP in the last 8760 hours. Basic Metabolic Panel:  Recent Labs Lab 09/20/15 0613  09/20/15 1620 09/21/15 0609 09/22/15 0535 09/23/15 0543 09/24/15 0601  NA 122*  < > 121* 120* 119* 124* 124*  K 3.1*  --   --  3.3* 3.3* 3.0* 3.2*  CL 90*  --   --  86* 85* 87* 84*  CO2 22  --   --  23 22 27 28   GLUCOSE 75  --   --  81 103* 90 106*  BUN 9  --   --  7 8 8 7   CREATININE 0.45  --   --  0.54 0.55 0.62 0.72  CALCIUM 7.8*  --   --  7.7* 7.4* 7.6* 8.3*  PHOS  --   --   --   --  2.0*  --   --   < > = values in this interval not displayed. Liver Function Tests:  Recent Labs Lab 09/22/15 0535  ALBUMIN 3.3*   No results for input(s): LIPASE, AMYLASE in the last 168 hours. No results for input(s): AMMONIA in the last 168 hours. CBC:  Recent Labs Lab 09/20/15 0613 09/21/15 0609 09/22/15 0535 09/23/15 0543 09/24/15 0601  WBC 10.2 8.7 12.1* 8.2 7.0  HGB 11.8* 12.4 12.1 11.7* 12.1  HCT 32.8* 35.5* 33.8* 33.5* 34.7*  MCV 85.0 85.5 84.9 86.6 86.8  PLT 415* 400 296 382 403*   Cardiac Enzymes: No results for input(s): CKTOTAL, CKMB, CKMBINDEX, TROPONINI in the last 168 hours. BNP: Invalid input(s): POCBNP CBG: No results for input(s): GLUCAP in the last 168 hours. D-Dimer No results for input(s): DDIMER in the last 72 hours. Hgb A1c No results for input(s): HGBA1C in the last 72 hours. Lipid Profile No results for input(s): CHOL, HDL, LDLCALC, TRIG, CHOLHDL, LDLDIRECT in the last 72 hours. Thyroid function studies No results for input(s): TSH, T4TOTAL, T3FREE, THYROIDAB in the last 72 hours.  Invalid input(s): FREET3 Anemia work up No results for input(s): VITAMINB12, FOLATE, FERRITIN, TIBC, IRON, RETICCTPCT in the last 72 hours. Urinalysis    Component Value Date/Time   COLORURINE YELLOW 09/16/2015 2300   APPEARANCEUR CLEAR 09/16/2015 2300   LABSPEC 1.010 09/16/2015 2300   PHURINE 6.5 09/16/2015 2300   GLUCOSEU NEGATIVE 09/16/2015 2300   HGBUR TRACE (A)  09/16/2015 2300   BILIRUBINUR NEGATIVE 09/16/2015 2300   KETONESUR NEGATIVE 09/16/2015 2300   PROTEINUR TRACE (A) 09/16/2015 2300   NITRITE NEGATIVE 09/16/2015 2300   LEUKOCYTESUR TRACE (A) 09/16/2015 2300   Sepsis Labs Invalid input(s): PROCALCITONIN,  WBC,  LACTICIDVEN Microbiology No results found for this or any previous visit (from the past 240 hour(s)).   SIGNED:   Jerald KiefHIU, Milena Liggett K, MD  [facility] 09/24/2015, 9:48 AM  If 7PM-7AM, please contact night-coverage [email or web address] Password [email or web address]

## 2015-09-24 NOTE — Progress Notes (Signed)
Francene Boyerslizabeth A Cocke  MRN: [id number]  DOB/AGE: 09-08-1945 70 y.o.  Primary Care Physician:No PCP Per Patient  Admit date: 09/16/2015  Chief Complaint:  Chief Complaint  Patient presents with  . Abnormal Lab    S-Pt presented on  09/16/2015 with  Chief Complaint  Patient presents with  . Abnormal Lab  .    Pt today feels better.Pt says she has been having IBS issues for past 1-2 decade with occasional flare ups. Pt says I have not ben able to shake this one up from May. Pt says " I drink plenty of water but not able to eat much"   Meds . acidophilus  1 capsule Oral Daily  . atorvastatin  20 mg Oral Daily  . busPIRone  5 mg Oral BID  . cholecalciferol  1,000 Units Oral Daily  . enoxaparin (LOVENOX) injection  40 mg Subcutaneous Q24H  . feeding supplement  1 Container Oral TID BM  . furosemide  20 mg Oral BID  . metoprolol succinate  25 mg Oral Daily  . multivitamin with minerals  1 tablet Oral Daily  . pantoprazole  40 mg Oral Daily  . phosphorus  250 mg Oral BID  . sodium chloride flush  3 mL Intravenous Q12H  . sodium chloride  2 g Oral BID WC     Physical Exam: Vital signs in last 24 hours: Temp:  [97.9 F (36.6 C)-98.3 F (36.8 C)] 98.3 F (36.8 C) (09/05 0500) Pulse Rate:  [75-77] 77 (09/05 0500) Resp:  [18-20] 18 (09/05 0500) BP: (125-138)/(69-73) 125/69 (09/05 0500) SpO2:  [97 %-98 %] 97 % (09/05 0500) Weight:  [100 lb (45.4 kg)] 100 lb (45.4 kg) (09/05 0500) Weight change: -2 lb (-0.907 kg) Last BM Date: 09/23/15  Intake/Output from previous day: 09/04 0701 - 09/05 0700 In: 836 [P.O.:836] Out: 1500 [Urine:1500] No intake/output data recorded.   Physical Exam: General- pt is awake,alert, oriented to time place and person Resp- No acute REsp distress, CTA B/L NO Rhonchi CVS- S1S2 regular in rate and rhythm GIT- BS+, soft, NT, ND EXT- NO LE Edema, Cyanosis   Lab Results: CBC  Recent Labs  09/23/15 0543 09/24/15 0601  WBC 8.2 7.0  HGB 11.7*  12.1  HCT 33.5* 34.7*  PLT 382 403*    BMET  Recent Labs  09/23/15 0543 09/24/15 0601  NA 124* 124*  K 3.0* 3.2*  CL 87* 84*  CO2 27 28  GLUCOSE 90 106*  BUN 8 7  CREATININE 0.62 0.72  CALCIUM 7.6* 8.3*   Sodium 2017 105=>119==>124   MICRO No results found for this or any previous visit (from the past 240 hour(s)).    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 Nausea         TSH normal         Cortisol normal         CXR normal          Pt to have c-scope , Pap smear and mammogram soon as outpt          Hypokalemia  exacerbating Hyponatremia            Will replete.  2)HTN  Medication- On Beta blockers    3)Anemia HGb at goal.   4)Vitamin D deficiency On replacement    5)Hypocalcemia Sec to Vitamin D deficincy Primary MD following  6)Hypophosphatemia on K phos  7)Acid base Co2 at goal     Plan:  Will continue current care. Will follow bmet  I educated pt at lenght about different symptoms from, low sodium and need to follow closely. Pt as she said on Sunday that she will go home on Tuesday no matter what sodium is,  Pt is planing to go home. I also educated pt about need to follow up with nephrology closely.Pt wishes to have his pcp follow the sodium.   Kaitlynne Wenz S 09/24/2015, 9:00 AM

## 2015-09-24 NOTE — Progress Notes (Signed)
Pt requested IV removal and to leave AMA.  Dr Rhona Leavens is aware and this RN notified M Bullins, Charge Nurse.  Pt tolerated IV removal.  Pt left hospital with husband and belongings.

## 2015-09-25 DIAGNOSIS — R1084 Generalized abdominal pain: Secondary | ICD-10-CM | POA: Diagnosis not present

## 2015-09-25 DIAGNOSIS — K551 Chronic vascular disorders of intestine: Secondary | ICD-10-CM | POA: Diagnosis not present

## 2015-09-25 DIAGNOSIS — K580 Irritable bowel syndrome with diarrhea: Secondary | ICD-10-CM | POA: Diagnosis not present

## 2015-09-25 DIAGNOSIS — R634 Abnormal weight loss: Secondary | ICD-10-CM | POA: Diagnosis not present

## 2015-09-25 DIAGNOSIS — R112 Nausea with vomiting, unspecified: Secondary | ICD-10-CM | POA: Diagnosis not present

## 2015-09-26 ENCOUNTER — Other Ambulatory Visit: Payer: Self-pay | Admitting: Gastroenterology

## 2015-09-26 DIAGNOSIS — R1084 Generalized abdominal pain: Secondary | ICD-10-CM

## 2015-09-27 ENCOUNTER — Ambulatory Visit
Admission: RE | Admit: 2015-09-27 | Discharge: 2015-09-27 | Disposition: A | Discharge status: Home / Self Care | Payer: Medicare Other | Source: Ambulatory Visit | Attending: Gastroenterology | Admitting: Gastroenterology

## 2015-09-27 DIAGNOSIS — R1084 Generalized abdominal pain: Secondary | ICD-10-CM

## 2015-09-27 DIAGNOSIS — E222 Syndrome of inappropriate secretion of antidiuretic hormone: Secondary | ICD-10-CM | POA: Diagnosis not present

## 2015-09-27 MED ORDER — IOPAMIDOL (ISOVUE-300) INJECTION 61%
100.0000 mL | Freq: Once | INTRAVENOUS | Status: AC | PRN
  Administered 2015-09-27: 100 mL via INTRAVENOUS

## 2015-10-04 ENCOUNTER — Other Ambulatory Visit: Payer: Medicare Other

## 2015-10-07 DIAGNOSIS — R112 Nausea with vomiting, unspecified: Secondary | ICD-10-CM | POA: Diagnosis not present

## 2015-10-07 DIAGNOSIS — R634 Abnormal weight loss: Secondary | ICD-10-CM | POA: Diagnosis not present

## 2015-10-07 DIAGNOSIS — R1084 Generalized abdominal pain: Secondary | ICD-10-CM | POA: Diagnosis not present

## 2015-10-07 DIAGNOSIS — E222 Syndrome of inappropriate secretion of antidiuretic hormone: Secondary | ICD-10-CM | POA: Diagnosis not present

## 2015-10-07 DIAGNOSIS — R933 Abnormal findings on diagnostic imaging of other parts of digestive tract: Secondary | ICD-10-CM | POA: Diagnosis not present

## 2015-10-16 DIAGNOSIS — E7800 Pure hypercholesterolemia, unspecified: Secondary | ICD-10-CM | POA: Diagnosis not present

## 2015-10-16 DIAGNOSIS — K580 Irritable bowel syndrome with diarrhea: Secondary | ICD-10-CM | POA: Diagnosis not present

## 2015-10-16 DIAGNOSIS — J309 Allergic rhinitis, unspecified: Secondary | ICD-10-CM | POA: Diagnosis not present

## 2015-10-16 DIAGNOSIS — E222 Syndrome of inappropriate secretion of antidiuretic hormone: Secondary | ICD-10-CM | POA: Diagnosis not present

## 2015-10-16 DIAGNOSIS — F39 Unspecified mood [affective] disorder: Secondary | ICD-10-CM | POA: Diagnosis not present

## 2015-10-16 DIAGNOSIS — M810 Age-related osteoporosis without current pathological fracture: Secondary | ICD-10-CM | POA: Diagnosis not present

## 2015-10-16 DIAGNOSIS — G43909 Migraine, unspecified, not intractable, without status migrainosus: Secondary | ICD-10-CM | POA: Diagnosis not present

## 2015-10-16 DIAGNOSIS — Z23 Encounter for immunization: Secondary | ICD-10-CM | POA: Diagnosis not present

## 2015-10-16 DIAGNOSIS — Z0000 Encounter for general adult medical examination without abnormal findings: Secondary | ICD-10-CM | POA: Diagnosis not present

## 2015-10-16 DIAGNOSIS — I10 Essential (primary) hypertension: Secondary | ICD-10-CM | POA: Diagnosis not present

## 2015-11-15 DIAGNOSIS — R933 Abnormal findings on diagnostic imaging of other parts of digestive tract: Secondary | ICD-10-CM | POA: Diagnosis not present

## 2015-11-15 DIAGNOSIS — R634 Abnormal weight loss: Secondary | ICD-10-CM | POA: Diagnosis not present

## 2015-11-15 DIAGNOSIS — R112 Nausea with vomiting, unspecified: Secondary | ICD-10-CM | POA: Diagnosis not present

## 2015-11-20 DIAGNOSIS — M5137 Other intervertebral disc degeneration, lumbosacral region: Secondary | ICD-10-CM | POA: Diagnosis not present

## 2015-11-20 DIAGNOSIS — M9905 Segmental and somatic dysfunction of pelvic region: Secondary | ICD-10-CM | POA: Diagnosis not present

## 2015-11-20 DIAGNOSIS — M9904 Segmental and somatic dysfunction of sacral region: Secondary | ICD-10-CM | POA: Diagnosis not present

## 2015-11-20 DIAGNOSIS — M9902 Segmental and somatic dysfunction of thoracic region: Secondary | ICD-10-CM | POA: Diagnosis not present

## 2015-11-20 DIAGNOSIS — M9901 Segmental and somatic dysfunction of cervical region: Secondary | ICD-10-CM | POA: Diagnosis not present

## 2015-11-20 DIAGNOSIS — M9903 Segmental and somatic dysfunction of lumbar region: Secondary | ICD-10-CM | POA: Diagnosis not present

## 2015-12-25 DIAGNOSIS — M5137 Other intervertebral disc degeneration, lumbosacral region: Secondary | ICD-10-CM | POA: Diagnosis not present

## 2015-12-25 DIAGNOSIS — M9901 Segmental and somatic dysfunction of cervical region: Secondary | ICD-10-CM | POA: Diagnosis not present

## 2015-12-25 DIAGNOSIS — M9904 Segmental and somatic dysfunction of sacral region: Secondary | ICD-10-CM | POA: Diagnosis not present

## 2015-12-25 DIAGNOSIS — M9903 Segmental and somatic dysfunction of lumbar region: Secondary | ICD-10-CM | POA: Diagnosis not present

## 2015-12-25 DIAGNOSIS — M9905 Segmental and somatic dysfunction of pelvic region: Secondary | ICD-10-CM | POA: Diagnosis not present

## 2015-12-25 DIAGNOSIS — M9902 Segmental and somatic dysfunction of thoracic region: Secondary | ICD-10-CM | POA: Diagnosis not present

## 2016-01-22 DIAGNOSIS — M9905 Segmental and somatic dysfunction of pelvic region: Secondary | ICD-10-CM | POA: Diagnosis not present

## 2016-01-22 DIAGNOSIS — M9902 Segmental and somatic dysfunction of thoracic region: Secondary | ICD-10-CM | POA: Diagnosis not present

## 2016-01-22 DIAGNOSIS — M5137 Other intervertebral disc degeneration, lumbosacral region: Secondary | ICD-10-CM | POA: Diagnosis not present

## 2016-01-22 DIAGNOSIS — M9901 Segmental and somatic dysfunction of cervical region: Secondary | ICD-10-CM | POA: Diagnosis not present

## 2016-01-22 DIAGNOSIS — M9903 Segmental and somatic dysfunction of lumbar region: Secondary | ICD-10-CM | POA: Diagnosis not present

## 2016-01-22 DIAGNOSIS — M9904 Segmental and somatic dysfunction of sacral region: Secondary | ICD-10-CM | POA: Diagnosis not present

## 2016-01-29 DIAGNOSIS — M545 Low back pain: Secondary | ICD-10-CM | POA: Diagnosis not present

## 2016-01-31 DIAGNOSIS — I739 Peripheral vascular disease, unspecified: Secondary | ICD-10-CM | POA: Diagnosis not present

## 2016-01-31 DIAGNOSIS — K589 Irritable bowel syndrome without diarrhea: Secondary | ICD-10-CM | POA: Diagnosis not present

## 2016-01-31 DIAGNOSIS — H2513 Age-related nuclear cataract, bilateral: Secondary | ICD-10-CM | POA: Diagnosis not present

## 2016-01-31 DIAGNOSIS — H01024 Squamous blepharitis left upper eyelid: Secondary | ICD-10-CM | POA: Diagnosis not present

## 2016-01-31 DIAGNOSIS — M419 Scoliosis, unspecified: Secondary | ICD-10-CM | POA: Diagnosis not present

## 2016-01-31 DIAGNOSIS — H01025 Squamous blepharitis left lower eyelid: Secondary | ICD-10-CM | POA: Diagnosis not present

## 2016-01-31 DIAGNOSIS — H02834 Dermatochalasis of left upper eyelid: Secondary | ICD-10-CM | POA: Diagnosis not present

## 2016-01-31 DIAGNOSIS — H43812 Vitreous degeneration, left eye: Secondary | ICD-10-CM | POA: Diagnosis not present

## 2016-01-31 DIAGNOSIS — H02831 Dermatochalasis of right upper eyelid: Secondary | ICD-10-CM | POA: Diagnosis not present

## 2016-01-31 DIAGNOSIS — H04123 Dry eye syndrome of bilateral lacrimal glands: Secondary | ICD-10-CM | POA: Diagnosis not present

## 2016-01-31 DIAGNOSIS — M545 Low back pain: Secondary | ICD-10-CM | POA: Diagnosis not present

## 2016-01-31 DIAGNOSIS — H01021 Squamous blepharitis right upper eyelid: Secondary | ICD-10-CM | POA: Diagnosis not present

## 2016-01-31 DIAGNOSIS — H01022 Squamous blepharitis right lower eyelid: Secondary | ICD-10-CM | POA: Diagnosis not present

## 2016-01-31 DIAGNOSIS — H269 Unspecified cataract: Secondary | ICD-10-CM | POA: Diagnosis not present

## 2016-02-04 DIAGNOSIS — M419 Scoliosis, unspecified: Secondary | ICD-10-CM | POA: Diagnosis not present

## 2016-02-04 DIAGNOSIS — M545 Low back pain: Secondary | ICD-10-CM | POA: Diagnosis not present

## 2016-02-19 DIAGNOSIS — M545 Low back pain: Secondary | ICD-10-CM | POA: Diagnosis not present

## 2016-02-19 DIAGNOSIS — M419 Scoliosis, unspecified: Secondary | ICD-10-CM | POA: Diagnosis not present

## 2016-02-20 DIAGNOSIS — R634 Abnormal weight loss: Secondary | ICD-10-CM | POA: Diagnosis not present

## 2016-02-20 DIAGNOSIS — R933 Abnormal findings on diagnostic imaging of other parts of digestive tract: Secondary | ICD-10-CM | POA: Diagnosis not present

## 2016-02-26 DIAGNOSIS — M419 Scoliosis, unspecified: Secondary | ICD-10-CM | POA: Diagnosis not present

## 2016-02-26 DIAGNOSIS — M545 Low back pain: Secondary | ICD-10-CM | POA: Diagnosis not present

## 2016-03-04 DIAGNOSIS — M545 Low back pain: Secondary | ICD-10-CM | POA: Diagnosis not present

## 2016-03-04 DIAGNOSIS — M419 Scoliosis, unspecified: Secondary | ICD-10-CM | POA: Diagnosis not present

## 2016-03-09 DIAGNOSIS — M9903 Segmental and somatic dysfunction of lumbar region: Secondary | ICD-10-CM | POA: Diagnosis not present

## 2016-03-09 DIAGNOSIS — M9905 Segmental and somatic dysfunction of pelvic region: Secondary | ICD-10-CM | POA: Diagnosis not present

## 2016-03-09 DIAGNOSIS — M9901 Segmental and somatic dysfunction of cervical region: Secondary | ICD-10-CM | POA: Diagnosis not present

## 2016-03-09 DIAGNOSIS — M9904 Segmental and somatic dysfunction of sacral region: Secondary | ICD-10-CM | POA: Diagnosis not present

## 2016-03-09 DIAGNOSIS — M5137 Other intervertebral disc degeneration, lumbosacral region: Secondary | ICD-10-CM | POA: Diagnosis not present

## 2016-03-09 DIAGNOSIS — M9902 Segmental and somatic dysfunction of thoracic region: Secondary | ICD-10-CM | POA: Diagnosis not present

## 2016-03-25 DIAGNOSIS — M9901 Segmental and somatic dysfunction of cervical region: Secondary | ICD-10-CM | POA: Diagnosis not present

## 2016-03-25 DIAGNOSIS — M9904 Segmental and somatic dysfunction of sacral region: Secondary | ICD-10-CM | POA: Diagnosis not present

## 2016-03-25 DIAGNOSIS — M9902 Segmental and somatic dysfunction of thoracic region: Secondary | ICD-10-CM | POA: Diagnosis not present

## 2016-03-25 DIAGNOSIS — M9903 Segmental and somatic dysfunction of lumbar region: Secondary | ICD-10-CM | POA: Diagnosis not present

## 2016-03-25 DIAGNOSIS — M5137 Other intervertebral disc degeneration, lumbosacral region: Secondary | ICD-10-CM | POA: Diagnosis not present

## 2016-03-25 DIAGNOSIS — M9905 Segmental and somatic dysfunction of pelvic region: Secondary | ICD-10-CM | POA: Diagnosis not present

## 2016-04-14 DIAGNOSIS — E7800 Pure hypercholesterolemia, unspecified: Secondary | ICD-10-CM | POA: Diagnosis not present

## 2016-04-14 DIAGNOSIS — K580 Irritable bowel syndrome with diarrhea: Secondary | ICD-10-CM | POA: Diagnosis not present

## 2016-04-14 DIAGNOSIS — M810 Age-related osteoporosis without current pathological fracture: Secondary | ICD-10-CM | POA: Diagnosis not present

## 2016-04-14 DIAGNOSIS — I10 Essential (primary) hypertension: Secondary | ICD-10-CM | POA: Diagnosis not present

## 2016-04-29 DIAGNOSIS — M9905 Segmental and somatic dysfunction of pelvic region: Secondary | ICD-10-CM | POA: Diagnosis not present

## 2016-04-29 DIAGNOSIS — M9901 Segmental and somatic dysfunction of cervical region: Secondary | ICD-10-CM | POA: Diagnosis not present

## 2016-04-29 DIAGNOSIS — M9902 Segmental and somatic dysfunction of thoracic region: Secondary | ICD-10-CM | POA: Diagnosis not present

## 2016-04-29 DIAGNOSIS — M5137 Other intervertebral disc degeneration, lumbosacral region: Secondary | ICD-10-CM | POA: Diagnosis not present

## 2016-04-29 DIAGNOSIS — M9903 Segmental and somatic dysfunction of lumbar region: Secondary | ICD-10-CM | POA: Diagnosis not present

## 2016-04-29 DIAGNOSIS — M9904 Segmental and somatic dysfunction of sacral region: Secondary | ICD-10-CM | POA: Diagnosis not present

## 2016-05-12 DIAGNOSIS — H2513 Age-related nuclear cataract, bilateral: Secondary | ICD-10-CM | POA: Diagnosis not present

## 2016-05-12 DIAGNOSIS — H5022 Vertical strabismus, left eye: Secondary | ICD-10-CM | POA: Diagnosis not present

## 2016-05-12 DIAGNOSIS — H04123 Dry eye syndrome of bilateral lacrimal glands: Secondary | ICD-10-CM | POA: Diagnosis not present

## 2016-05-12 DIAGNOSIS — H43393 Other vitreous opacities, bilateral: Secondary | ICD-10-CM | POA: Diagnosis not present

## 2016-05-20 DIAGNOSIS — M9902 Segmental and somatic dysfunction of thoracic region: Secondary | ICD-10-CM | POA: Diagnosis not present

## 2016-05-20 DIAGNOSIS — M9903 Segmental and somatic dysfunction of lumbar region: Secondary | ICD-10-CM | POA: Diagnosis not present

## 2016-05-20 DIAGNOSIS — M5137 Other intervertebral disc degeneration, lumbosacral region: Secondary | ICD-10-CM | POA: Diagnosis not present

## 2016-05-20 DIAGNOSIS — M9901 Segmental and somatic dysfunction of cervical region: Secondary | ICD-10-CM | POA: Diagnosis not present

## 2016-06-24 DIAGNOSIS — M9901 Segmental and somatic dysfunction of cervical region: Secondary | ICD-10-CM | POA: Diagnosis not present

## 2016-06-24 DIAGNOSIS — M9903 Segmental and somatic dysfunction of lumbar region: Secondary | ICD-10-CM | POA: Diagnosis not present

## 2016-06-24 DIAGNOSIS — M9902 Segmental and somatic dysfunction of thoracic region: Secondary | ICD-10-CM | POA: Diagnosis not present

## 2016-06-24 DIAGNOSIS — M5137 Other intervertebral disc degeneration, lumbosacral region: Secondary | ICD-10-CM | POA: Diagnosis not present

## 2016-08-19 DIAGNOSIS — M9901 Segmental and somatic dysfunction of cervical region: Secondary | ICD-10-CM | POA: Diagnosis not present

## 2016-08-19 DIAGNOSIS — M9903 Segmental and somatic dysfunction of lumbar region: Secondary | ICD-10-CM | POA: Diagnosis not present

## 2016-08-19 DIAGNOSIS — M5137 Other intervertebral disc degeneration, lumbosacral region: Secondary | ICD-10-CM | POA: Diagnosis not present

## 2016-08-19 DIAGNOSIS — M9902 Segmental and somatic dysfunction of thoracic region: Secondary | ICD-10-CM | POA: Diagnosis not present

## 2016-09-17 DIAGNOSIS — K580 Irritable bowel syndrome with diarrhea: Secondary | ICD-10-CM | POA: Diagnosis not present

## 2016-10-16 DIAGNOSIS — Z01419 Encounter for gynecological examination (general) (routine) without abnormal findings: Secondary | ICD-10-CM | POA: Diagnosis not present

## 2016-10-16 DIAGNOSIS — Z0000 Encounter for general adult medical examination without abnormal findings: Secondary | ICD-10-CM | POA: Diagnosis not present

## 2016-10-16 DIAGNOSIS — Z1389 Encounter for screening for other disorder: Secondary | ICD-10-CM | POA: Diagnosis not present

## 2016-10-16 DIAGNOSIS — E7800 Pure hypercholesterolemia, unspecified: Secondary | ICD-10-CM | POA: Diagnosis not present

## 2016-10-16 DIAGNOSIS — G43909 Migraine, unspecified, not intractable, without status migrainosus: Secondary | ICD-10-CM | POA: Diagnosis not present

## 2016-10-16 DIAGNOSIS — Z23 Encounter for immunization: Secondary | ICD-10-CM | POA: Diagnosis not present

## 2016-10-16 DIAGNOSIS — E222 Syndrome of inappropriate secretion of antidiuretic hormone: Secondary | ICD-10-CM | POA: Diagnosis not present

## 2016-10-16 DIAGNOSIS — I10 Essential (primary) hypertension: Secondary | ICD-10-CM | POA: Diagnosis not present

## 2016-10-16 DIAGNOSIS — K580 Irritable bowel syndrome with diarrhea: Secondary | ICD-10-CM | POA: Diagnosis not present

## 2016-10-16 DIAGNOSIS — M810 Age-related osteoporosis without current pathological fracture: Secondary | ICD-10-CM | POA: Diagnosis not present

## 2016-10-16 DIAGNOSIS — Z124 Encounter for screening for malignant neoplasm of cervix: Secondary | ICD-10-CM | POA: Diagnosis not present

## 2016-12-07 DIAGNOSIS — M810 Age-related osteoporosis without current pathological fracture: Secondary | ICD-10-CM | POA: Diagnosis not present

## 2017-01-20 DIAGNOSIS — M9902 Segmental and somatic dysfunction of thoracic region: Secondary | ICD-10-CM | POA: Diagnosis not present

## 2017-01-20 DIAGNOSIS — M9901 Segmental and somatic dysfunction of cervical region: Secondary | ICD-10-CM | POA: Diagnosis not present

## 2017-01-20 DIAGNOSIS — M9903 Segmental and somatic dysfunction of lumbar region: Secondary | ICD-10-CM | POA: Diagnosis not present

## 2017-01-20 DIAGNOSIS — M5137 Other intervertebral disc degeneration, lumbosacral region: Secondary | ICD-10-CM | POA: Diagnosis not present

## 2017-02-24 DIAGNOSIS — M5137 Other intervertebral disc degeneration, lumbosacral region: Secondary | ICD-10-CM | POA: Diagnosis not present

## 2017-02-24 DIAGNOSIS — M9901 Segmental and somatic dysfunction of cervical region: Secondary | ICD-10-CM | POA: Diagnosis not present

## 2017-02-24 DIAGNOSIS — M9903 Segmental and somatic dysfunction of lumbar region: Secondary | ICD-10-CM | POA: Diagnosis not present

## 2017-02-24 DIAGNOSIS — M9902 Segmental and somatic dysfunction of thoracic region: Secondary | ICD-10-CM | POA: Diagnosis not present

## 2017-03-24 DIAGNOSIS — M9903 Segmental and somatic dysfunction of lumbar region: Secondary | ICD-10-CM | POA: Diagnosis not present

## 2017-03-24 DIAGNOSIS — M5137 Other intervertebral disc degeneration, lumbosacral region: Secondary | ICD-10-CM | POA: Diagnosis not present

## 2017-03-24 DIAGNOSIS — M9902 Segmental and somatic dysfunction of thoracic region: Secondary | ICD-10-CM | POA: Diagnosis not present

## 2017-03-24 DIAGNOSIS — M9901 Segmental and somatic dysfunction of cervical region: Secondary | ICD-10-CM | POA: Diagnosis not present

## 2017-04-09 ENCOUNTER — Other Ambulatory Visit: Payer: Self-pay | Admitting: Family Medicine

## 2017-04-09 DIAGNOSIS — Z1231 Encounter for screening mammogram for malignant neoplasm of breast: Secondary | ICD-10-CM

## 2017-04-16 DIAGNOSIS — M810 Age-related osteoporosis without current pathological fracture: Secondary | ICD-10-CM | POA: Diagnosis not present

## 2017-04-16 DIAGNOSIS — E7800 Pure hypercholesterolemia, unspecified: Secondary | ICD-10-CM | POA: Diagnosis not present

## 2017-04-16 DIAGNOSIS — I10 Essential (primary) hypertension: Secondary | ICD-10-CM | POA: Diagnosis not present

## 2017-04-16 DIAGNOSIS — K580 Irritable bowel syndrome with diarrhea: Secondary | ICD-10-CM | POA: Diagnosis not present

## 2017-04-19 DIAGNOSIS — M5137 Other intervertebral disc degeneration, lumbosacral region: Secondary | ICD-10-CM | POA: Diagnosis not present

## 2017-04-19 DIAGNOSIS — M9901 Segmental and somatic dysfunction of cervical region: Secondary | ICD-10-CM | POA: Diagnosis not present

## 2017-04-19 DIAGNOSIS — M9903 Segmental and somatic dysfunction of lumbar region: Secondary | ICD-10-CM | POA: Diagnosis not present

## 2017-04-19 DIAGNOSIS — M9902 Segmental and somatic dysfunction of thoracic region: Secondary | ICD-10-CM | POA: Diagnosis not present

## 2017-04-28 ENCOUNTER — Ambulatory Visit
Admission: RE | Admit: 2017-04-28 | Discharge: 2017-04-28 | Disposition: A | Discharge status: Home / Self Care | Payer: Medicare Other | Source: Ambulatory Visit | Attending: Family Medicine | Admitting: Family Medicine

## 2017-04-28 DIAGNOSIS — Z1231 Encounter for screening mammogram for malignant neoplasm of breast: Secondary | ICD-10-CM | POA: Diagnosis not present

## 2017-05-12 DIAGNOSIS — H5022 Vertical strabismus, left eye: Secondary | ICD-10-CM | POA: Diagnosis not present

## 2017-05-12 DIAGNOSIS — H04123 Dry eye syndrome of bilateral lacrimal glands: Secondary | ICD-10-CM | POA: Diagnosis not present

## 2017-05-12 DIAGNOSIS — H10413 Chronic giant papillary conjunctivitis, bilateral: Secondary | ICD-10-CM | POA: Diagnosis not present

## 2017-05-12 DIAGNOSIS — H2513 Age-related nuclear cataract, bilateral: Secondary | ICD-10-CM | POA: Diagnosis not present

## 2017-05-19 DIAGNOSIS — M9902 Segmental and somatic dysfunction of thoracic region: Secondary | ICD-10-CM | POA: Diagnosis not present

## 2017-05-19 DIAGNOSIS — M5137 Other intervertebral disc degeneration, lumbosacral region: Secondary | ICD-10-CM | POA: Diagnosis not present

## 2017-05-19 DIAGNOSIS — M9901 Segmental and somatic dysfunction of cervical region: Secondary | ICD-10-CM | POA: Diagnosis not present

## 2017-05-19 DIAGNOSIS — M9903 Segmental and somatic dysfunction of lumbar region: Secondary | ICD-10-CM | POA: Diagnosis not present

## 2017-06-24 DIAGNOSIS — M9903 Segmental and somatic dysfunction of lumbar region: Secondary | ICD-10-CM | POA: Diagnosis not present

## 2017-06-24 DIAGNOSIS — M5137 Other intervertebral disc degeneration, lumbosacral region: Secondary | ICD-10-CM | POA: Diagnosis not present

## 2017-06-24 DIAGNOSIS — M9901 Segmental and somatic dysfunction of cervical region: Secondary | ICD-10-CM | POA: Diagnosis not present

## 2017-06-24 DIAGNOSIS — M9902 Segmental and somatic dysfunction of thoracic region: Secondary | ICD-10-CM | POA: Diagnosis not present

## 2017-08-25 DIAGNOSIS — M9902 Segmental and somatic dysfunction of thoracic region: Secondary | ICD-10-CM | POA: Diagnosis not present

## 2017-08-25 DIAGNOSIS — M9903 Segmental and somatic dysfunction of lumbar region: Secondary | ICD-10-CM | POA: Diagnosis not present

## 2017-08-25 DIAGNOSIS — M9901 Segmental and somatic dysfunction of cervical region: Secondary | ICD-10-CM | POA: Diagnosis not present

## 2017-08-25 DIAGNOSIS — M5137 Other intervertebral disc degeneration, lumbosacral region: Secondary | ICD-10-CM | POA: Diagnosis not present

## 2017-09-22 DIAGNOSIS — M9903 Segmental and somatic dysfunction of lumbar region: Secondary | ICD-10-CM | POA: Diagnosis not present

## 2017-09-22 DIAGNOSIS — M9902 Segmental and somatic dysfunction of thoracic region: Secondary | ICD-10-CM | POA: Diagnosis not present

## 2017-09-22 DIAGNOSIS — M9901 Segmental and somatic dysfunction of cervical region: Secondary | ICD-10-CM | POA: Diagnosis not present

## 2017-09-22 DIAGNOSIS — M5137 Other intervertebral disc degeneration, lumbosacral region: Secondary | ICD-10-CM | POA: Diagnosis not present

## 2017-09-24 DIAGNOSIS — K580 Irritable bowel syndrome with diarrhea: Secondary | ICD-10-CM | POA: Diagnosis not present

## 2017-09-24 DIAGNOSIS — K210 Gastro-esophageal reflux disease with esophagitis: Secondary | ICD-10-CM | POA: Diagnosis not present

## 2017-10-27 DIAGNOSIS — M5137 Other intervertebral disc degeneration, lumbosacral region: Secondary | ICD-10-CM | POA: Diagnosis not present

## 2017-10-27 DIAGNOSIS — M9903 Segmental and somatic dysfunction of lumbar region: Secondary | ICD-10-CM | POA: Diagnosis not present

## 2017-10-27 DIAGNOSIS — M9902 Segmental and somatic dysfunction of thoracic region: Secondary | ICD-10-CM | POA: Diagnosis not present

## 2017-10-27 DIAGNOSIS — M9901 Segmental and somatic dysfunction of cervical region: Secondary | ICD-10-CM | POA: Diagnosis not present

## 2017-11-24 DIAGNOSIS — M5137 Other intervertebral disc degeneration, lumbosacral region: Secondary | ICD-10-CM | POA: Diagnosis not present

## 2017-11-24 DIAGNOSIS — M9903 Segmental and somatic dysfunction of lumbar region: Secondary | ICD-10-CM | POA: Diagnosis not present

## 2017-11-24 DIAGNOSIS — M9901 Segmental and somatic dysfunction of cervical region: Secondary | ICD-10-CM | POA: Diagnosis not present

## 2017-11-24 DIAGNOSIS — M9902 Segmental and somatic dysfunction of thoracic region: Secondary | ICD-10-CM | POA: Diagnosis not present

## 2017-12-22 DIAGNOSIS — M9903 Segmental and somatic dysfunction of lumbar region: Secondary | ICD-10-CM | POA: Diagnosis not present

## 2017-12-22 DIAGNOSIS — M9902 Segmental and somatic dysfunction of thoracic region: Secondary | ICD-10-CM | POA: Diagnosis not present

## 2017-12-22 DIAGNOSIS — M9901 Segmental and somatic dysfunction of cervical region: Secondary | ICD-10-CM | POA: Diagnosis not present

## 2017-12-22 DIAGNOSIS — M5137 Other intervertebral disc degeneration, lumbosacral region: Secondary | ICD-10-CM | POA: Diagnosis not present

## 2018-03-22 DIAGNOSIS — M1990 Unspecified osteoarthritis, unspecified site: Secondary | ICD-10-CM | POA: Diagnosis not present

## 2018-03-22 DIAGNOSIS — I10 Essential (primary) hypertension: Secondary | ICD-10-CM | POA: Diagnosis not present

## 2018-03-22 DIAGNOSIS — J309 Allergic rhinitis, unspecified: Secondary | ICD-10-CM | POA: Diagnosis not present

## 2018-03-22 DIAGNOSIS — F39 Unspecified mood [affective] disorder: Secondary | ICD-10-CM | POA: Diagnosis not present

## 2018-03-22 DIAGNOSIS — G43909 Migraine, unspecified, not intractable, without status migrainosus: Secondary | ICD-10-CM | POA: Diagnosis not present

## 2018-03-22 DIAGNOSIS — E7800 Pure hypercholesterolemia, unspecified: Secondary | ICD-10-CM | POA: Diagnosis not present

## 2018-03-22 DIAGNOSIS — M810 Age-related osteoporosis without current pathological fracture: Secondary | ICD-10-CM | POA: Diagnosis not present

## 2018-03-22 DIAGNOSIS — Z1159 Encounter for screening for other viral diseases: Secondary | ICD-10-CM | POA: Diagnosis not present

## 2018-03-22 DIAGNOSIS — Z0000 Encounter for general adult medical examination without abnormal findings: Secondary | ICD-10-CM | POA: Diagnosis not present

## 2018-03-22 DIAGNOSIS — Z1389 Encounter for screening for other disorder: Secondary | ICD-10-CM | POA: Diagnosis not present

## 2018-03-22 DIAGNOSIS — K580 Irritable bowel syndrome with diarrhea: Secondary | ICD-10-CM | POA: Diagnosis not present

## 2018-09-28 DIAGNOSIS — I10 Essential (primary) hypertension: Secondary | ICD-10-CM | POA: Diagnosis not present

## 2018-09-28 DIAGNOSIS — M810 Age-related osteoporosis without current pathological fracture: Secondary | ICD-10-CM | POA: Diagnosis not present

## 2018-09-28 DIAGNOSIS — K580 Irritable bowel syndrome with diarrhea: Secondary | ICD-10-CM | POA: Diagnosis not present

## 2018-09-28 DIAGNOSIS — E7800 Pure hypercholesterolemia, unspecified: Secondary | ICD-10-CM | POA: Diagnosis not present

## 2018-10-03 DIAGNOSIS — M810 Age-related osteoporosis without current pathological fracture: Secondary | ICD-10-CM | POA: Diagnosis not present

## 2018-10-03 DIAGNOSIS — Z23 Encounter for immunization: Secondary | ICD-10-CM | POA: Diagnosis not present

## 2018-12-21 DIAGNOSIS — K580 Irritable bowel syndrome with diarrhea: Secondary | ICD-10-CM | POA: Diagnosis not present

## 2018-12-21 DIAGNOSIS — Z1211 Encounter for screening for malignant neoplasm of colon: Secondary | ICD-10-CM | POA: Diagnosis not present

## 2018-12-21 DIAGNOSIS — K2100 Gastro-esophageal reflux disease with esophagitis, without bleeding: Secondary | ICD-10-CM | POA: Diagnosis not present

## 2019-04-10 DIAGNOSIS — K2100 Gastro-esophageal reflux disease with esophagitis, without bleeding: Secondary | ICD-10-CM | POA: Diagnosis not present

## 2019-04-10 DIAGNOSIS — K580 Irritable bowel syndrome with diarrhea: Secondary | ICD-10-CM | POA: Diagnosis not present

## 2019-04-10 DIAGNOSIS — F39 Unspecified mood [affective] disorder: Secondary | ICD-10-CM | POA: Diagnosis not present

## 2019-04-10 DIAGNOSIS — Z0000 Encounter for general adult medical examination without abnormal findings: Secondary | ICD-10-CM | POA: Diagnosis not present

## 2019-04-10 DIAGNOSIS — Z23 Encounter for immunization: Secondary | ICD-10-CM | POA: Diagnosis not present

## 2019-04-10 DIAGNOSIS — M810 Age-related osteoporosis without current pathological fracture: Secondary | ICD-10-CM | POA: Diagnosis not present

## 2019-04-10 DIAGNOSIS — E7800 Pure hypercholesterolemia, unspecified: Secondary | ICD-10-CM | POA: Diagnosis not present

## 2019-04-10 DIAGNOSIS — J309 Allergic rhinitis, unspecified: Secondary | ICD-10-CM | POA: Diagnosis not present

## 2019-04-10 DIAGNOSIS — Z1389 Encounter for screening for other disorder: Secondary | ICD-10-CM | POA: Diagnosis not present

## 2019-04-10 DIAGNOSIS — I10 Essential (primary) hypertension: Secondary | ICD-10-CM | POA: Diagnosis not present

## 2019-04-17 ENCOUNTER — Other Ambulatory Visit: Payer: Self-pay | Admitting: Family Medicine

## 2019-04-17 DIAGNOSIS — M810 Age-related osteoporosis without current pathological fracture: Secondary | ICD-10-CM

## 2019-04-17 DIAGNOSIS — Z1231 Encounter for screening mammogram for malignant neoplasm of breast: Secondary | ICD-10-CM

## 2019-04-17 DIAGNOSIS — M545 Low back pain: Secondary | ICD-10-CM | POA: Diagnosis not present

## 2019-05-01 DIAGNOSIS — M545 Low back pain: Secondary | ICD-10-CM | POA: Diagnosis not present

## 2019-05-15 DIAGNOSIS — M5416 Radiculopathy, lumbar region: Secondary | ICD-10-CM | POA: Diagnosis not present

## 2019-05-17 DIAGNOSIS — H2513 Age-related nuclear cataract, bilateral: Secondary | ICD-10-CM | POA: Diagnosis not present

## 2019-05-17 DIAGNOSIS — H5022 Vertical strabismus, left eye: Secondary | ICD-10-CM | POA: Diagnosis not present

## 2019-05-17 DIAGNOSIS — H04123 Dry eye syndrome of bilateral lacrimal glands: Secondary | ICD-10-CM | POA: Diagnosis not present

## 2019-05-17 DIAGNOSIS — H10413 Chronic giant papillary conjunctivitis, bilateral: Secondary | ICD-10-CM | POA: Diagnosis not present

## 2019-06-06 DIAGNOSIS — M5416 Radiculopathy, lumbar region: Secondary | ICD-10-CM | POA: Diagnosis not present

## 2019-06-27 ENCOUNTER — Other Ambulatory Visit: Payer: Self-pay

## 2019-06-27 ENCOUNTER — Ambulatory Visit
Admission: RE | Admit: 2019-06-27 | Discharge: 2019-06-27 | Disposition: A | Discharge status: Home / Self Care | Payer: Medicare Other | Source: Ambulatory Visit | Attending: Family Medicine | Admitting: Family Medicine

## 2019-06-27 DIAGNOSIS — Z1231 Encounter for screening mammogram for malignant neoplasm of breast: Secondary | ICD-10-CM

## 2019-06-27 DIAGNOSIS — M810 Age-related osteoporosis without current pathological fracture: Secondary | ICD-10-CM

## 2019-10-12 DIAGNOSIS — Z23 Encounter for immunization: Secondary | ICD-10-CM | POA: Diagnosis not present

## 2019-10-12 DIAGNOSIS — E7800 Pure hypercholesterolemia, unspecified: Secondary | ICD-10-CM | POA: Diagnosis not present

## 2019-10-12 DIAGNOSIS — I10 Essential (primary) hypertension: Secondary | ICD-10-CM | POA: Diagnosis not present

## 2019-10-12 DIAGNOSIS — K580 Irritable bowel syndrome with diarrhea: Secondary | ICD-10-CM | POA: Diagnosis not present

## 2019-10-12 DIAGNOSIS — M545 Low back pain: Secondary | ICD-10-CM | POA: Diagnosis not present

## 2019-10-12 DIAGNOSIS — M810 Age-related osteoporosis without current pathological fracture: Secondary | ICD-10-CM | POA: Diagnosis not present

## 2019-10-24 DIAGNOSIS — M4316 Spondylolisthesis, lumbar region: Secondary | ICD-10-CM | POA: Diagnosis not present

## 2019-10-24 DIAGNOSIS — M4180 Other forms of scoliosis, site unspecified: Secondary | ICD-10-CM | POA: Diagnosis not present

## 2019-10-24 DIAGNOSIS — M48061 Spinal stenosis, lumbar region without neurogenic claudication: Secondary | ICD-10-CM | POA: Diagnosis not present

## 2019-10-25 ENCOUNTER — Other Ambulatory Visit: Payer: Self-pay | Admitting: Neurosurgery

## 2019-10-25 DIAGNOSIS — M4316 Spondylolisthesis, lumbar region: Secondary | ICD-10-CM

## 2019-11-08 ENCOUNTER — Ambulatory Visit
Admission: RE | Admit: 2019-11-08 | Discharge: 2019-11-08 | Disposition: A | Discharge status: Home / Self Care | Payer: Medicare Other | Source: Ambulatory Visit | Attending: Neurosurgery | Admitting: Neurosurgery

## 2019-11-08 ENCOUNTER — Other Ambulatory Visit: Payer: Self-pay

## 2019-11-08 DIAGNOSIS — M4316 Spondylolisthesis, lumbar region: Secondary | ICD-10-CM

## 2019-11-08 DIAGNOSIS — M5126 Other intervertebral disc displacement, lumbar region: Secondary | ICD-10-CM | POA: Diagnosis not present

## 2019-11-08 DIAGNOSIS — M48061 Spinal stenosis, lumbar region without neurogenic claudication: Secondary | ICD-10-CM | POA: Diagnosis not present

## 2019-11-08 DIAGNOSIS — M4186 Other forms of scoliosis, lumbar region: Secondary | ICD-10-CM | POA: Diagnosis not present

## 2019-11-17 DIAGNOSIS — M48061 Spinal stenosis, lumbar region without neurogenic claudication: Secondary | ICD-10-CM | POA: Diagnosis not present

## 2019-11-17 DIAGNOSIS — M4180 Other forms of scoliosis, site unspecified: Secondary | ICD-10-CM | POA: Diagnosis not present

## 2019-11-17 DIAGNOSIS — M4316 Spondylolisthesis, lumbar region: Secondary | ICD-10-CM | POA: Diagnosis not present

## 2019-11-17 DIAGNOSIS — I10 Essential (primary) hypertension: Secondary | ICD-10-CM | POA: Diagnosis not present

## 2019-11-21 ENCOUNTER — Other Ambulatory Visit: Payer: Self-pay | Admitting: Neurosurgery

## 2019-11-22 ENCOUNTER — Other Ambulatory Visit: Payer: Self-pay | Admitting: Neurosurgery

## 2019-11-28 DIAGNOSIS — Z23 Encounter for immunization: Secondary | ICD-10-CM | POA: Diagnosis not present

## 2019-12-04 ENCOUNTER — Other Ambulatory Visit: Payer: Self-pay

## 2019-12-04 ENCOUNTER — Encounter (HOSPITAL_COMMUNITY): Payer: Self-pay

## 2019-12-04 ENCOUNTER — Encounter (HOSPITAL_COMMUNITY)
Admission: RE | Admit: 2019-12-04 | Discharge: 2019-12-04 | Disposition: A | Discharge status: Home / Self Care | Payer: Medicare Other | Source: Ambulatory Visit | Attending: Neurosurgery | Admitting: Neurosurgery

## 2019-12-04 ENCOUNTER — Other Ambulatory Visit (HOSPITAL_COMMUNITY)
Admission: RE | Admit: 2019-12-04 | Discharge: 2019-12-04 | Disposition: A | Discharge status: Home / Self Care | Payer: Medicare Other | Source: Ambulatory Visit | Attending: General Surgery | Admitting: General Surgery

## 2019-12-04 DIAGNOSIS — Z20822 Contact with and (suspected) exposure to covid-19: Secondary | ICD-10-CM | POA: Diagnosis not present

## 2019-12-04 DIAGNOSIS — I10 Essential (primary) hypertension: Secondary | ICD-10-CM | POA: Diagnosis not present

## 2019-12-04 DIAGNOSIS — Z01812 Encounter for preprocedural laboratory examination: Secondary | ICD-10-CM | POA: Insufficient documentation

## 2019-12-04 DIAGNOSIS — Z01818 Encounter for other preprocedural examination: Secondary | ICD-10-CM | POA: Diagnosis not present

## 2019-12-04 HISTORY — DX: Headache, unspecified: R51.9

## 2019-12-04 HISTORY — DX: Anxiety disorder, unspecified: F41.9

## 2019-12-04 LAB — TYPE AND SCREEN
ABO/RH(D): A NEG
Antibody Screen: NEGATIVE

## 2019-12-04 LAB — BASIC METABOLIC PANEL
Anion gap: 12 (ref 5–15)
BUN: <5 mg/dL — ABNORMAL LOW (ref 8–23)
CO2: 21 mmol/L — ABNORMAL LOW (ref 22–32)
Calcium: 9.4 mg/dL (ref 8.9–10.3)
Chloride: 103 mmol/L (ref 98–111)
Creatinine, Ser: 0.71 mg/dL (ref 0.44–1.00)
GFR, Estimated: >60 mL/min (ref >60)
Glucose, Bld: 90 mg/dL (ref 70–99)
Potassium: 4 mmol/L (ref 3.5–5.1)
Sodium: 136 mmol/L (ref 135–145)

## 2019-12-04 LAB — CBC
HCT: 42 % (ref 36.0–46.0)
Hemoglobin: 13.3 g/dL (ref 12.0–15.0)
MCH: 31.1 pg (ref 26.0–34.0)
MCHC: 31.7 g/dL (ref 30.0–36.0)
MCV: 98.4 fL (ref 80.0–100.0)
Platelets: 380 10*3/uL (ref 150–400)
RBC: 4.27 MIL/uL (ref 3.87–5.11)
RDW: 11.7 % (ref 11.5–15.5)
WBC: 7.1 10*3/uL (ref 4.0–10.5)
nRBC: 0 % (ref 0.0–0.2)

## 2019-12-04 LAB — SURGICAL PCR SCREEN
MRSA, PCR: NEGATIVE
Staphylococcus aureus: NEGATIVE

## 2019-12-04 LAB — SARS CORONAVIRUS 2 (TAT 6-24 HRS): SARS Coronavirus 2: NEGATIVE

## 2019-12-04 MED ORDER — CHLORHEXIDINE GLUCONATE CLOTH 2 % EX PADS: 6.0000 | MEDICATED_PAD | Freq: Once | CUTANEOUS | Status: DC

## 2019-12-04 NOTE — Progress Notes (Signed)
PCP - DR GRIFFIN     MED CLEARANCE IN CHART Cardiologist - NA    Chest x-ray - NA EKG - DONE TODAY Stress Test -NA  ECHO - NA Cardiac Cath - NA                 Blood Thinner Instructions:NA Aspirin Instructions:STOP  OVID TEST- FOR TODAY   Anesthesia review: HTN  Patient denies shortness of breath, fever, cough and chest pain at PAT appointment   All instructions explained to the patient, with a verbal understanding of the material. Patient agrees to go over the instructions while at home for a better understanding. Patient also instructed to self quarantine after being tested for COVID-19. The opportunity to ask questions was provided.

## 2019-12-04 NOTE — Pre-Procedure Instructions (Signed)
Cynthia Bradshaw  12/04/2019      [facility] - [place] - [address] [place] [address] Phone: [phone] Fax: [phone]    Your procedure is scheduled on 12/07/19.  Report to [facility] Admitting at 530 A.M.  Call this number if you have problems the morning of surgery:  [phone]   Remember:  Do not eat or drink after midnight.  Y Take these medicines the morning of surgery with A SIP OF WATER ----METOPROLOL,PRILOSEC,PROTONIX    Do not wear jewelry, make-up or nail polish.  Do not wear lotions, powders, or perfumes, or deodorant.  Do not shave 48 hours prior to surgery.  Men may shave face and neck.  Do not bring valuables to the hospital.  [facility] is not responsible for any belongings or valuables.  Contacts, dentures or bridgework may not be worn into surgery.  Leave your suitcase in the car.  After surgery it may be brought to your room.  For patients admitted to the hospital, discharge time will be determined by your treatment team.  Patients discharged the day of surgery will not be allowed to drive home.   Do not take any aspirin,anti-inflammatories,vitamins,or herbal supplements 5-7 days prior to surgery.    [facility] - Preparing for Surgery  Before surgery, you can play an important role.  Because skin is not sterile, your skin needs to be as free of germs as possible.  You can reduce the number of germs on you skin by washing with CHG (chlorahexidine gluconate) soap before surgery.  CHG is an antiseptic cleaner which kills germs and bonds with the skin to continue killing germs even after washing.  Oral Hygiene is also important in reducing the risk of infection.  Remember to brush your teeth with your regular toothpaste the morning of surgery.  Please DO NOT use if you have an allergy to CHG or antibacterial soaps.  If your skin becomes reddened/irritated stop using the CHG and inform your  nurse when you arrive at Short Stay.  Do not shave (including legs and underarms) for at least 48 hours prior to the first CHG shower.  You may shave your face.  Please follow these instructions carefully:   1.  Shower with CHG Soap the night before surgery and the morning of Surgery.  2.  If you choose to wash your hair, wash your hair first as usual with your normal shampoo.  3.  After you shampoo, rinse your hair and body thoroughly to remove the shampoo. 4.  Use CHG as you would any other liquid soap.  You can apply chg directly to the skin and wash gently with a      scrungie or washcloth.           5.  Apply the CHG Soap to your body ONLY FROM THE NECK DOWN.   Do not use on open wounds or open sores. Avoid contact with your eyes, ears, mouth and genitals (private parts).  Wash genitals (private parts) with your normal soap.  6.  Wash thoroughly, paying special attention to the area where your surgery will be performed.  7.  Thoroughly rinse your body with warm water from the neck down.  8.  DO NOT shower/wash with your normal soap after using and rinsing off the CHG Soap.  9.  Pat yourself dry with a clean towel.  10.  Wear clean pajamas.            11.  Place clean sheets on your bed the night of your first shower and do not sleep with pets.  Day of Surgery  Do not apply any lotions/deoderants the morning of surgery.   Please wear clean clothes to the hospital/surgery center. Remember to brush your teeth with toothpaste.  Please read over the following fact sheets that you were given. Coughing and Deep Breathing

## 2019-12-05 ENCOUNTER — Encounter (HOSPITAL_COMMUNITY): Payer: Self-pay | Admitting: Physician Assistant

## 2019-12-05 NOTE — Progress Notes (Signed)
Anesthesia Chart Review:  New left bundle branch block on preop EKG. Pt denied any CV symptoms. Spoke with patient's PCP Dr. [name].  This appears to be a new finding.  Patient to be seen by cardiology prior to surgery.  Surgery will be postponed to allow for this. Surgeon's office aware.  [facility] Short Stay Center/Anesthesiology Phone [phone] 12/06/2019 12:14 PM

## 2019-12-07 ENCOUNTER — Inpatient Hospital Stay (HOSPITAL_COMMUNITY): Admission: RE | Admit: 2019-12-07 | Payer: Medicare Other | Source: Home / Self Care

## 2019-12-07 ENCOUNTER — Telehealth: Payer: Self-pay

## 2019-12-07 ENCOUNTER — Encounter (HOSPITAL_COMMUNITY): Admission: RE | Payer: Self-pay | Source: Home / Self Care

## 2019-12-07 SURGERY — ANTERIOR LATERAL LUMBAR FUSION 3 LEVELS
Anesthesia: General | Laterality: Right

## 2019-12-07 NOTE — Telephone Encounter (Signed)
CORRECT FAX # IS [phone]

## 2019-12-07 NOTE — Telephone Encounter (Signed)
This patient has an appointment 12/03- please make sure MD knows to comment on pre op clearance.  Corine Shelter PA-C 12/07/2019 11:36 AM

## 2019-12-07 NOTE — Telephone Encounter (Signed)
appt's notes reflect pre op clearance is needed. Will forward clearance notes to MD for upcoming appt. Will send FYI to requesting office pt has appt 12/22/19. Will remove from the pre op call back pool.

## 2019-12-07 NOTE — Telephone Encounter (Signed)
   [facility] Pre-operative Risk Assessment    Request for surgical clearance:  1. What type of surgery is being performed?  LUMBAR FUSION-LLIF L23, L34, L45,RT,PERC POSTERIOR   2. When is this surgery scheduled? TBD   3. What type of clearance is required (medical clearance vs. Pharmacy clearance to hold med vs. Both)? MEDICAL  4. Are there any medications that need to be held prior to surgery and how long? NONE LISTED   5. Practice name and name of physician performing surgery? [facility] ATTN:MIKKI   6. What is the office phone number? [phone]   7.   What is the office fax number? [phone]  8.   Anesthesia type (None, local, MAC, general) ? GENERAL

## 2019-12-22 ENCOUNTER — Other Ambulatory Visit: Payer: Self-pay

## 2019-12-22 ENCOUNTER — Encounter: Payer: Self-pay | Admitting: Cardiology

## 2019-12-22 ENCOUNTER — Ambulatory Visit (INDEPENDENT_AMBULATORY_CARE_PROVIDER_SITE_OTHER): Payer: Medicare Other | Admitting: Cardiology

## 2019-12-22 VITALS — BP 180/110 | HR 81 | Ht 64.0 in | Wt 122.4 lb

## 2019-12-22 DIAGNOSIS — I10 Essential (primary) hypertension: Secondary | ICD-10-CM | POA: Diagnosis not present

## 2019-12-22 DIAGNOSIS — Z01810 Encounter for preprocedural cardiovascular examination: Secondary | ICD-10-CM

## 2019-12-22 DIAGNOSIS — Z7189 Other specified counseling: Secondary | ICD-10-CM

## 2019-12-22 DIAGNOSIS — I447 Left bundle-branch block, unspecified: Secondary | ICD-10-CM | POA: Diagnosis not present

## 2019-12-22 NOTE — Patient Instructions (Addendum)
Medication Instructions:  We will wean down on metoprolol succinate.  -Take 25 mg (1 tablet) once a day for a week -then take 12.5 mg (1/2 tablet) once a day for a week -then stop  For now, keep benazepril at the same dose. If your blood pressures at home are consistently >140/90, call me and we will go up on the benazepril.  *If you need a refill on your cardiac medications before your next appointment, please call your pharmacy*   Lab Work: None  Testing/Procedures: Your physician has requested that you have an echocardiogram. Echocardiography is a painless test that uses sound waves to create images of your heart. It provides your doctor with information about the size and shape of your heart and how well your heart's chambers and valves are working. This procedure takes approximately one hour. There are no restrictions for this procedure. [address]    Follow-Up: At [facility], you and your health needs are our priority.  As part of our continuing mission to provide you with exceptional heart care, we have created designated Provider Care Teams.  These Care Teams include your primary Cardiologist (physician) and Advanced Practice Providers (APPs -  Physician Assistants and Nurse Practitioners) who all work together to provide you with the care you need, when you need it.  We recommend signing up for the patient portal called "MyChart".  Sign up information is provided on this After Visit Summary.  MyChart is used to connect with patients for Virtual Visits (Telemedicine).  Patients are able to view lab/test results, encounter notes, upcoming appointments, etc.  Non-urgent messages can be sent to your provider as well.   To learn more about what you can do with MyChart, go to [email or web address].    Your next appointment:   2 year(s)  The format for your next appointment:   In Person  Provider:   Jodelle Red, MD

## 2019-12-22 NOTE — Progress Notes (Signed)
Cardiology Office Note:    Date:  12/22/2019   ID:  Cynthia Bradshaw, DOB [birth year], MRN [id number]  PCP:  [name], MD  Cardiologist:  Jodelle Red, MD  Referring MD: [name], MD   CC: new patient evaluation for preoperative risk and LBBB  History of Present Illness:    Cynthia Bradshaw is a 74 y.o. female with a hx of hypertension, hyperlipidemia who is seen as a new consult at the request of [name], MD for the evaluation and management of preoperative cardiovascular evaluation, LBBB.  Per referral notes, she had an ECG done 12/04/19 with LBBB. Has no prior ECGs on file. Was scheduled for surgery on 12/07/19, this was placed on hold given her abnormal ECG until she could be seen by cardiology.  Planned surgery: back surgery with Dr. Maisie Fus, date TBD.  Pertinent past cardiac history:  Prior cardiac workup: none except recent ECG. No memory of having ECG prior to recent preop evaluation History of valve disease: none History of CAD/PAD/CVA/TIA: none that she is aware of, told she may have had a mini stroke in the past. History of heart failure: none History of arrhythmia: none On anticoagulation: none History of hypertension: yes. White coat hypertension. Checked blood pressure for three weeks at home with normal readings. Had her metoprolol increased after ECG was done. Her ears have been ringing more since then. History of diabetes: none History of CKD: none History of OSA: none History of anesthesia complications: none Current symptoms: Denies chest pain, shortness of breath at rest or with normal exertion. No PND, orthopnea, LE edema or unexpected weight gain. No syncope or palpitations. Functional capacity: pulling weeds, being active in the garden. No symptoms with this. Has stairs in the house, can climb without issue.  Past Medical History:  Diagnosis Date   Anxiety    Headache    High cholesterol    Hypertension    IBS  (irritable bowel syndrome)     Past Surgical History:  Procedure Laterality Date   ABDOMINAL SURGERY     BREAST EXCISIONAL BIOPSY Left    COLON SURGERY      Current Medications: Current Outpatient Medications on File Prior to Visit  Medication Sig   atorvastatin (LIPITOR) 20 MG tablet Take 20 mg by mouth daily.   benazepril (LOTENSIN) 10 MG tablet Take 10 mg by mouth daily.   cholecalciferol (VITAMIN D) 1000 units tablet Take 1,000 Units by mouth daily.    denosumab (PROLIA) 60 MG/ML SOSY injection Inject 60 mg into the skin every 6 (six) months.   dicyclomine (BENTYL) 20 MG tablet Take 20 mg by mouth daily.    metoprolol succinate (TOPROL-XL) 25 MG 24 hr tablet Take 50 mg by mouth daily.    Multiple Vitamin (MULTIVITAMIN WITH MINERALS) TABS tablet Take 1 tablet by mouth daily.   Omega-3 Fatty Acids (FISH OIL ADULT GUMMIES PO) Take by mouth.   omeprazole (PRILOSEC) 20 MG capsule Take 20 mg by mouth daily.   polyethylene glycol (MIRALAX / GLYCOLAX) 17 g packet Take 17 g by mouth 2 (two) times a week.   POTASSIUM PO Take 50 mg by mouth as needed.   Probiotic Product (DIGESTIVE ADVANTAGE) CAPS Take 1 capsule by mouth daily.   rizatriptan (MAXALT) 10 MG tablet Take 10 mg by mouth as needed for migraine. May repeat in 2 hours if needed   vitamin E 180 MG (400 UNITS) capsule Take 400 Units by mouth daily.   No current facility-administered  medications on file prior to visit.     Allergies:   Patient has no known allergies.   Social History   Tobacco Use   Smoking status: Never Smoker   Smokeless tobacco: Never Used  Substance Use Topics   Alcohol use: No   Drug use: No    Family History: family history is negative for Breast cancer.  ROS:   Please see the history of present illness.  Additional pertinent ROS: Constitutional: Negative for chills, fever, night sweats, unintentional weight loss  HENT: Negative for ear pain and hearing loss.   Eyes: Negative for loss of vision  and eye pain.  Respiratory: Negative for cough, sputum, wheezing.   Cardiovascular: See HPI. Gastrointestinal: Negative for abdominal pain, melena, and hematochezia.  Genitourinary: Negative for dysuria and hematuria.  Musculoskeletal: Negative for falls and myalgias.  Skin: Negative for itching and rash.  Neurological: Negative for focal weakness, focal sensory changes and loss of consciousness.  Endo/Heme/Allergies: Does not bruise/bleed easily.     EKGs/Labs/Other Studies Reviewed:    The following studies were reviewed today: No prior cardiac studies  EKG:  EKG is personally reviewed.  The ekg ordered today demonstrates NSR, LBBB at 81 bpm  Recent Labs: 12/04/2019: BUN <5; Creatinine, Ser 0.71; Hemoglobin 13.3; Platelets 380; Potassium 4.0; Sodium 136  Recent Lipid Panel No results found for: CHOL, TRIG, HDL, CHOLHDL, VLDL, LDLCALC, LDLDIRECT  Physical Exam:    VS:  BP (!) 180/110 (BP Location: Left Arm, Patient Position: Sitting)   Pulse 81   Ht 5\' 4"  (1.626 m)   Wt 122 lb 6.4 oz (55.5 kg)   SpO2 97%   BMI 21.01 kg/m     Wt Readings from Last 3 Encounters:  12/22/19 122 lb 6.4 oz (55.5 kg)  12/04/19 121 lb 1.6 oz (54.9 kg)  09/24/15 100 lb (45.4 kg)    GEN: Well nourished, well developed in no acute distress HEENT: Normal, moist mucous membranes NECK: No JVD CARDIAC: regular rhythm, normal S1 and S2, no rubs or gallops. No murmur. VASCULAR: Radial and DP pulses 2+ bilaterally. No carotid bruits RESPIRATORY:  Clear to auscultation without rales, wheezing or rhonchi  ABDOMEN: Soft, non-tender, non-distended MUSCULOSKELETAL:  Ambulates independently SKIN: Warm and dry, no edema NEUROLOGIC:  Alert and oriented x 3. No focal neuro deficits noted. PSYCHIATRIC:  Normal affect    ASSESSMENT:    1. LBBB (left bundle branch block)   2. Preop cardiovascular exam   3. Essential hypertension   4. Cardiac risk counseling   5. Counseling on health promotion and disease  prevention    PLAN:    Preoperative cardiovascular risk assessment New LBBB -will order echocardiogram for further evaluation -if echo unremarkable, her RCRI risk is 0. The patient is not currently having active cardiac symptoms, and they can achieve >4 METs of activity. According to ACC/AHA Guidelines, no further testing is needed.  Proceed with surgery at acceptable risk.  Our service is available as needed in the peri-operative period.    Hypertension: -very elevated in office today. Reports normal at home.  -we will wean off the metoprolol (given increased ringing in her ears since starting) and for now keep benazepril at the same dose. However, if home BP rises, will need to adjust medications further  Cardiac risk counseling and prevention recommendations: -recommend heart healthy/Mediterranean diet, with whole grains, fruits, vegetable, fish, lean meats, nuts, and olive oil. Limit salt. -recommend moderate walking, 3-5 times/week for 30-50 minutes each session. Aim for  at least 150 minutes.week. Goal should be pace of 3 miles/hours, or walking 1.5 miles in 30 minutes -recommend avoidance of tobacco products. Avoid excess alcohol. -ASCVD risk score: The ASCVD Risk score Denman George DC Jr., et al., 2013) failed to calculate for the following reasons:   Cannot find a previous HDL lab   Cannot find a previous total cholesterol lab    Plan for follow up: if echo normal, can follow up in 2 years. If abnormal, will need to discuss further workup prior to surgery  Jodelle Red, MD, PhD [facility]    Medication Adjustments/Labs and Tests Ordered: Current medicines are reviewed at length with the patient today.  Concerns regarding medicines are outlined above.  Orders Placed This Encounter  Procedures   EKG 12-Lead   ECHOCARDIOGRAM COMPLETE   No orders of the defined types were placed in this encounter.   Patient Instructions  Medication Instructions:  We will  wean down on metoprolol succinate.  -Take 25 mg (1 tablet) once a day for a week -then take 12.5 mg (1/2 tablet) once a day for a week -then stop  For now, keep benazepril at the same dose. If your blood pressures at home are consistently >140/90, call me and we will go up on the benazepril.  *If you need a refill on your cardiac medications before your next appointment, please call your pharmacy*   Lab Work: None  Testing/Procedures: Your physician has requested that you have an echocardiogram. Echocardiography is a painless test that uses sound waves to create images of your heart. It provides your doctor with information about the size and shape of your heart and how well your heart's chambers and valves are working. This procedure takes approximately one hour. There are no restrictions for this procedure. [address]    Follow-Up: At [facility], you and your health needs are our priority.  As part of our continuing mission to provide you with exceptional heart care, we have created designated Provider Care Teams.  These Care Teams include your primary Cardiologist (physician) and Advanced Practice Providers (APPs -  Physician Assistants and Nurse Practitioners) who all work together to provide you with the care you need, when you need it.  We recommend signing up for the patient portal called "MyChart".  Sign up information is provided on this After Visit Summary.  MyChart is used to connect with patients for Virtual Visits (Telemedicine).  Patients are able to view lab/test results, encounter notes, upcoming appointments, etc.  Non-urgent messages can be sent to your provider as well.   To learn more about what you can do with MyChart, go to [email or web address].    Your next appointment:   2 year(s)  The format for your next appointment:   In Person  Provider:   Jodelle Red, MD      Signed, Jodelle Red, MD PhD 12/22/2019    [facility]

## 2020-01-16 ENCOUNTER — Ambulatory Visit (HOSPITAL_COMMUNITY): Payer: Medicare Other | Attending: Cardiology

## 2020-01-16 ENCOUNTER — Other Ambulatory Visit: Payer: Self-pay

## 2020-01-16 DIAGNOSIS — Z01810 Encounter for preprocedural cardiovascular examination: Secondary | ICD-10-CM | POA: Diagnosis not present

## 2020-01-16 DIAGNOSIS — I447 Left bundle-branch block, unspecified: Secondary | ICD-10-CM | POA: Insufficient documentation

## 2020-01-16 LAB — ECHOCARDIOGRAM COMPLETE
P 1/2 time: 337 msec
S' Lateral: 3.2 cm

## 2020-01-23 ENCOUNTER — Telehealth: Payer: Self-pay | Admitting: Cardiology

## 2020-01-23 NOTE — Telephone Encounter (Signed)
Patient calling back to clarify her echo results.

## 2020-01-23 NOTE — Telephone Encounter (Signed)
ECHO results provided to pt as requested.

## 2020-01-24 ENCOUNTER — Telehealth: Payer: Self-pay

## 2020-01-24 NOTE — Telephone Encounter (Signed)
   [facility] Pre-operative Risk Assessment      ERROR

## 2020-01-30 NOTE — Telephone Encounter (Signed)
Not yet cleared. Abnormal echo. We are discussing in follow up 02/01/20

## 2020-02-01 ENCOUNTER — Encounter: Payer: Self-pay | Admitting: Cardiology

## 2020-02-01 ENCOUNTER — Other Ambulatory Visit: Payer: Self-pay

## 2020-02-01 ENCOUNTER — Ambulatory Visit (INDEPENDENT_AMBULATORY_CARE_PROVIDER_SITE_OTHER): Payer: Medicare Other | Admitting: Cardiology

## 2020-02-01 VITALS — BP 191/100 | HR 121 | Ht 64.5 in | Wt 122.0 lb

## 2020-02-01 DIAGNOSIS — Z712 Person consulting for explanation of examination or test findings: Secondary | ICD-10-CM | POA: Diagnosis not present

## 2020-02-01 DIAGNOSIS — I429 Cardiomyopathy, unspecified: Secondary | ICD-10-CM | POA: Diagnosis not present

## 2020-02-01 DIAGNOSIS — I447 Left bundle-branch block, unspecified: Secondary | ICD-10-CM

## 2020-02-01 DIAGNOSIS — Z01810 Encounter for preprocedural cardiovascular examination: Secondary | ICD-10-CM

## 2020-02-01 DIAGNOSIS — Z7189 Other specified counseling: Secondary | ICD-10-CM

## 2020-02-01 DIAGNOSIS — I10 Essential (primary) hypertension: Secondary | ICD-10-CM

## 2020-02-01 DIAGNOSIS — R931 Abnormal findings on diagnostic imaging of heart and coronary circulation: Secondary | ICD-10-CM

## 2020-02-01 MED ORDER — METOPROLOL SUCCINATE ER 25 MG PO TB24: 25.0000 mg | ORAL_TABLET | Freq: Every day | ORAL | 3 refills | Status: DC

## 2020-02-01 NOTE — Progress Notes (Signed)
Cardiology Office Note:    Date:  02/01/2020   ID:  Cynthia Bradshaw, DOB [birth year], MRN [id number]  PCP:  Kelton Pillar, MD  Cardiologist:  Buford Dresser, MD  Referring MD: Kelton Pillar, MD   CC: follow up abnormal echocardiogram  History of Present Illness:    Cynthia Bradshaw is a 75 y.o. female with a hx of hypertension, IBS who is seen for follow up. I initially met her 12/22/19 as a new consult at the request of Kelton Pillar, MD for the evaluation and management of preoperative cardiovascular evaluation, LBBB.   Echo performed showed reduced EF.  Today: Here to review echocardiogram results from 01/16/20 and discuss next steps. Echo (as below) notable for reduced EF 40-45%. Here with her husband today.  Brings a log of BP and HR today. Logs at home are well controlled, both HR and blood pressure.  From 12/29-1/12: Range 125/77-159/59mHR 65-87. Most average 130s/70s, 70s.  Weaned off metoprolol at last visit, doesn't feel any different.   We discussed her echo findings at length, discussed ischemic vs. Nonischemic possible etiologies. Discussed potential role of LBBB. See plan below.   We discussed this in terms of preoperative evaluation. She is completely asymptomatic. Reviewed potential heart failure symptoms, she denies all.  Denies chest pain, shortness of breath at rest or with normal exertion. No PND, orthopnea, LE edema or unexpected weight gain. No syncope or palpitations.  Past Medical History:  Diagnosis Date  . Anxiety   . Headache   . High cholesterol   . Hypertension   . IBS (irritable bowel syndrome)     Past Surgical History:  Procedure Laterality Date  . ABDOMINAL SURGERY    . BREAST EXCISIONAL BIOPSY Left   . COLON SURGERY      Current Medications: Current Outpatient Medications on File Prior to Visit  Medication Sig  . atorvastatin (LIPITOR) 20 MG tablet Take 20 mg by mouth daily.  . benazepril (LOTENSIN) 10 MG  tablet Take 10 mg by mouth daily.  . cholecalciferol (VITAMIN D) 1000 units tablet Take 1,000 Units by mouth daily.   .Marland Kitchendenosumab (PROLIA) 60 MG/ML SOSY injection Inject 60 mg into the skin every 6 (six) months.  . dicyclomine (BENTYL) 20 MG tablet Take 20 mg by mouth daily.   . Multiple Vitamin (MULTIVITAMIN WITH MINERALS) TABS tablet Take 1 tablet by mouth daily.  . Omega-3 Fatty Acids (FISH OIL ADULT GUMMIES PO) Take by mouth.  .Marland Kitchenomeprazole (PRILOSEC) 20 MG capsule Take 20 mg by mouth daily.  . polyethylene glycol (MIRALAX / GLYCOLAX) 17 g packet Take 17 g by mouth 2 (two) times a week.  .Marland KitchenPOTASSIUM PO Take 50 mg by mouth as needed.  . Probiotic Product (DIGESTIVE ADVANTAGE) CAPS Take 1 capsule by mouth daily.  . rizatriptan (MAXALT) 10 MG tablet Take 10 mg by mouth as needed for migraine. May repeat in 2 hours if needed  . vitamin E 180 MG (400 UNITS) capsule Take 400 Units by mouth daily.   No current facility-administered medications on file prior to visit.     Allergies:   Patient has no known allergies.   Social History   Tobacco Use  . Smoking status: Never Smoker  . Smokeless tobacco: Never Used  Substance Use Topics  . Alcohol use: No  . Drug use: No    Family History: family history is negative for Breast cancer.  ROS:   Please see the history of present illness.  Additional  pertinent ROS otherwise unremarkable.  EKGs/Labs/Other Studies Reviewed:    The following studies were reviewed today: Echo 01/16/20 1. Left ventricular ejection fraction, by estimation, is 40 to 45%. The  left ventricle has mildly decreased function. The left ventricle has no  regional wall motion abnormalities. Left ventricular diastolic parameters  are consistent with Grade I  diastolic dysfunction (impaired relaxation).  2. Right ventricular systolic function is normal. The right ventricular  size is normal. There is normal pulmonary artery systolic pressure.  3. The mitral valve is  normal in structure. No evidence of mitral valve  regurgitation. No evidence of mitral stenosis.  4. The aortic valve is normal in structure. Aortic valve regurgitation is  mild. Mild to moderate aortic valve sclerosis/calcification is present,  without any evidence of aortic stenosis. Aortic regurgitation PHT measures  337 msec.  5. The inferior vena cava is normal in size with greater than 50%  respiratory variability, suggesting right atrial pressure of 3 mmHg.   EKG:  EKG is personally reviewed.  The ekg ordered 12/22/19 demonstrates NSR, LBBB with QRS 140 msec  Recent Labs: 12/04/2019: BUN <5; Creatinine, Ser 0.71; Hemoglobin 13.3; Platelets 380; Potassium 4.0; Sodium 136  Recent Lipid Panel No results found for: CHOL, TRIG, HDL, CHOLHDL, VLDL, LDLCALC, LDLDIRECT  Physical Exam:    VS:  BP (!) 191/100   Pulse (!) 121   Ht 5' 4.5" (1.638 m)   Wt 122 lb (55.3 kg)   SpO2 99%   BMI 20.62 kg/m     Wt Readings from Last 3 Encounters:  02/01/20 122 lb (55.3 kg)  12/22/19 122 lb 6.4 oz (55.5 kg)  12/04/19 121 lb 1.6 oz (54.9 kg)    GEN: Well nourished, well developed in no acute distress HEENT: Normal, moist mucous membranes NECK: No JVD CARDIAC: regular rhythm, normal S1 and S2, no rubs or gallops. No murmur. VASCULAR: Radial and DP pulses 2+ bilaterally. No carotid bruits RESPIRATORY:  Clear to auscultation without rales, wheezing or rhonchi  ABDOMEN: Soft, non-tender, non-distended MUSCULOSKELETAL:  Ambulates independently SKIN: Warm and dry, no edema NEUROLOGIC:  Alert and oriented x 3. No focal neuro deficits noted. PSYCHIATRIC:  Normal affect   ASSESSMENT:    1. Cardiomyopathy, unspecified type ([place])   2. Abnormal echocardiogram   3. LBBB (left bundle branch block)   4. Preop cardiovascular exam   5. Encounter to discuss test results   6. Cardiac risk counseling   7. Counseling on health promotion and disease prevention    PLAN:    Abnormal  Echo LBBB Cardiomyopathy, undetermined etiology -We reviewed echo results at length. We discussed possible etiologies of reduced ejection fraction, specifically discussing the difference between ischemic and non-ischemic cardiomyopathy. We discussed how we typically work up a new cardiomyopathy, including right and left heart cath with coronary angiography. We also discussed recommended medical management of cardiomyopathy and clinical signs of heart failure. -given current Covid pandemic and limited procedures requiring hospital utilization, we discussed lexiscan and CT Cardiac as alternatives to cath. Discussed pros and cons of each, including but not limited to false positive/false negative risk, radiation risk, and risk of IV contrast dye. Based on shared decision making, decision was made to pursue CT coronary angiography. -we are restarting metoprolol succinate 25 mg daily. Her heart rates off metoprolol had been in the 70s, suspect restarting metoprolol will help with heart rate during CT. Ok to get additional metoprolol if needed -counseled on use of sublingual nitroglycerin and its importance to  a good test -continue benazepril for now. We did discuss ARBs and entresto today. Prefer to make one change at a time, starting with metoprolol today. -NYHA class I  Elevated blood pressure reading: component of white coat hypertension. Their cuff at home has been checked at the pharmacy, working properly -continue benazepril for now -restarting metoprolol succinate  Preoperative cardiovascular evaluation: Based on available date, patient's RCRI score = 0, which carries a 3.9% 30-day risk of death, MI, or cardiac arrest.  The patient is not currently having active cardiac symptoms, and they can achieve >4 METs of activity.  According to ACC/AHA Guidelines, no further testing is needed.  Proceed with surgery at acceptable risk.  She has never had clinical heart failure symptoms, and she denies any  ischemic symptoms. Her CT will be helpful to determine etiology of her cardiomyopathy but is not being performed for active ischemia. Her surgery can be scheduled in the interim.  Cardiac risk counseling and prevention recommendations: -recommend heart healthy/Mediterranean diet, with whole grains, fruits, vegetable, fish, lean meats, nuts, and olive oil. Limit salt. -recommend moderate walking, 3-5 times/week for 30-50 minutes each session. Aim for at least 150 minutes.week. Goal should be pace of 3 miles/hours, or walking 1.5 miles in 30 minutes -recommend avoidance of tobacco products. Avoid excess alcohol. -ASCVD risk score: The ASCVD Risk score Mikey Bussing DC Jr., et al., 2013) failed to calculate for the following reasons:   Cannot find a previous HDL lab   Cannot find a previous total cholesterol lab    Plan for follow up: 3 mos  Buford Dresser, MD, PhD, [facility]    Medication Adjustments/Labs and Tests Ordered: Current medicines are reviewed at length with the patient today.  Concerns regarding medicines are outlined above.  Orders Placed This Encounter  Procedures  . CT CORONARY MORPH W/CTA COR W/SCORE W/CA W/CM &/OR WO/CM  . CT CORONARY FRACTIONAL FLOW RESERVE DATA PREP  . CT CORONARY FRACTIONAL FLOW RESERVE FLUID ANALYSIS   Meds ordered this encounter  Medications  . metoprolol succinate (TOPROL XL) 25 MG 24 hr tablet    Sig: Take 1 tablet (25 mg total) by mouth daily.    Dispense:  90 tablet    Refill:  3    Patient Instructions   Testing/Procedue:  Your cardiac CT will be scheduled at one of the below locations:   [facility] [address] [place], [place] [address] [phone]   If scheduled at [facility], please arrive at the [facility] main entrance of [facility] 30 minutes prior to test start time. Proceed to the [facility] Radiology Department (first floor) to check-in and test  prep.    Please follow these instructions carefully (unless otherwise directed):    On the Night Before the Test: . Be sure to Drink plenty of water. . Do not consume any caffeinated/decaffeinated beverages or chocolate 12 hours prior to your test. . Do not take any antihistamines 12 hours prior to your test.   On the Day of the Test: . Drink plenty of water. Do not drink any water within one hour of the test. . Do not eat any food 4 hours prior to the test. . You may take your regular medications prior to the test.  . TAKE NORMAL METOPROLOL DOSE MORNING OF PROCEDURE . HOLD Furosemide/Hydrochlorothiazide morning of the test. . FEMALES- please wear underwire-free bra if available      After the Test: .  Drink plenty of water. . After receiving IV contrast, you may experience a mild flushed feeling. This is normal. . On occasion, you may experience a mild rash up to 24 hours after the test. This is not dangerous. If this occurs, you can take Benadryl 25 mg and increase your fluid intake. . If you experience trouble breathing, this can be serious. If it is severe call 911 IMMEDIATELY. If it is mild, please call our office. . If you take any of these medications: Glipizide/Metformin, Avandament, Glucavance, please do not take 48 hours after completing test unless otherwise instructed.   Once we have confirmed authorization from your insurance company, we will call you to set up a date and time for your test. Based on how quickly your insurance processes prior authorizations requests, please allow up to 4 weeks to be contacted for scheduling your Cardiac CT appointment. Be advised that routine Cardiac CT appointments could be scheduled as many as 8 weeks after your provider has ordered it.  For non-scheduling related questions, please contact the cardiac imaging nurse navigator should you have any questions/concerns: Marchia Bond, Cardiac Imaging Nurse Navigator Burley Saver, Interim Cardiac  Imaging Nurse [place] [facility] Direct Office Dial: [phone]   For scheduling needs, including cancellations and rescheduling, please call [place], [phone].     Follow-Up: At [facility], you and your health needs are our priority.  As part of our continuing mission to provide you with exceptional heart care, we have created designated Provider Care Teams.  These Care Teams include your primary Cardiologist (physician) and Advanced Practice Providers (APPs -  Physician Assistants and Nurse Practitioners) who all work together to provide you with the care you need, when you need it.  We recommend signing up for the patient portal called "MyChart".  Sign up information is provided on this After Visit Summary.  MyChart is used to connect with patients for Virtual Visits (Telemedicine).  Patients are able to view lab/test results, encounter notes, upcoming appointments, etc.  Non-urgent messages can be sent to your provider as well.   To learn more about what you can do with MyChart, go to [email or web address].    Your next appointment:   3 month(s)  The format for your next appointment:   In Person  Provider:   Buford Dresser, MD      Signed, Buford Dresser, MD PhD 02/01/2020  [place]

## 2020-02-01 NOTE — Patient Instructions (Signed)
Testing/Procedue:  Your cardiac CT will be scheduled at one of the below locations:   [facility] [address] [place], [place] [address] [phone]   If scheduled at [facility], please arrive at the [facility] main entrance of [facility] 30 minutes prior to test start time. Proceed to the [facility] Radiology Department (first floor) to check-in and test prep.    Please follow these instructions carefully (unless otherwise directed):    On the Night Before the Test: . Be sure to Drink plenty of water. . Do not consume any caffeinated/decaffeinated beverages or chocolate 12 hours prior to your test. . Do not take any antihistamines 12 hours prior to your test.   On the Day of the Test: . Drink plenty of water. Do not drink any water within one hour of the test. . Do not eat any food 4 hours prior to the test. . You may take your regular medications prior to the test.  . TAKE NORMAL METOPROLOL DOSE MORNING OF PROCEDURE . HOLD Furosemide/Hydrochlorothiazide morning of the test. . FEMALES- please wear underwire-free bra if available      After the Test: . Drink plenty of water. . After receiving IV contrast, you may experience a mild flushed feeling. This is normal. . On occasion, you may experience a mild rash up to 24 hours after the test. This is not dangerous. If this occurs, you can take Benadryl 25 mg and increase your fluid intake. . If you experience trouble breathing, this can be serious. If it is severe call 911 IMMEDIATELY. If it is mild, please call our office. . If you take any of these medications: Glipizide/Metformin, Avandament, Glucavance, please do not take 48 hours after completing test unless otherwise instructed.   Once we have confirmed authorization from your insurance company, we will call you to set up a date and time for your test. Based on how quickly your insurance processes prior authorizations requests, please  allow up to 4 weeks to be contacted for scheduling your Cardiac CT appointment. Be advised that routine Cardiac CT appointments could be scheduled as many as 8 weeks after your provider has ordered it.  For non-scheduling related questions, please contact the cardiac imaging nurse navigator should you have any questions/concerns: Marchia Bond, Cardiac Imaging Nurse Navigator Burley Saver, Interim Cardiac Imaging Nurse [place] [facility] Direct Office Dial: [phone]   For scheduling needs, including cancellations and rescheduling, please call [place], [phone].     Follow-Up: At [facility], you and your health needs are our priority.  As part of our continuing mission to provide you with exceptional heart care, we have created designated Provider Care Teams.  These Care Teams include your primary Cardiologist (physician) and Advanced Practice Providers (APPs -  Physician Assistants and Nurse Practitioners) who all work together to provide you with the care you need, when you need it.  We recommend signing up for the patient portal called "MyChart".  Sign up information is provided on this After Visit Summary.  MyChart is used to connect with patients for Virtual Visits (Telemedicine).  Patients are able to view lab/test results, encounter notes, upcoming appointments, etc.  Non-urgent messages can be sent to your provider as well.   To learn more about what you can do with MyChart, go to [email or web address].    Your next appointment:   3 month(s)  The format for your next appointment:   In Person  Provider:  Buford Dresser, MD

## 2020-02-04 ENCOUNTER — Encounter: Payer: Self-pay | Admitting: Cardiology

## 2020-02-08 ENCOUNTER — Telehealth (HOSPITAL_COMMUNITY): Payer: Self-pay | Admitting: *Deleted

## 2020-02-08 ENCOUNTER — Telehealth: Payer: Self-pay | Admitting: Cardiology

## 2020-02-08 DIAGNOSIS — Z01812 Encounter for preprocedural laboratory examination: Secondary | ICD-10-CM

## 2020-02-08 NOTE — Telephone Encounter (Signed)
New Message:     Pt says she is scheduled for a CT tomorrow at [facility]. Her problem is that her blood pressure always spikes when she is in a Medical Environment. She just received a call from the hospital that if her blood pressure is high tomorrow, they will not do the test. She wants to know what she needs to do please.

## 2020-02-08 NOTE — Telephone Encounter (Signed)
Reaching out to patient to offer assistance regarding upcoming cardiac imaging study; pt verbalizes understanding of appt date/time, parking situation and where to check in, pre-test NPO status and medications ordered, and verified current allergies; name and call back number provided for further questions should they arise  Merlene Dante RN Navigator Cardiac Imaging [facility] [facility] [phone] office [phone] cell  

## 2020-02-08 NOTE — Telephone Encounter (Signed)
Spoke with patient and she was upset after speaking with RN from hospital/CT dept  who called to go over her CT instructions for tomorrow. She was told if blood pressure was elevated at time of CT it would have to be rescheduled. . 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 She did want to cancel CT for tomorrow until Dr Cristal Deer was made aware of this and gave her recommendations on what to do regarding blood pressure. Wanted to confirm it would have to be rescheduled if blood pressure too high with Dr Cristal Deer as well.  Also patient was told that she needed labs and will come to [facility] office prior to having CT  Cancelled CT and will route to Dr Cristal Deer for review.

## 2020-02-09 ENCOUNTER — Telehealth (INDEPENDENT_AMBULATORY_CARE_PROVIDER_SITE_OTHER): Payer: Medicare Other | Admitting: Cardiology

## 2020-02-09 ENCOUNTER — Ambulatory Visit (HOSPITAL_COMMUNITY)
Admission: RE | Admit: 2020-02-09 | Discharge: 2020-02-09 | Disposition: A | Discharge status: Home / Self Care | Payer: Medicare Other | Source: Ambulatory Visit | Attending: Cardiology | Admitting: Cardiology

## 2020-02-09 ENCOUNTER — Encounter: Payer: Self-pay | Admitting: Cardiology

## 2020-02-09 VITALS — BP 138/81 | HR 70 | Ht 64.0 in | Wt 120.0 lb

## 2020-02-09 DIAGNOSIS — I429 Cardiomyopathy, unspecified: Secondary | ICD-10-CM

## 2020-02-09 DIAGNOSIS — R931 Abnormal findings on diagnostic imaging of heart and coronary circulation: Secondary | ICD-10-CM | POA: Diagnosis not present

## 2020-02-09 DIAGNOSIS — I447 Left bundle-branch block, unspecified: Secondary | ICD-10-CM

## 2020-02-09 NOTE — Telephone Encounter (Signed)
Returned call to pt and provided MD's recommendations. Pt and husband voiced frustration and feels they are now "getting the run around" regarding test. Pt state her BP is always elevated whenever she has a procedure and don't want to waste her time if it will get cancelled. Nurse informed pt and husband again of MD's recommendation but husband felt like the test should be cancelled because it's cause pt stress and doesn't feel it should be required prior to surgical clearance.  Nurse advised pt and husband that the best option at this point would be to discuss concerns with MD personally. Pt agreeable with plan and virtual appointment scheduled for today 1/21 at 4 pm.

## 2020-02-09 NOTE — Patient Instructions (Signed)
Medication Instructions:  Your Physician recommend you continue on your current medication as directed.    *If you need a refill on your cardiac medications before your next appointment, please call your pharmacy*   Lab Work: Your physician recommends that you return for lab work in 1 week prior to test (BMP).  If you have labs (blood work) drawn today and your tests are completely normal, you will receive your results only by: Marland Kitchen MyChart Message (if you have MyChart) OR . A paper copy in the mail If you have any lab test that is abnormal or we need to change your treatment, we will call you to review the results.   Testing/Procedures: Will we have someone call you to reschedule Cardiac CTA   Follow-Up: At [facility], you and your health needs are our priority.  As part of our continuing mission to provide you with exceptional heart care, we have created designated Provider Care Teams.  These Care Teams include your primary Cardiologist (physician) and Advanced Practice Providers (APPs -  Physician Assistants and Nurse Practitioners) who all work together to provide you with the care you need, when you need it.  We recommend signing up for the patient portal called "MyChart".  Sign up information is provided on this After Visit Summary.  MyChart is used to connect with patients for Virtual Visits (Telemedicine).  Patients are able to view lab/test results, encounter notes, upcoming appointments, etc.  Non-urgent messages can be sent to your provider as well.   To learn more about what you can do with MyChart, go to [email or web address].    Your next appointment:   05/13/20 @ 2 pm  The format for your next appointment:   In Person  Provider:   Jodelle Red, MD

## 2020-02-09 NOTE — Progress Notes (Signed)
Virtual Visit via Video Note   This visit type was conducted due to national recommendations for restrictions regarding the COVID-19 Pandemic (e.g. social distancing) in an effort to limit this patient's exposure and mitigate transmission in our community.  Due to her co-morbid illnesses, this patient is at least at moderate risk for complications without adequate follow up.  This format is felt to be most appropriate for this patient at this time.  All issues noted in this document were discussed and addressed.  A limited physical exam was performed with this format.  Please refer to the patient's chart for her consent to telehealth for [facility].  The patient was identified using 2 identifiers.  Patient Location: Home Provider Location: Office/Clinic  Date:  02/09/2020   ID:  Cynthia Bradshaw, DOB August 07, 1945, MRN [id number]  PCP:  Kelton Pillar, MD  Cardiologist:  Buford Dresser, MD  Referring MD: Kelton Pillar, MD   CC: follow up  History of Present Illness:    Cynthia Bradshaw is a 75 y.o. female with a hx of hypertension, IBS who is seen for follow up. I initially met her 12/22/19 as a new consult at the request of Kelton Pillar, MD for the evaluation and management of preoperative cardiovascular evaluation, LBBB.   Echo performed showed reduced EF.  Today: We discussed that Cardiac CT is to determine whether ischemic heart disease is the cause of cardiomyopathy. She has been asymptomatic, so CT is not needed for ischemia evaluation for symptoms/pre-op. Discussed that it will help [place] to make sure that we are fully treating her heart.   We reviewed the prior issue with the attempt at CT scan. They were very frustrated by the experience. We discussed that elevated heart rate is usually the reason for rescheduling the scan, not as commonly due to blood pressure. We reviewed the protocol for the test at length.  Denies chest pain, shortness of breath at  rest or with normal exertion. No PND, orthopnea, LE edema or unexpected weight gain. No syncope or palpitations.  Past Medical History:  Diagnosis Date  . Anxiety   . Headache   . High cholesterol   . Hypertension   . IBS (irritable bowel syndrome)     Past Surgical History:  Procedure Laterality Date  . ABDOMINAL SURGERY    . BREAST EXCISIONAL BIOPSY Left   . COLON SURGERY      Current Medications: Current Outpatient Medications on File Prior to Visit  Medication Sig  . atorvastatin (LIPITOR) 20 MG tablet Take 20 mg by mouth daily.  . benazepril (LOTENSIN) 10 MG tablet Take 10 mg by mouth daily.  . cholecalciferol (VITAMIN D) 1000 units tablet Take 1,000 Units by mouth daily.   Marland Kitchen denosumab (PROLIA) 60 MG/ML SOSY injection Inject 60 mg into the skin every 6 (six) months.  . dicyclomine (BENTYL) 20 MG tablet Take 20 mg by mouth daily.  . metoprolol succinate (TOPROL XL) 25 MG 24 hr tablet Take 1 tablet (25 mg total) by mouth daily.  . Multiple Vitamin (MULTIVITAMIN WITH MINERALS) TABS tablet Take 1 tablet by mouth daily.  . Omega-3 Fatty Acids (FISH OIL ADULT GUMMIES PO) Take by mouth.  Marland Kitchen omeprazole (PRILOSEC) 20 MG capsule Take 20 mg by mouth daily.  . polyethylene glycol (MIRALAX / GLYCOLAX) 17 g packet Take 17 g by mouth 2 (two) times a week.  Marland Kitchen POTASSIUM PO Take 50 mg by mouth as needed.  . Probiotic Product (DIGESTIVE ADVANTAGE) CAPS Take 1  capsule by mouth daily.  . rizatriptan (MAXALT) 10 MG tablet Take 10 mg by mouth as needed for migraine. May repeat in 2 hours if needed  . vitamin E 180 MG (400 UNITS) capsule Take 400 Units by mouth daily.   No current facility-administered medications on file prior to visit.     Allergies:   Patient has no known allergies.   Social History   Tobacco Use  . Smoking status: Never Smoker  . Smokeless tobacco: Never Used  Substance Use Topics  . Alcohol use: No  . Drug use: No    Family History: family history is negative for  Breast cancer.  ROS:   Please see the history of present illness.  Additional pertinent ROS otherwise unremarkable.  EKGs/Labs/Other Studies Reviewed:    The following studies were reviewed today: Echo 01/16/20 1. Left ventricular ejection fraction, by estimation, is 40 to 45%. The  left ventricle has mildly decreased function. The left ventricle has no  regional wall motion abnormalities. Left ventricular diastolic parameters  are consistent with Grade I  diastolic dysfunction (impaired relaxation).  2. Right ventricular systolic function is normal. The right ventricular  size is normal. There is normal pulmonary artery systolic pressure.  3. The mitral valve is normal in structure. No evidence of mitral valve  regurgitation. No evidence of mitral stenosis.  4. The aortic valve is normal in structure. Aortic valve regurgitation is  mild. Mild to moderate aortic valve sclerosis/calcification is present,  without any evidence of aortic stenosis. Aortic regurgitation PHT measures  337 msec.  5. The inferior vena cava is normal in size with greater than 50%  respiratory variability, suggesting right atrial pressure of 3 mmHg.   EKG:  EKG is personally reviewed.  The ekg ordered 12/22/19 demonstrates NSR, LBBB with QRS 140 msec  Recent Labs: 12/04/2019: BUN <5; Creatinine, Ser 0.71; Hemoglobin 13.3; Platelets 380; Potassium 4.0; Sodium 136  Recent Lipid Panel No results found for: CHOL, TRIG, HDL, CHOLHDL, VLDL, LDLCALC, LDLDIRECT  Physical Exam:    VS:  BP 138/81 (BP Location: Right Arm)   Pulse 70   Ht _0  (1.626 m)   Wt 120 lb (54.4 kg)   BMI 20.60 kg/m     Wt Readings from Last 3 Encounters:  02/09/20 120 lb (54.4 kg)  02/01/20 122 lb (55.3 kg)  12/22/19 122 lb 6.4 oz (55.5 kg)    Speaking comfortably on the phone, no audible wheezing In no acute distress Alert and oriented Normal affect Normal speech  ASSESSMENT:    1. Cardiomyopathy, unspecified type  ([place])   2. Abnormal echocardiogram   3. LBBB (left bundle branch block)    PLAN:    Abnormal Echo LBBB Cardiomyopathy, undetermined etiology -CT cardiac to determine if there is ischemic component to her cardiomyopathy. This is not preop and does not need to hold up her surgery. -continue metoprolol succinate, benazepril -We did discuss ARBs and entresto today. Will rediscuss after her surgery -NYHA class I  Cardiac risk counseling and prevention recommendations: -recommend heart healthy/Mediterranean diet, with whole grains, fruits, vegetable, fish, lean meats, nuts, and olive oil. Limit salt. -recommend moderate walking, 3-5 times/week for 30-50 minutes each session. Aim for at least 150 minutes.week. Goal should be pace of 3 miles/hours, or walking 1.5 miles in 30 minutes -recommend avoidance of tobacco products. Avoid excess alcohol. -ASCVD risk score: The ASCVD Risk score Mikey Bussing DC Jr., et al., 2013) failed to calculate for the following reasons:   Cannot  find a previous HDL lab   Cannot find a previous total cholesterol lab    Plan for follow up: 3 mos  Today, I have spent 12 minutes with the patient with telehealth technology discussing the above problems.  Additional time spent in chart review, documentation, and communication.  Buford Dresser, MD, PhD, [facility]    Medication Adjustments/Labs and Tests Ordered: Current medicines are reviewed at length with the patient today.  Concerns regarding medicines are outlined above.  No orders of the defined types were placed in this encounter.  No orders of the defined types were placed in this encounter.   Patient Instructions  Medication Instructions:  Your Physician recommend you continue on your current medication as directed.    *If you need a refill on your cardiac medications before your next appointment, please call your pharmacy*   Lab Work: Your physician recommends that you return  for lab work in 1 week prior to test (BMP).  If you have labs (blood work) drawn today and your tests are completely normal, you will receive your results only by: Marland Kitchen MyChart Message (if you have MyChart) OR . A paper copy in the mail If you have any lab test that is abnormal or we need to change your treatment, we will call you to review the results.   Testing/Procedures: Will we have someone call you to reschedule Cardiac CTA   Follow-Up: At [facility], you and your health needs are our priority.  As part of our continuing mission to provide you with exceptional heart care, we have created designated Provider Care Teams.  These Care Teams include your primary Cardiologist (physician) and Advanced Practice Providers (APPs -  Physician Assistants and Nurse Practitioners) who all work together to provide you with the care you need, when you need it.  We recommend signing up for the patient portal called "MyChart".  Sign up information is provided on this After Visit Summary.  MyChart is used to connect with patients for Virtual Visits (Telemedicine).  Patients are able to view lab/test results, encounter notes, upcoming appointments, etc.  Non-urgent messages can be sent to your provider as well.   To learn more about what you can do with MyChart, go to [email or web address].    Your next appointment:   05/13/20 @ 2 pm  The format for your next appointment:   In Person  Provider:   Buford Dresser, MD       Signed, Buford Dresser, MD PhD 02/09/2020  [place]

## 2020-02-09 NOTE — Telephone Encounter (Signed)
It is ok for mildly elevated blood pressure. This typically goes down when we given the nitroglycerin. It is more problematic if the heart rate is elevated, which the metoprolol should help with, and we can give additional metoprolol IV at the time of the test if we need to. Sorry to hear that was such a stressful phone call. I would get labs done as instructed and reschedule the CT. If she is worried about her blood pressure/heart rate the day of the test, she can take double the dose of her daily metoprolol only on the day of the CT scan.

## 2020-02-12 ENCOUNTER — Other Ambulatory Visit: Payer: Self-pay | Admitting: Neurosurgery

## 2020-02-26 ENCOUNTER — Other Ambulatory Visit: Payer: Self-pay

## 2020-02-26 ENCOUNTER — Encounter (HOSPITAL_COMMUNITY): Payer: Self-pay | Admitting: *Deleted

## 2020-02-26 ENCOUNTER — Other Ambulatory Visit (HOSPITAL_COMMUNITY)
Admission: RE | Admit: 2020-02-26 | Discharge: 2020-02-26 | Disposition: A | Discharge status: Home / Self Care | Payer: Medicare Other | Source: Ambulatory Visit | Attending: Neurosurgery | Admitting: Neurosurgery

## 2020-02-26 DIAGNOSIS — Z01812 Encounter for preprocedural laboratory examination: Secondary | ICD-10-CM | POA: Insufficient documentation

## 2020-02-26 DIAGNOSIS — Z20822 Contact with and (suspected) exposure to covid-19: Secondary | ICD-10-CM | POA: Insufficient documentation

## 2020-02-26 LAB — SARS CORONAVIRUS 2 (TAT 6-24 HRS): SARS Coronavirus 2: NEGATIVE

## 2020-02-26 NOTE — Progress Notes (Signed)
PCP - Dr [name] Cardiologist - Dr Jodelle Red  Chest x-ray - n/a EKG - 12/22/19 Stress Test - n/a ECHO - 01/16/20 Cardiac Cath - n/a  Anesthesia review: Yes  STOP now taking any Aspirin (unless otherwise instructed by your surgeon), Aleve, Naproxen, Ibuprofen, Motrin, Advil, Goody's, BC's, all herbal medications, fish oil, and all vitamins.   Coronavirus Screening Covid test is scheduled on 02/26/20 Do you have any of the following symptoms:  Cough yes/no: No Fever (>100.25F)  yes/no: No Runny nose yes/no: No Sore throat yes/no: No Difficulty breathing/shortness of breath  yes/no: No  Have you traveled in the last 14 days and where? yes/no: No  Patient verbalized understanding of instructions that were given via phone.

## 2020-02-27 NOTE — Anesthesia Preprocedure Evaluation (Addendum)
Anesthesia Evaluation  Patient identified by MRN, date of birth, ID band Patient awake    Reviewed: Allergy & Precautions, H&P , NPO status , Patient's Chart, lab work & pertinent test results  Airway Mallampati: II   Neck ROM: full    Dental   Pulmonary neg pulmonary ROS,    breath sounds clear to auscultation       Cardiovascular hypertension,  Rhythm:regular Rate:Normal  EF 40-45%   Neuro/Psych  Headaches, Anxiety    GI/Hepatic GERD  ,  Endo/Other    Renal/GU      Musculoskeletal  (+) Arthritis ,   Abdominal   Peds  Hematology   Anesthesia Other Findings   Reproductive/Obstetrics                            Anesthesia Physical Anesthesia Plan  ASA: II  Anesthesia Plan: General   Post-op Pain Management:    Induction: Intravenous  PONV Risk Score and Plan: 3 and Ondansetron, Dexamethasone and Treatment may vary due to age or medical condition  Airway Management Planned: Oral ETT  Additional Equipment: Arterial line  Intra-op Plan:   Post-operative Plan: Extubation in OR  Informed Consent: I have reviewed the patients History and Physical, chart, labs and discussed the procedure including the risks, benefits and alternatives for the proposed anesthesia with the patient or authorized representative who has indicated his/her understanding and acceptance.     Dental advisory given  Plan Discussed with: CRNA, Anesthesiologist and Surgeon  Anesthesia Plan Comments: (PAT note written 02/27/2020 by Shonna Chock, PA-C. Saw cardiologist Dr. Cristal Deer for preoperative evaluation given LBBB. Echo 40-45%. Per Dr. Cristal Deer:  "Preoperative cardiovascular evaluation: Based on available date, patient's RCRI score = 0, which carries a 3.9% 30-day risk of death, MI, or cardiac arrest.  The patient is not currently having active cardiac symptoms, and they can achieve >4 METs of  activity.  According to ACC/AHA Guidelines, no further testing is needed.  Proceed with surgery at acceptable risk.  She has never had clinical heart failure symptoms, and she denies any ischemic symptoms. Her CT will be helpful to determine etiology of her cardiomyopathy but is not being performed for active ischemia. Her surgery can be scheduled in the interim."   )       Anesthesia Quick Evaluation

## 2020-02-27 NOTE — Progress Notes (Signed)
Anesthesia Chart Review: SAME DAY WORK-UP   Case: [id number] Date/Time: 02/28/20 0815   Procedures:      LLIF L23, L34,L45 RIGHT, PERC POSTERIOR INSTRUMENTATION. MIS RT L45 LAMINECTOMY ANS FACETECTOMY, POSS L45 TLIF (N/A )     (MIS) Right L4-5 Laminectomy with Possible TRANSFORAMINAL LUMBAR INTERBODY FUSION (TLIF) 1 LEVEL (N/A )   Anesthesia type: General   Pre-op diagnosis: LUMBAR STENOSIS   [facility]: [facility] ROOM 21 / [facility]   Surgeons: Cynthia Person, MD      DISCUSSION: Patient is a 75 year old female scheduled for the above procedure. Surgery was initially scheduled for 12/07/19, but was postponed due to finding of age undetermined LBBB (see below).   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  In 11/2019, Cynthia Small, MD noted that patient with "h/o significant white coat hypertension and an exaggerated response to stress. Ok for surgery, but to make anesthesia aware of likely bp issues at time of surgery." Her home BP readings had been ~ 130's-150's/60's-80's then.  Since then, patient had cardiology evaluation with Dr. Cristal Bradshaw. Echo ordered and done on 01/16/20 showing LVEF 40-45%, possibly due to LBBB, but coronary CT had been ordered to help determine etiology. She had virtual follow-up visit with Dr. Cristal Bradshaw on 02/09/20 after becoming frustrated that her coronary CT could be cancelled if her BP was too high (issues with "white coat hypertension") and questioned if it had to be done prior to surgery given she was asymptomatic. (Home BP readings from 01/17/20-01/31/20 ranged from 125/77-159/79.) In regards to surgery, Dr. Cristal Bradshaw wrote,  "Preoperative cardiovascular evaluation: Based on available date, patient's RCRI score = 0, which carries a 3.9% 30-day risk of death, MI, or cardiac arrest.  The  patient is not currently having active cardiac symptoms, and they can achieve >4 METs of activity.  According to ACC/AHA Guidelines, no further testing is needed.  Proceed with surgery at acceptable risk.  She has never had clinical heart failure symptoms, and she denies any ischemic symptoms. Her CT will be helpful to determine etiology of her cardiomyopathy but is not being performed for active ischemia. Her surgery can be scheduled in the interim." CT rescheduled for after surgery (04/03/20), and 3 month follow-up planned. Meds include benazepril, atorvastatin, Toprol XL, fish oil.   02/26/20 presurgical COVID-19 test negative. Anesthesia team to evaluate on the day of surgery.    VS: Ht 5\' 4"  (1.626 m)   Wt 54.4 kg   LMP  (LMP Unknown)   BMI 20.60 kg/m   BP Readings from Last 3 Encounters:  02/09/20 138/81  02/01/20 (!) 191/100  12/22/19 (!) 180/110   Pulse Readings from Last 3 Encounters:  02/09/20 70  02/01/20 (!) 121  12/22/19 81    PROVIDERS: 14/03/21, MD is PCP  Cynthia Small, MD is cardiologist   LABS: For day of surgery. As of 12/04/19, CBC WNL, Cr 0.71, glucose 90.    IMAGES: CT L-spine 11/08/19: IMPRESSION: 1. No acute osseous abnormality and stable advanced lower lumbar degeneration from the April MRI. 2. 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 Query Right L4 and/or L5 radiculitis. 3. Moderate right L3 and moderate to severe left L5 foraminal stenosis.   EKG: 12/22/19: NSR. Possible left atrial enlargement. LBBB.   CV: Echo 01/16/20: IMPRESSIONS  1. Left ventricular ejection fraction, by estimation, is 40 to 45%. The  left ventricle has mildly decreased function. The left ventricle has no  regional wall motion abnormalities. Left ventricular diastolic parameters  are consistent with Grade I  diastolic dysfunction (impaired  relaxation).  2. Right ventricular systolic function is normal. The right ventricular  size is normal. There is normal pulmonary artery systolic pressure.  3. The mitral valve is normal in structure. No evidence of mitral valve  regurgitation. No evidence of mitral stenosis.  4. The aortic valve is normal in structure. Aortic valve regurgitation is  mild. Mild to moderate aortic valve sclerosis/calcification is present,  without any evidence of aortic stenosis. Aortic regurgitation PHT measures  337 msec.  5. The inferior vena cava is normal in size with greater than 50%  respiratory variability, suggesting right atrial pressure of 3 mmHg.    Past Medical History:  Diagnosis Date  . Anxiety   . Arthritis    lower back  . Cardiomyopathy (HCC)   . Constipation   . Dysrhythmia    LBBB  . GERD (gastroesophageal reflux disease)   . Headache   . High cholesterol   . Hypertension   . IBS (irritable bowel syndrome)   . Migraines   . Seasonal allergies     Past Surgical History:  Procedure Laterality Date  . ABDOMINAL SURGERY    . BREAST EXCISIONAL BIOPSY Left   . COLON SURGERY    . COLONOSCOPY    . COLOSTOMY    . COLOSTOMY REVERSAL      MEDICATIONS: No current facility-administered medications for this encounter.   Marland Kitchen atorvastatin (LIPITOR) 20 MG tablet  . benazepril (LOTENSIN) 10 MG tablet  . cholecalciferol (VITAMIN D) 1000 units tablet  . denosumab (PROLIA) 60 MG/ML SOSY injection  . dicyclomine (BENTYL) 20 MG tablet  . metoprolol succinate (TOPROL XL) 25 MG 24 hr tablet  . Multiple Vitamin (MULTIVITAMIN WITH MINERALS) TABS tablet  . Omega-3 Fatty Acids (FISH OIL ADULT GUMMIES PO)  . omeprazole (PRILOSEC) 20 MG capsule  . polyethylene glycol (MIRALAX / GLYCOLAX) 17 g packet  . POTASSIUM PO  . Probiotic Product (DIGESTIVE ADVANTAGE) CAPS  . rizatriptan (MAXALT) 10 MG tablet  . vitamin E 180 MG (400 UNITS) capsule    Cynthia Chock, PA-C Surgical Short  Stay/Anesthesiology [facility] Phone [phone] [facility] Phone [phone] 02/27/2020 12:29 PM

## 2020-02-28 ENCOUNTER — Inpatient Hospital Stay (HOSPITAL_COMMUNITY): Payer: Medicare Other | Admitting: Vascular Surgery

## 2020-02-28 ENCOUNTER — Other Ambulatory Visit: Payer: Self-pay

## 2020-02-28 ENCOUNTER — Encounter (HOSPITAL_COMMUNITY): Payer: Self-pay

## 2020-02-28 ENCOUNTER — Inpatient Hospital Stay (HOSPITAL_COMMUNITY): Payer: Medicare Other

## 2020-02-28 ENCOUNTER — Inpatient Hospital Stay (HOSPITAL_COMMUNITY)
Admission: RE | Admit: 2020-02-28 | Discharge: 2020-03-01 | DRG: 454 | Disposition: A | Discharge status: Home Health | Payer: Medicare Other | Attending: Neurosurgery | Admitting: Neurosurgery

## 2020-02-28 ENCOUNTER — Encounter (HOSPITAL_COMMUNITY)
Admission: RE | Disposition: A | Discharge status: Home Health | Payer: Self-pay | Source: Home / Self Care | Attending: Neurosurgery

## 2020-02-28 DIAGNOSIS — E871 Hypo-osmolality and hyponatremia: Secondary | ICD-10-CM | POA: Diagnosis not present

## 2020-02-28 DIAGNOSIS — E7800 Pure hypercholesterolemia, unspecified: Secondary | ICD-10-CM | POA: Diagnosis not present

## 2020-02-28 DIAGNOSIS — I429 Cardiomyopathy, unspecified: Secondary | ICD-10-CM | POA: Diagnosis present

## 2020-02-28 DIAGNOSIS — M48061 Spinal stenosis, lumbar region without neurogenic claudication: Principal | ICD-10-CM | POA: Diagnosis present

## 2020-02-28 DIAGNOSIS — M4324 Fusion of spine, thoracic region: Secondary | ICD-10-CM | POA: Diagnosis not present

## 2020-02-28 DIAGNOSIS — M2578 Osteophyte, vertebrae: Secondary | ICD-10-CM | POA: Diagnosis present

## 2020-02-28 DIAGNOSIS — M5116 Intervertebral disc disorders with radiculopathy, lumbar region: Secondary | ICD-10-CM | POA: Diagnosis present

## 2020-02-28 DIAGNOSIS — Z419 Encounter for procedure for purposes other than remedying health state, unspecified: Secondary | ICD-10-CM

## 2020-02-28 DIAGNOSIS — M4150 Other secondary scoliosis, site unspecified: Secondary | ICD-10-CM

## 2020-02-28 DIAGNOSIS — M8580 Other specified disorders of bone density and structure, unspecified site: Secondary | ICD-10-CM | POA: Diagnosis present

## 2020-02-28 DIAGNOSIS — K219 Gastro-esophageal reflux disease without esophagitis: Secondary | ICD-10-CM | POA: Diagnosis not present

## 2020-02-28 DIAGNOSIS — Z981 Arthrodesis status: Secondary | ICD-10-CM | POA: Diagnosis not present

## 2020-02-28 DIAGNOSIS — K589 Irritable bowel syndrome without diarrhea: Secondary | ICD-10-CM | POA: Diagnosis present

## 2020-02-28 DIAGNOSIS — M1990 Unspecified osteoarthritis, unspecified site: Secondary | ICD-10-CM | POA: Diagnosis present

## 2020-02-28 DIAGNOSIS — I447 Left bundle-branch block, unspecified: Secondary | ICD-10-CM | POA: Diagnosis not present

## 2020-02-28 DIAGNOSIS — G43909 Migraine, unspecified, not intractable, without status migrainosus: Secondary | ICD-10-CM | POA: Diagnosis present

## 2020-02-28 DIAGNOSIS — M4186 Other forms of scoliosis, lumbar region: Secondary | ICD-10-CM | POA: Diagnosis not present

## 2020-02-28 DIAGNOSIS — I10 Essential (primary) hypertension: Secondary | ICD-10-CM | POA: Diagnosis not present

## 2020-02-28 DIAGNOSIS — Z20822 Contact with and (suspected) exposure to covid-19: Secondary | ICD-10-CM | POA: Diagnosis not present

## 2020-02-28 DIAGNOSIS — M5416 Radiculopathy, lumbar region: Secondary | ICD-10-CM | POA: Diagnosis not present

## 2020-02-28 DIAGNOSIS — M4316 Spondylolisthesis, lumbar region: Secondary | ICD-10-CM | POA: Diagnosis not present

## 2020-02-28 DIAGNOSIS — M4184 Other forms of scoliosis, thoracic region: Secondary | ICD-10-CM | POA: Diagnosis not present

## 2020-02-28 DIAGNOSIS — M4180 Other forms of scoliosis, site unspecified: Secondary | ICD-10-CM

## 2020-02-28 DIAGNOSIS — M4326 Fusion of spine, lumbar region: Secondary | ICD-10-CM | POA: Diagnosis not present

## 2020-02-28 HISTORY — DX: Gastro-esophageal reflux disease without esophagitis: K21.9

## 2020-02-28 HISTORY — DX: Migraine, unspecified, not intractable, without status migrainosus: G43.909

## 2020-02-28 HISTORY — DX: Unspecified osteoarthritis, unspecified site: M19.90

## 2020-02-28 HISTORY — DX: Cardiac arrhythmia, unspecified: I49.9

## 2020-02-28 HISTORY — DX: Cardiomyopathy, unspecified: I42.9

## 2020-02-28 HISTORY — DX: Other seasonal allergic rhinitis: J30.2

## 2020-02-28 HISTORY — PX: LUMBAR PERCUTANEOUS PEDICLE SCREW 3 LEVEL: SHX5562

## 2020-02-28 HISTORY — PX: ANTERIOR LAT LUMBAR FUSION: SHX1168

## 2020-02-28 HISTORY — DX: Constipation, unspecified: K59.00

## 2020-02-28 LAB — CBC
HCT: 41.1 % (ref 36.0–46.0)
Hemoglobin: 13.2 g/dL (ref 12.0–15.0)
MCH: 30.8 pg (ref 26.0–34.0)
MCHC: 32.1 g/dL (ref 30.0–36.0)
MCV: 95.8 fL (ref 80.0–100.0)
Platelets: 386 10*3/uL (ref 150–400)
RBC: 4.29 MIL/uL (ref 3.87–5.11)
RDW: 12.2 % (ref 11.5–15.5)
WBC: 9.2 10*3/uL (ref 4.0–10.5)
nRBC: 0 % (ref 0.0–0.2)

## 2020-02-28 LAB — BASIC METABOLIC PANEL
Anion gap: 11 (ref 5–15)
BUN: 13 mg/dL (ref 8–23)
CO2: 22 mmol/L (ref 22–32)
Calcium: 9.6 mg/dL (ref 8.9–10.3)
Chloride: 102 mmol/L (ref 98–111)
Creatinine, Ser: 0.75 mg/dL (ref 0.44–1.00)
GFR, Estimated: >60 mL/min (ref >60)
Glucose, Bld: 110 mg/dL — ABNORMAL HIGH (ref 70–99)
Potassium: 4.8 mmol/L (ref 3.5–5.1)
Sodium: 135 mmol/L (ref 135–145)

## 2020-02-28 LAB — TYPE AND SCREEN
ABO/RH(D): A NEG
Antibody Screen: NEGATIVE

## 2020-02-28 SURGERY — ANTERIOR LATERAL LUMBAR FUSION 3 LEVELS
Anesthesia: General | Site: Flank

## 2020-02-28 MED ORDER — ENOXAPARIN SODIUM 40 MG/0.4ML ~~LOC~~ SOLN
40.0000 mg | SUBCUTANEOUS | Status: DC
  Administered 2020-02-29 – 2020-03-01 (×2): 40 mg via SUBCUTANEOUS
  Filled 2020-02-28 (×2): qty 0.4

## 2020-02-28 MED ORDER — BENAZEPRIL HCL 5 MG PO TABS
10.0000 mg | ORAL_TABLET | Freq: Every day | ORAL | Status: DC
  Administered 2020-02-28 – 2020-02-29 (×2): 10 mg via ORAL
  Filled 2020-02-28 (×3): qty 2

## 2020-02-28 MED ORDER — FENTANYL CITRATE (PF) 250 MCG/5ML IJ SOLN
INTRAMUSCULAR | Status: AC
  Filled 2020-02-28: qty 5

## 2020-02-28 MED ORDER — PHENYLEPHRINE 40 MCG/ML (10ML) SYRINGE FOR IV PUSH (FOR BLOOD PRESSURE SUPPORT)
PREFILLED_SYRINGE | INTRAVENOUS | Status: DC | PRN
  Administered 2020-02-28 (×2): 80 ug via INTRAVENOUS
  Administered 2020-02-28: 40 ug via INTRAVENOUS
  Administered 2020-02-28: 80 ug via INTRAVENOUS
  Administered 2020-02-28: 40 ug via INTRAVENOUS
  Administered 2020-02-28: 80 ug via INTRAVENOUS

## 2020-02-28 MED ORDER — ONDANSETRON HCL 4 MG/2ML IJ SOLN: 4.0000 mg | Freq: Four times a day (QID) | INTRAMUSCULAR | Status: DC | PRN

## 2020-02-28 MED ORDER — FENTANYL CITRATE (PF) 100 MCG/2ML IJ SOLN: 25.0000 ug | INTRAMUSCULAR | Status: DC | PRN

## 2020-02-28 MED ORDER — VITAMIN D 25 MCG (1000 UNIT) PO TABS
1000.0000 [IU] | ORAL_TABLET | Freq: Every day | ORAL | Status: DC
  Administered 2020-02-28 – 2020-02-29 (×2): 1000 [IU] via ORAL
  Filled 2020-02-28 (×2): qty 1

## 2020-02-28 MED ORDER — OXYCODONE HCL 5 MG/5ML PO SOLN: 5.0000 mg | Freq: Once | ORAL | Status: DC | PRN

## 2020-02-28 MED ORDER — CEFAZOLIN SODIUM-DEXTROSE 1-4 GM/50ML-% IV SOLN
1.0000 g | Freq: Three times a day (TID) | INTRAVENOUS | Status: AC
  Administered 2020-02-28 – 2020-02-29 (×3): 1 g via INTRAVENOUS
  Filled 2020-02-28 (×3): qty 50

## 2020-02-28 MED ORDER — POTASSIUM CHLORIDE IN NACL 20-0.9 MEQ/L-% IV SOLN: INTRAVENOUS | Status: DC

## 2020-02-28 MED ORDER — HYDROMORPHONE HCL 1 MG/ML IJ SOLN
INTRAMUSCULAR | Status: DC | PRN
  Administered 2020-02-28 (×2): .25 mg via INTRAVENOUS

## 2020-02-28 MED ORDER — OXYCODONE HCL 5 MG PO TABS: 5.0000 mg | ORAL_TABLET | Freq: Once | ORAL | Status: DC | PRN

## 2020-02-28 MED ORDER — LIDOCAINE-EPINEPHRINE 1 %-1:100000 IJ SOLN
INTRAMUSCULAR | Status: DC | PRN
  Administered 2020-02-28: 7 mL
  Administered 2020-02-28: 13 mL

## 2020-02-28 MED ORDER — ACETAMINOPHEN 10 MG/ML IV SOLN
INTRAVENOUS | Status: DC | PRN
  Administered 2020-02-28: 1000 mg via INTRAVENOUS

## 2020-02-28 MED ORDER — ROCURONIUM BROMIDE 10 MG/ML (PF) SYRINGE
PREFILLED_SYRINGE | INTRAVENOUS | Status: DC | PRN
  Administered 2020-02-28: 50 mg via INTRAVENOUS
  Administered 2020-02-28: 30 mg via INTRAVENOUS

## 2020-02-28 MED ORDER — HEMOSTATIC AGENTS (NO CHARGE) OPTIME
TOPICAL | Status: DC | PRN
  Administered 2020-02-28: 1 via TOPICAL

## 2020-02-28 MED ORDER — KETAMINE HCL 10 MG/ML IJ SOLN
INTRAMUSCULAR | Status: DC | PRN
  Administered 2020-02-28 (×4): 10 mg via INTRAVENOUS
  Administered 2020-02-28: 30 mg via INTRAVENOUS

## 2020-02-28 MED ORDER — SODIUM CHLORIDE 0.9% FLUSH: 3.0000 mL | INTRAVENOUS | Status: DC | PRN

## 2020-02-28 MED ORDER — MORPHINE SULFATE (PF) 2 MG/ML IV SOLN: 2.0000 mg | INTRAVENOUS | Status: DC | PRN

## 2020-02-28 MED ORDER — SUGAMMADEX SODIUM 200 MG/2ML IV SOLN
INTRAVENOUS | Status: DC | PRN
  Administered 2020-02-28: 200 mg via INTRAVENOUS

## 2020-02-28 MED ORDER — FLEET ENEMA 7-19 GM/118ML RE ENEM: 1.0000 | ENEMA | Freq: Once | RECTAL | Status: DC | PRN

## 2020-02-28 MED ORDER — CEFAZOLIN SODIUM-DEXTROSE 2-4 GM/100ML-% IV SOLN
2.0000 g | INTRAVENOUS | Status: AC
  Administered 2020-02-28 (×2): 2 g via INTRAVENOUS
  Filled 2020-02-28: qty 100

## 2020-02-28 MED ORDER — POTASSIUM 75 MG PO TABS: 50.0000 mg | ORAL_TABLET | Freq: Every day | ORAL | Status: DC | PRN

## 2020-02-28 MED ORDER — LIDOCAINE-EPINEPHRINE 1 %-1:100000 IJ SOLN
INTRAMUSCULAR | Status: AC
  Filled 2020-02-28: qty 1

## 2020-02-28 MED ORDER — ADULT MULTIVITAMIN W/MINERALS CH
1.0000 | ORAL_TABLET | Freq: Every day | ORAL | Status: DC
  Administered 2020-02-28 – 2020-02-29 (×2): 1 via ORAL
  Filled 2020-02-28 (×2): qty 1

## 2020-02-28 MED ORDER — PANTOPRAZOLE SODIUM 40 MG PO TBEC
40.0000 mg | DELAYED_RELEASE_TABLET | Freq: Every day | ORAL | Status: DC
  Administered 2020-02-29: 40 mg via ORAL
  Filled 2020-02-28: qty 1

## 2020-02-28 MED ORDER — METOPROLOL SUCCINATE ER 25 MG PO TB24
25.0000 mg | ORAL_TABLET | Freq: Every day | ORAL | Status: DC
  Administered 2020-02-29: 25 mg via ORAL
  Filled 2020-02-28: qty 1

## 2020-02-28 MED ORDER — ONDANSETRON HCL 4 MG PO TABS: 4.0000 mg | ORAL_TABLET | Freq: Four times a day (QID) | ORAL | Status: DC | PRN

## 2020-02-28 MED ORDER — ACETAMINOPHEN 325 MG PO TABS: 650.0000 mg | ORAL_TABLET | ORAL | Status: DC | PRN

## 2020-02-28 MED ORDER — CHLORHEXIDINE GLUCONATE CLOTH 2 % EX PADS: 6.0000 | MEDICATED_PAD | Freq: Once | CUTANEOUS | Status: DC

## 2020-02-28 MED ORDER — SODIUM CHLORIDE 0.9% FLUSH
3.0000 mL | Freq: Two times a day (BID) | INTRAVENOUS | Status: DC
  Administered 2020-02-28 – 2020-02-29 (×3): 3 mL via INTRAVENOUS

## 2020-02-28 MED ORDER — PROPOFOL 10 MG/ML IV BOLUS
INTRAVENOUS | Status: AC
  Filled 2020-02-28: qty 20

## 2020-02-28 MED ORDER — EPHEDRINE SULFATE-NACL 50-0.9 MG/10ML-% IV SOSY
PREFILLED_SYRINGE | INTRAVENOUS | Status: DC | PRN
  Administered 2020-02-28 (×2): 5 mg via INTRAVENOUS

## 2020-02-28 MED ORDER — THROMBIN 5000 UNITS EX SOLR: OROMUCOSAL | Status: DC | PRN

## 2020-02-28 MED ORDER — ACETAMINOPHEN 10 MG/ML IV SOLN
INTRAVENOUS | Status: AC
  Filled 2020-02-28: qty 100

## 2020-02-28 MED ORDER — 0.9 % SODIUM CHLORIDE (POUR BTL) OPTIME
TOPICAL | Status: DC | PRN
  Administered 2020-02-28: 1000 mL

## 2020-02-28 MED ORDER — OXYCODONE-ACETAMINOPHEN 5-325 MG PO TABS
1.0000 | ORAL_TABLET | Freq: Four times a day (QID) | ORAL | Status: DC | PRN
  Administered 2020-02-28: 2 via ORAL
  Filled 2020-02-28: qty 2

## 2020-02-28 MED ORDER — METHOCARBAMOL 500 MG PO TABS
500.0000 mg | ORAL_TABLET | Freq: Four times a day (QID) | ORAL | Status: DC | PRN
  Administered 2020-02-28 – 2020-03-01 (×5): 500 mg via ORAL
  Filled 2020-02-28 (×5): qty 1

## 2020-02-28 MED ORDER — PROPOFOL 10 MG/ML IV BOLUS
INTRAVENOUS | Status: DC | PRN
  Administered 2020-02-28: 50 mg via INTRAVENOUS
  Administered 2020-02-28: 150 mg via INTRAVENOUS

## 2020-02-28 MED ORDER — SUFENTANIL CITRATE 250 MCG/5ML IV SOLN
0.2500 ug/kg/h | INTRAVENOUS | Status: AC
  Administered 2020-02-28: .25 ug/kg/h via INTRAVENOUS
  Filled 2020-02-28 (×4): qty 5

## 2020-02-28 MED ORDER — ATORVASTATIN CALCIUM 10 MG PO TABS
20.0000 mg | ORAL_TABLET | Freq: Every day | ORAL | Status: DC
  Administered 2020-02-29: 20 mg via ORAL
  Filled 2020-02-28: qty 2

## 2020-02-28 MED ORDER — THROMBIN 5000 UNITS EX SOLR
CUTANEOUS | Status: DC | PRN
  Administered 2020-02-28 (×2): 5000 [IU] via TOPICAL

## 2020-02-28 MED ORDER — BUPIVACAINE HCL (PF) 0.5 % IJ SOLN
INTRAMUSCULAR | Status: AC
  Filled 2020-02-28: qty 30

## 2020-02-28 MED ORDER — DEXMEDETOMIDINE (PRECEDEX) IN NS 20 MCG/5ML (4 MCG/ML) IV SYRINGE
PREFILLED_SYRINGE | INTRAVENOUS | Status: DC | PRN
  Administered 2020-02-28: 8 ug via INTRAVENOUS
  Administered 2020-02-28: 12 ug via INTRAVENOUS

## 2020-02-28 MED ORDER — SUCCINYLCHOLINE CHLORIDE 200 MG/10ML IV SOSY
PREFILLED_SYRINGE | INTRAVENOUS | Status: DC | PRN
  Administered 2020-02-28: 120 mg via INTRAVENOUS

## 2020-02-28 MED ORDER — CHLORHEXIDINE GLUCONATE 0.12 % MT SOLN
15.0000 mL | Freq: Once | OROMUCOSAL | Status: AC
  Administered 2020-02-28: 15 mL via OROMUCOSAL
  Filled 2020-02-28: qty 15

## 2020-02-28 MED ORDER — ACETAMINOPHEN 650 MG RE SUPP: 650.0000 mg | RECTAL | Status: DC | PRN

## 2020-02-28 MED ORDER — BUPIVACAINE LIPOSOME 1.3 % IJ SUSP
20.0000 mL | INTRAMUSCULAR | Status: DC
  Filled 2020-02-28: qty 20

## 2020-02-28 MED ORDER — PHENYLEPHRINE HCL-NACL 10-0.9 MG/250ML-% IV SOLN
INTRAVENOUS | Status: DC | PRN
  Administered 2020-02-28: 30 ug/min via INTRAVENOUS

## 2020-02-28 MED ORDER — OXYCODONE-ACETAMINOPHEN 5-325 MG PO TABS
1.0000 | ORAL_TABLET | ORAL | Status: DC | PRN
  Administered 2020-02-29 – 2020-03-01 (×8): 2 via ORAL
  Filled 2020-02-28 (×8): qty 2

## 2020-02-28 MED ORDER — LACTATED RINGERS IV SOLN: INTRAVENOUS | Status: DC

## 2020-02-28 MED ORDER — DICYCLOMINE HCL 20 MG PO TABS
20.0000 mg | ORAL_TABLET | Freq: Every day | ORAL | Status: DC
  Administered 2020-02-28 – 2020-02-29 (×2): 20 mg via ORAL
  Filled 2020-02-28 (×3): qty 1

## 2020-02-28 MED ORDER — MENTHOL 3 MG MT LOZG: 1.0000 | LOZENGE | OROMUCOSAL | Status: DC | PRN

## 2020-02-28 MED ORDER — DEXAMETHASONE SODIUM PHOSPHATE 10 MG/ML IJ SOLN
INTRAMUSCULAR | Status: DC | PRN
  Administered 2020-02-28: 5 mg via INTRAVENOUS

## 2020-02-28 MED ORDER — ONDANSETRON HCL 4 MG/2ML IJ SOLN
INTRAMUSCULAR | Status: DC | PRN
  Administered 2020-02-28: 4 mg via INTRAVENOUS

## 2020-02-28 MED ORDER — ORAL CARE MOUTH RINSE: 15.0000 mL | Freq: Once | OROMUCOSAL | Status: AC

## 2020-02-28 MED ORDER — VITAMIN E 45 MG (100 UNIT) PO CAPS
400.0000 [IU] | ORAL_CAPSULE | Freq: Every day | ORAL | Status: DC
  Administered 2020-02-28 – 2020-02-29 (×2): 400 [IU] via ORAL
  Filled 2020-02-28 (×3): qty 4

## 2020-02-28 MED ORDER — LACTATED RINGERS IV SOLN: INTRAVENOUS | Status: DC | PRN

## 2020-02-28 MED ORDER — PROPOFOL 500 MG/50ML IV EMUL
INTRAVENOUS | Status: DC | PRN
  Administered 2020-02-28: 50 ug/kg/min via INTRAVENOUS
  Administered 2020-02-28: 100 ug/kg/min via INTRAVENOUS

## 2020-02-28 MED ORDER — SODIUM CHLORIDE FLUSH 0.9 % IV SOLN
INTRAVENOUS | Status: DC | PRN
  Administered 2020-02-28: 60 mL

## 2020-02-28 MED ORDER — LIDOCAINE 2% (20 MG/ML) 5 ML SYRINGE
INTRAMUSCULAR | Status: DC | PRN
  Administered 2020-02-28: 60 mg via INTRAVENOUS

## 2020-02-28 MED ORDER — HYDROMORPHONE HCL 1 MG/ML IJ SOLN
INTRAMUSCULAR | Status: AC
  Filled 2020-02-28: qty 0.5

## 2020-02-28 MED ORDER — FENTANYL CITRATE (PF) 100 MCG/2ML IJ SOLN
INTRAMUSCULAR | Status: DC | PRN
  Administered 2020-02-28: 50 ug via INTRAVENOUS
  Administered 2020-02-28 (×2): 100 ug via INTRAVENOUS

## 2020-02-28 MED ORDER — PHENOL 1.4 % MT LIQD: 1.0000 | OROMUCOSAL | Status: DC | PRN

## 2020-02-28 MED ORDER — THROMBIN 5000 UNITS EX SOLR
CUTANEOUS | Status: AC
  Filled 2020-02-28: qty 15000

## 2020-02-28 MED ORDER — POLYETHYLENE GLYCOL 3350 17 G PO PACK: 17.0000 g | PACK | ORAL | Status: DC

## 2020-02-28 MED ORDER — DOCUSATE SODIUM 100 MG PO CAPS
100.0000 mg | ORAL_CAPSULE | Freq: Two times a day (BID) | ORAL | Status: DC
  Administered 2020-02-28 – 2020-02-29 (×3): 100 mg via ORAL
  Filled 2020-02-28 (×3): qty 1

## 2020-02-28 SURGICAL SUPPLY — 105 items
ADH SKN CLS APL DERMABOND .7 (GAUZE/BANDAGES/DRESSINGS) ×6
BAND INSRT 18 STRL LF DISP RB (MISCELLANEOUS) ×6
BAND RUBBER #18 3X1/16 STRL (MISCELLANEOUS) ×6 IMPLANT
BASKET BONE COLLECTION (BASKET) IMPLANT
BATTALION LLIF ITRADISCAL SHIM (MISCELLANEOUS) ×5
BLADE CLIPPER SURG (BLADE) IMPLANT
BLADE SURG 11 STRL SS (BLADE) ×2 IMPLANT
BUR MATCHSTICK NEURO 3.0 LAGG (BURR) ×2 IMPLANT
BUR PRECISION FLUTE 5.0 (BURR) ×2 IMPLANT
BUR PRECISION MATCH 3.0 13 (BURR) ×2 IMPLANT
BUR PRECISION MATCH 3.0 13CM (BURR) ×1
CANISTER SUCT 3000ML PPV (MISCELLANEOUS) ×8 IMPLANT
CLIP SPRING STIM LLIF SAFEOP (CLIP) ×3 IMPLANT
CNTNR URN SCR LID CUP LEK RST (MISCELLANEOUS) ×3 IMPLANT
CONT SPEC 4OZ STRL OR WHT (MISCELLANEOUS) ×5
COVER BACK TABLE 60X90IN (DRAPES) ×5 IMPLANT
COVER WAND RF STERILE (DRAPES) ×4 IMPLANT
DECANTER SPIKE VIAL GLASS SM (MISCELLANEOUS) ×4 IMPLANT
DERMABOND ADVANCED (GAUZE/BANDAGES/DRESSINGS) ×4
DERMABOND ADVANCED .7 DNX12 (GAUZE/BANDAGES/DRESSINGS) ×8 IMPLANT
DILATOR INSULATED LLIF 8-13-18 (NEUROSURGERY SUPPLIES) ×3 IMPLANT
DISSECTOR BLUNT TIP ENDO 5MM (MISCELLANEOUS) IMPLANT
DRAIN JACKSON PRATT 10MM FLAT (MISCELLANEOUS) IMPLANT
DRAPE 3/4 80X56 (DRAPES) ×5 IMPLANT
DRAPE C-ARM 42X72 X-RAY (DRAPES) ×10 IMPLANT
DRAPE C-ARMOR (DRAPES) ×10 IMPLANT
DRAPE LAPAROTOMY 100X72X124 (DRAPES) ×10 IMPLANT
DRAPE MICROSCOPE LEICA (MISCELLANEOUS) ×5 IMPLANT
DRSG OPSITE POSTOP 4X6 (GAUZE/BANDAGES/DRESSINGS) IMPLANT
DURAPREP 26ML APPLICATOR (WOUND CARE) ×10 IMPLANT
ELECT BLADE INSULATED 6.5IN (ELECTROSURGICAL) ×5
ELECT COATED BLADE 2.86 ST (ELECTRODE) ×2 IMPLANT
ELECT KIT SAFEOP EMG/NMJ (KITS) ×5
ELECT REM PT RETURN 9FT ADLT (ELECTROSURGICAL) ×10
ELECTRODE BLDE INSULATED 6.5IN (ELECTROSURGICAL) ×3 IMPLANT
ELECTRODE REM PT RTRN 9FT ADLT (ELECTROSURGICAL) ×6 IMPLANT
EVACUATOR SILICONE 100CC (DRAIN) IMPLANT
EXTENDER TAB GUIDE SV 5.5/6.0 (INSTRUMENTS) ×48 IMPLANT
GAUZE 4X4 16PLY RFD (DISPOSABLE) IMPLANT
GAUZE SPONGE 4X4 12PLY STRL (GAUZE/BANDAGES/DRESSINGS) ×3 IMPLANT
GLOVE BIOGEL PI IND STRL 6 (GLOVE) ×3 IMPLANT
GLOVE BIOGEL PI IND STRL 7.5 (GLOVE) ×6 IMPLANT
GLOVE BIOGEL PI INDICATOR 6 (GLOVE) ×6
GLOVE BIOGEL PI INDICATOR 7.5 (GLOVE) ×4
GLOVE ECLIPSE 6.0 STRL STRAW (GLOVE) ×9 IMPLANT
GLOVE ECLIPSE 7.5 STRL STRAW (GLOVE) ×10 IMPLANT
GLOVE EXAM NITRILE XL STR (GLOVE) IMPLANT
GLOVE SURG SS PI 7.0 STRL IVOR (GLOVE) ×9 IMPLANT
GLOVE SURG UNDER POLY LF SZ6.5 (GLOVE) ×9 IMPLANT
GLOVE SURG UNDER POLY LF SZ7.5 (GLOVE) ×9 IMPLANT
GOWN STRL REUS W/ TWL LRG LVL3 (GOWN DISPOSABLE) ×8 IMPLANT
GOWN STRL REUS W/ TWL XL LVL3 (GOWN DISPOSABLE) ×9 IMPLANT
GOWN STRL REUS W/TWL 2XL LVL3 (GOWN DISPOSABLE) IMPLANT
GOWN STRL REUS W/TWL LRG LVL3 (GOWN DISPOSABLE) ×30
GOWN STRL REUS W/TWL XL LVL3 (GOWN DISPOSABLE) ×15
GUIDEWIRE BLUNT NT 450 (WIRE) ×24 IMPLANT
GUIDEWIRE LLIF TT 320 (WIRE) ×9 IMPLANT
HEMOSTAT POWDER KIT SURGIFOAM (HEMOSTASIS) ×5 IMPLANT
KIT BASIN OR (CUSTOM PROCEDURE TRAY) ×10 IMPLANT
KIT EMG SRFC ELECT (KITS) ×1 IMPLANT
KIT INFUSE SMALL (Orthopedic Implant) ×3 IMPLANT
KIT TURNOVER KIT B (KITS) ×10 IMPLANT
KNIFE ANNULOTOMY GREY RETRACT (ORTHOPEDIC DISPOSABLE SUPPLIES) ×3 IMPLANT
KNIFE GREY RETRACABLE ANNULOTOMY ×1
LIF ILLUMINATION SYSTEM STERIL (SYSTAGENIX WOUND MANAGEMENT) ×5
MATRIX STRIP NEOCORE 12C (Putty) ×1 IMPLANT
MILL MEDIUM DISP (BLADE) ×2 IMPLANT
NDL HYPO 18GX1.5 BLUNT FILL (NEEDLE) IMPLANT
NDL HYPO 21X1.5 SAFETY (NEEDLE) IMPLANT
NDL SPNL 18GX3.5 QUINCKE PK (NEEDLE) IMPLANT
NDL TROCAR PAK (NEEDLE) IMPLANT
NEEDLE HYPO 18GX1.5 BLUNT FILL (NEEDLE) IMPLANT
NEEDLE HYPO 21X1.5 SAFETY (NEEDLE) ×5 IMPLANT
NEEDLE HYPO 22GX1.5 SAFETY (NEEDLE) ×7 IMPLANT
NEEDLE SPNL 18GX3.5 QUINCKE PK (NEEDLE) ×5 IMPLANT
NEEDLE TROCAR PAK (NEEDLE) ×10 IMPLANT
NS IRRIG 1000ML POUR BTL (IV SOLUTION) ×7 IMPLANT
PACK LAMINECTOMY NEURO (CUSTOM PROCEDURE TRAY) ×10 IMPLANT
PAD ARMBOARD 7.5X6 YLW CONV (MISCELLANEOUS) ×24 IMPLANT
PROBE BALL TIP LLIF SAFEOP (NEUROSURGERY SUPPLIES) ×3 IMPLANT
ROD PERC CCM 5.5X110 (Rod) ×3 IMPLANT
ROD PERC CCM 5.5X90 (Rod) ×3 IMPLANT
SCREW 6.5X40 VOYAGER MAS FNS (Screw) ×3 IMPLANT
SCREW MAS FENS 6.5 45 (Screw) ×7 IMPLANT
SCREW MAS FENS 6.5X45 (Screw) ×35 IMPLANT
SCREW SET 5.5/6.0MM SOLERA (Screw) ×24 IMPLANT
SHIM ITRADISCAL BATTALION LLIF (MISCELLANEOUS) ×1 IMPLANT
SPACER IDENTITI 6X18X50 10D (Spacer) ×6 IMPLANT
SPACER LIF IDENTITI 8X18X45 10 (Spacer) ×3 IMPLANT
SPONGE LAP 4X18 RFD (DISPOSABLE) IMPLANT
SPONGE SURGIFOAM ABS GEL 100 (HEMOSTASIS) IMPLANT
SPONGE SURGIFOAM ABS GEL SZ50 (HEMOSTASIS) ×3 IMPLANT
SPONGE TONSIL TAPE 1 RFD (DISPOSABLE) IMPLANT
STAPLER VISISTAT 35W (STAPLE) ×8 IMPLANT
STRIP MATRIX NEOCORE 12CC (Putty) ×2 IMPLANT
SUT MNCRL AB 4-0 PS2 18 (SUTURE) ×10 IMPLANT
SUT VIC AB 0 CT1 18XCR BRD8 (SUTURE) ×6 IMPLANT
SUT VIC AB 0 CT1 8-18 (SUTURE) ×10
SUT VIC AB 2-0 CP2 18 (SUTURE) ×10 IMPLANT
SYR 30ML LL (SYRINGE) ×5 IMPLANT
SYSTEM ILLUMINATION LIF STERIL (SYSTAGENIX WOUND MANAGEMENT) ×1 IMPLANT
TOWEL GREEN STERILE (TOWEL DISPOSABLE) ×10 IMPLANT
TOWEL GREEN STERILE FF (TOWEL DISPOSABLE) ×10 IMPLANT
TRAY FOLEY MTR SLVR 16FR STAT (SET/KITS/TRAYS/PACK) ×7 IMPLANT
WATER STERILE IRR 1000ML POUR (IV SOLUTION) ×10 IMPLANT

## 2020-02-28 NOTE — Op Note (Signed)
Procedure(s): Lateral Lumbar Interbody Fusion Lumbar two- lumbar three, Lumbar three-four, Lumbar four-five RIGHT Minimally Invasive Right Lumbar four-five Laminectomy and Facetectomy with Percutaneous Pedicle Screw Placement Lumbar two-five Procedure Note  Cynthia Bradshaw female 75 y.o. 02/28/2020  Procedure(s) and Anesthesia Type:    * Lateral Lumbar Interbody Fusion Lumbar two- lumbar three, Lumbar three-four, Lumbar four-five RIGHT - General    * Minimally Invasive Right Lumbar four-five Laminectomy and Facetectomy with Percutaneous Pedicle Screw Placement Lumbar two-five - General  Surgeon(s) and Role:    Maisie Fus, Coy Saunas, MD - Primary    Coletta Memos, MD - Assisting   Indications: This is a 75 year old woman who presented to clinic for severe back pain with right leg radiculopathy and occasional left leg pain.  Her back pain was significantly worse with activity and was somewhat relieved with sitting down.  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  She had grade 1 spondylolisthesis at L4-5 and significant lateral listhesis at L2-3, L3-4, and L4-5, with severe disc degeneration as well as L3-4 and L4-5.  Unfortunately, nonsurgical therapies including physical therapy and medical management and injections did not help her and her pain and functional decline progressed significantly.  As such, I discussed with her surgical option o correction and stabilization of her severe deformity in her lumbar spine.  I explained that we would not attempt to correct her whole curve but the areas of most severe degeneration with associated stenosis.  She had osteopenia and was on Prolia.  Additionally, she was recently diagnosed with a left bundle branch block but had been cleared by cardiology as she had no symptoms from this.  Risks, benefits, alternatives, expected convalescence were discussed with her.  Risks  discussed included, but were not limited to, bleeding, pain, infection, scar, spinal fluid leak, pseudoarthrosis, adjacent segment disease, damage to nearby organs, and death.  Informed consent was obtained and she wished to proceed with surgery.     Surgeon: Bedelia Person   Assistants: Coletta Memos, MD.  Please note there were no qualified trainees available to assist with the procedure.  Assistance was required for aid in exposure and retraction of nearby anatomic structures.  Anesthesia: General with endotracheal intubation   Procedure Detail 1. Lateral interbody fusion L2-3, L3-4, L4-5 via right sided approach with correction of coronal deformity 2. Posterolateral arthrodesis, right L4-5 3. Right laminotomy and medial facetectomy for decompression, right L4-5 using minimally invasive tubular retractor 4. Segmental instrumentation with percutaneous pedicle screw and rod construct L2-3-4-5 5. Use of microscope for intraoperative microdissection 6. Harvest of local autograft 7. Use of morselized allograft.   The patient was brought to the operative room.  General anesthesia was induced and patient was intubated by the anesthesia service.  After appropriate lines and monitors were placed, patient was positioned in the lateral decubitus position with the right side up.  All pressure points padded and the eyes were protected.  The patient and the bed was positioned for a true lateral and AP C-arm x-rays at each level.  The flank was preprepped with alcohol and prepped and draped in sterile fashion.  A timeout was performed.  1% at lidocaine with epinephrine was injected in the planned incision.  Using the C-arm x-ray, a oblique incision was planned over the flank to span the 3 disc spaces at the concavity.  Incision was made with a 10 blade and the fascia  was opened.  The external oblique, internal oblique, and transversalis muscle and transversalis fascia were opened bluntly with spreading  instruments.  The retroperitoneal space was dissected and the peritoneal contents were reflected anteriorly.  The quadratus lumborum, transverse process and finally the psoas muscle was dissected out.  A initial dilator was placed on the lateral surface of the psoas muscle over the L2-3 disc space as confirmed by x-ray.  The dilator was then passed through the psoas muscle and docked onto the disc.  Stimulation showed there was no proximity of any lumbar plexus nerves.  The dilator was secured in place with a K wire and subsequent dilators were passed and stimulated and again yielded no neural activity nearby.  130 mm retractor was then passed onto the disc space and secured in place with a bed attachment.  The dilators were removed and the field on the posterior blade were directly stimulated.  There was no visual evidence or electromyographic evidence of nerves in the field.  The retractor was then opened cranially, caudally, and anteriorly and again the field was stimulated with no evidence of nerves in the field.  Annulotomy was performed with a retractable blade and discectomy was performed with pituitary rongeurs and box cutters.  The contralateral annulus was released with Cobb and the disc space was prepared with curettes and rasps.  Trial implants were placed and with the aid of fluoroscopy with AP and lateral projections, a size 8 mm lordotic graft was placed.  The retractor was then removed.  Attention was then turned to the L3-4 disc space where the lateral aspect of the psoas was palpated and initial dilator was placed over it with positioning confirmed by x-ray.  The dilator was then placed over the large lateral osteophyte at the L3-4 and stimulated which revealed the lumbar plexus nerves were posterior to the dilator but remote.  In similar fashion, subsequent dilators were placed and stimulated and finally the retractor was placed and secured to the bed attachment.  The field was stimulated and no  nerves were able to be identified in the field.  Posterior shim was placed to lock the retractor in place and the retractor blades were opened cranially, caudally, and anteriorly.  The collapsed disc space was entered with a osteotome using x-ray guidance.  Subsequently, Cobb was used to release the contralateral annulus.  This allowed good opening of the disc space.  The disc was fairly degenerated, with minimal disc remnants, but complete discectomy was performed with curettes and pituitary rongeurs.  The endplates were prepared with rasps.  Trials and a final size 6 implant was then placed under x-ray guidance.  The retractor was then removed.  Attention was then turned to L4-5.  Initial dilator was placed over the lateral psoas muscle.  The lateral psoas was stimulated with the nerve stimulating at 16 mA.  Initial dilator was placed over the L4-5 disc space as confirmed on x-ray.  Stimulation showed the lumbar plexus nerves are posterior and requiring greater than 10 mA for stimulation.  Subsequent dilators were placed and stimulated and finally retractor was placed and secured with bed attachment.  The field was stimulated and there was no evidence of lumbar plexus nerves in the field including at the posterior blade.  The posterior blade was secured with a shim and the retractor was opened cranially, caudally, and anteriorly.  The field was again stimulated with no identification of lumbar plexus nerves.  The collapsed disc space was similarly opened with  a annulotomy and discectomy was performed with rongeurs and ring curettes and the endplates were prepared with rasps.  Contralateral annulus was released with Cobb instruments.  Trials were placed and a size 6 lordotic graft was selected and placed and felt very snug.  The retractor was then removed.  There were no changes in the femoral nerve SSEPs throughout the case.  Final x-rays showed good reduction of her spondylolisthesis and lateral listhesis,  restoration of disc space height and foraminal height, and partial correction of her coronal deformity.  The retroperitoneal space was then irrigated and examined with no evidence of any bleeding.  The fascia was closed with 0 Vicryl stitches.  The dermal layer was closed with 2-0 Vicryl stitches.  Skin was closed with 4-0 Monocryl subcuticular manner followed by Dermabond.  Patient was then flipped prone on a Jackson table with all pressure points padded and eyes protected.  The lower back was preprepped with alcohol and prepped and draped in sterile fashion.  Using AP and lateral x-rays, paramedian incisions were made bilaterally for placement of percutaneous pedicle screws.  Pedicles were cannulated with a diamond tipped Jamshidi with confirmation of good placement with both AP and lateral x-rays.  The Jamshidi's were then replaced with K wires and Medtronic percutaneous pedicle screws were passed over the K wire under x-ray guidance into the vertebra and pedicles of L2, L3, and on the left side at L4 and L5.  K wires were left in at right L4 and L5 pedicles.  Metrx tubular retractor system was used, with initial dilator placed at the right L4-5 facet, with subsequent dilators used until a size 53mm with, 6 cm length tube with significant medial angulation was placed and secured to the bed attachment.  Small amount of remaining muscle was removed over the facet.  The superior articulating process of L5 and the inferior articulating process of the L4 was identified as well as the lateral aspect of the spinous process and right hemilamina.  The right L4 articular process was slipped forward compared with L5.  The microscope was introduced in the field.  High-speed drill and rongeurs were used to perform facetectomies of right L4 and L5 with laminotomy and removal of ligamentum flavum for decompression.  Ball ended probe was able to be passed easily in the epidural space in the foramina, confirming good  decompression.  Bone was harvested as autograft and was placed in the remaining facet as well as lateral to the remaining L5 superior articular process.  Meticulous hemostasis was obtained.  The wound was irrigated thoroughly with irrigation.  The L4 and L5 pedicle screws were then passed over the K wires on the right side under x-ray guidance.  There was good purchase.  Rods were then passed subfascially through the screw heads and secured in place with screw caps.  X-rays confirmed good positioning of the screws and the rods.  Compression was performed on the left side across L4-5 and the screw caps were final tightened.  Exparel mixed with Marcaine was then injected into the paraspinous muscles and the subcutaneous skin.  The fascia was closed with 0 Vicryl stitches.  The dermal layer was closed with 2-0 Vicryl stitches in buried interrupted fashion.  The skin was closed with 4-0 Monocryl in subcuticular manner followed by Dermabond.  Patient was then flipped supine and extubated by the anesthesia service.  All counts were correct at the end of surgery.  No complications were noted.  Findings: Successful partial correction of  her severe coronal deformity, with reduction of her lateral listhesis and  spondylolisthesis  Estimated Blood Loss:  200 mL         Drains: none         Blood Given: none          Specimens: None         Implants:  Alpha-Tec titanium IdentiTi lateral interbody implants L2-3: 8PH x 18 x 45 mm, 10 degree lordosis L3-4: 6PH x 18 x 50 mm, 10 degree lordosis L4-5: 6PH x 18 x 50 mm, 10 degree lordosis Medtronic percutaneous pedicle screws measuring 6.5 x 45 mm at L2, L3, L4, and L5 bilaterally, with the exception of 6.5 x 40 mm at right L2.  110 mm rod on the left and 90 mm rod on the right.        Complications:  * No complications entered in OR log *         Disposition: PACU - hemodynamically stable.         Condition: stable

## 2020-02-28 NOTE — Anesthesia Procedure Notes (Signed)
Arterial Line Insertion Start/End2/09/2020 8:15 AM, 02/28/2020 8:15 AM Performed by: Achille Rich, MD, CRNA  Preanesthetic checklist: patient identified, IV checked, site marked, risks and benefits discussed, surgical consent, monitors and equipment checked, pre-op evaluation, timeout performed and anesthesia consent Lidocaine 1% used for infiltration Right, radial was placed Catheter size: 20 G Hand hygiene performed  and maximum sterile barriers used   Attempts: 2 Procedure performed without using ultrasound guided technique. Following insertion, Biopatch. Post procedure assessment: normal  Post procedure complications: local hematoma. Patient tolerated the procedure well with no immediate complications. Additional procedure comments: One attempt made by SRNA, local hematoma developed, pressure applied. CRNA inserted successfully on second attempt. Marland Kitchen

## 2020-02-28 NOTE — Transfer of Care (Signed)
Immediate Anesthesia Transfer of Care Note  Patient: Cynthia Bradshaw  Procedure(s) Performed: Lateral Lumbar Interbody Fusion Lumbar two- lumbar three, Lumbar three-four, Lumbar four-five RIGHT (N/A Flank) Minimally Invasive Right Lumbar four-five Laminectomy and Facetectomy with Percutaneous Pedicle Screw Placement Lumbar two-five (N/A Back)  Patient Location: PACU  Anesthesia Type:General  Level of Consciousness: awake, alert  and oriented  Airway & Oxygen Therapy: Patient Spontanous Breathing and Patient connected to face mask oxygen  Post-op Assessment: Report given to RN and Post -op Vital signs reviewed and stable  Post vital signs: Reviewed and stable  Last Vitals:  Vitals Value Taken Time  BP 126/65 02/28/20 1720  Temp    Pulse 67 02/28/20 1724  Resp 9 02/28/20 1724  SpO2 100 % 02/28/20 1724  Vitals shown include unvalidated device data.  Last Pain:  Vitals:   02/28/20 0745  TempSrc:   PainSc: 0-No pain      Patients Stated Pain Goal: 3 (02/28/20 0745)  Complications: No complications documented.

## 2020-02-28 NOTE — Progress Notes (Signed)
Neurosurgery POC  Patient appears comfortable.  4/5 R DF, 4+/5 R KE, otw 5/5.  No complaints of numbness though exam limited at this point

## 2020-02-28 NOTE — Progress Notes (Signed)
Orthopedic Tech Progress Note Patient Details:  Cynthia Bradshaw 10-18-1945 [id number] Brace has been delivered to patient's room  Patient ID: [name], female   DOB: 10/23/1945, 75 y.o.   MRN: [id number]   Smitty Pluck 02/28/2020, 7:30 PM

## 2020-02-28 NOTE — H&P (Signed)
CC: back pain  HPI:     Patient is a 75 y.o. female presents with progressive back pain and right greater than left leg pain. The pain is worse with activity, relieved with rest. Nonsurgical therapies failed to improve the symptoms, and at this point she cannot perform normal activities like doing dishes.    Patient Active Problem List   Diagnosis Date Noted   Protein-calorie malnutrition, severe 09/17/2015   Hyponatremia 09/16/2015   Volume depletion 09/16/2015   IBS (irritable bowel syndrome) 09/16/2015   Past Medical History:  Diagnosis Date   Anxiety    Arthritis    lower back   Cardiomyopathy (HCC)    Constipation    Dysrhythmia    LBBB   GERD (gastroesophageal reflux disease)    Headache    High cholesterol    Hypertension    IBS (irritable bowel syndrome)    Migraines    Seasonal allergies     Past Surgical History:  Procedure Laterality Date   ABDOMINAL SURGERY     BREAST EXCISIONAL BIOPSY Left    COLON SURGERY     COLONOSCOPY     COLOSTOMY     COLOSTOMY REVERSAL      Medications Prior to Admission  Medication Sig Dispense Refill Last Dose   atorvastatin (LIPITOR) 20 MG tablet Take 20 mg by mouth daily.   02/28/2020 at 0500   benazepril (LOTENSIN) 10 MG tablet Take 10 mg by mouth daily.   02/27/2020 at Unknown time   cholecalciferol (VITAMIN D) 1000 units tablet Take 1,000 Units by mouth daily.    Past Week at Unknown time   denosumab (PROLIA) 60 MG/ML SOSY injection Inject 60 mg into the skin every 6 (six) months.      dicyclomine (BENTYL) 20 MG tablet Take 20 mg by mouth daily.   02/27/2020 at Unknown time   metoprolol succinate (TOPROL XL) 25 MG 24 hr tablet Take 1 tablet (25 mg total) by mouth daily. 90 tablet 3 02/28/2020 at 0500   Multiple Vitamin (MULTIVITAMIN WITH MINERALS) TABS tablet Take 1 tablet by mouth daily.   Past Week at Unknown time   Omega-3 Fatty Acids (FISH OIL ADULT GUMMIES PO) Take by mouth.   Past Week at  Unknown time   omeprazole (PRILOSEC) 20 MG capsule Take 20 mg by mouth daily.   02/28/2020 at 0500   polyethylene glycol (MIRALAX / GLYCOLAX) 17 g packet Take 17 g by mouth 2 (two) times a week.   Past Week at Unknown time   POTASSIUM PO Take 50 mg by mouth daily as needed (foot cramps).   Past Week at Unknown time   Probiotic Product (DIGESTIVE ADVANTAGE) CAPS Take 1 capsule by mouth daily.   Past Week at Unknown time   rizatriptan (MAXALT) 10 MG tablet Take 10 mg by mouth as needed for migraine. May repeat in 2 hours if needed      vitamin E 180 MG (400 UNITS) capsule Take 400 Units by mouth daily.   Past Week at Unknown time   No Known Allergies  Social History   Tobacco Use   Smoking status: Never Smoker   Smokeless tobacco: Never Used  Substance Use Topics   Alcohol use: Yes    Comment: occasional wine    Family History  Problem Relation Age of Onset   Breast cancer Neg Hx      Review of Systems Pertinent items noted in HPI and remainder of comprehensive ROS otherwise negative.  Objective:  Patient Vitals for the past 8 hrs:  BP Temp Temp src Pulse Resp SpO2 Height Weight  02/28/20 0643 (!) 192/89 98 F (36.7 C) Oral 87 17 98 % 5\' 4"  (1.626 m) 54.4 kg   No intake/output data recorded. No intake/output data recorded.      General : Alert, cooperative, no distress, appears stated age   Head:  Normocephalic/atraumatic    Eyes: PERRL, conjunctiva/corneas clear, EOM's intact. Fundi could not be visualized Neck: Supple Chest:  Respirations unlabored Chest wall: no tenderness or deformity Heart: Regular rate and rhythm Abdomen: Soft, nontender and nondistended Extremities: warm and well-perfused Skin: normal turgor, color and texture Neurologic:  Alert, oriented x 3.  Eyes open spontaneously. PERRL, EOMI, VFC, no facial droop. V1-3 intact.  No dysarthria, tongue protrusion symmetric.  CNII-XII intact. Normal strength, sensation and reflexes throughout.  No  pronator drift, full strength in legs.  + SLR       Data Review CBC:  Lab Results  Component Value Date   WBC 9.2 02/28/2020   RBC 4.29 02/28/2020   BMP:  Lab Results  Component Value Date   GLUCOSE 110 (H) 02/28/2020   CO2 22 02/28/2020   BUN 13 02/28/2020   CREATININE 0.75 02/28/2020   CALCIUM 9.6 02/28/2020    Assessment:   75 yo F with severe degenerative scoliosis and stenosis  Plan:   - plan for L2-3, L3-4, L4-5 DLIFs, possible TLIFs, PSIF -  I discussed in detail the procedure with the patient.  Risks discussed included but were not limited to bleeding, pain, infection, scar, recurrence, pseudoarthrosis, fracture, injury to nearby organs, adjacent segment disease, cerebrospinal fluid leak, neurologic deficit, paralysis, coma and death.  I also discussed with her the possibility of blood transfusion during surgery and consent to transfuse blood products if necessary was also obtained.  All questions and concerns were answered and understanding and agreement with the plan was verbalized.

## 2020-02-28 NOTE — Anesthesia Procedure Notes (Signed)
Procedure Name: Intubation Date/Time: 02/28/2020 8:55 AM Performed by: Griffin Dakin, CRNA Pre-anesthesia Checklist: Patient identified, Emergency Drugs available, Suction available and Patient being monitored Patient Re-evaluated:Patient Re-evaluated prior to induction Oxygen Delivery Method: Circle system utilized Preoxygenation: Pre-oxygenation with 100% oxygen Induction Type: IV induction Ventilation: Mask ventilation without difficulty Laryngoscope Size: Mac and 3 Grade View: Grade I Tube type: Oral Tube size: 7.0 mm Number of attempts: 1 Airway Equipment and Method: Stylet and Oral airway Placement Confirmation: ETT inserted through vocal cords under direct vision,  positive ETCO2 and breath sounds checked- equal and bilateral Secured at: 21 cm Tube secured with: Tape Dental Injury: Teeth and Oropharynx as per pre-operative assessment  Comments: Inserted by Paulina Fusi, SRNA

## 2020-02-29 ENCOUNTER — Inpatient Hospital Stay (HOSPITAL_COMMUNITY): Payer: Medicare Other

## 2020-02-29 ENCOUNTER — Encounter (HOSPITAL_COMMUNITY): Payer: Self-pay | Admitting: Neurosurgery

## 2020-02-29 NOTE — Progress Notes (Signed)
Subjective: Patient reports some back pain, right anterior thigh numbness.  Objective: Vital signs in last 24 hours: Temp:  [97.3 F (36.3 C)-98.4 F (36.9 C)] 98 F (36.7 C) (02/10 0733) Pulse Rate:  [64-94] 94 (02/10 0733) Resp:  [9-18] 16 (02/10 0733) BP: (104-155)/(58-81) 136/58 (02/10 0733) SpO2:  [94 %-100 %] 98 % (02/10 0733) Arterial Line BP: (111-117)/(52-53) 117/53 (02/09 1745)  Intake/Output from previous day: 02/09 0701 - 02/10 0700 In: 3100 [I.V.:3100] Out: 2805 [Urine:2555; Blood:250] Intake/Output this shift: No intake/output data recorded.  NAD, pleasant, breathing comfortably. Incisions c/d 4/5 R HF, 4+/5 R KE, 4/5 R EHL.  Otw full strength in LEs Partial sensory loss over anterior thigh.  Lab Results: Recent Labs    02/28/20 0757  WBC 9.2  HGB 13.2  HCT 41.1  PLT 386   BMET Recent Labs    02/28/20 0757  NA 135  K 4.8  CL 102  CO2 22  GLUCOSE 110*  BUN 13  CREATININE 0.75  CALCIUM 9.6    Studies/Results: DG Lumbar Spine 2-3 Views  Result Date: 02/28/2020 CLINICAL DATA:  L2 through L5 percutaneous pedicle screw placement. EXAM: LUMBAR SPINE - 2-3 VIEW; DG C-ARM 1-60 MIN COMPARISON:  Same day intraoperative fluoroscopic imaging. FINDINGS: Fluoro time: 7 minutes and 37 seconds. Reported radiation: 144.51 mGy. A single C-arm fluoroscopic image was obtained intraoperatively and submitted for post operative interpretation. This demonstrates placement of percutaneous pedicle screws, presumably at L2 through L5. There are previously placed intervening spacers. Levocurvature. Please see the performing provider's procedural report for further detail. IMPRESSION: Percutaneous pedicle screws, as detailed above. Electronically Signed   By: Feliberto Harts MD   On: 02/28/2020 17:08   DG Lumbar Spine 2-3 Views  Result Date: 02/28/2020 CLINICAL DATA:  Lumbar fusion. EXAM: LUMBAR SPINE - 2-3 VIEW COMPARISON:  CT lumbar spine 11/08/2019 FINDINGS: AP and lateral  C-arm images lumbar spine were obtained. There has been interval placement of interbody metal spacers at L2-3, L3-4, L4-5. Spacer position set satisfactory. Grade 1 anterolisthesis L4-5. No retained instrument. IMPRESSION: Interbody fusion L2-3, L3-4, L4-5. No retained instrument. These results were called by telephone at the time of interpretation on 02/28/2020 at 1:16 pm to provider Key, who verbally acknowledged these results. Electronically Signed   By: Marlan Palau M.D.   On: 02/28/2020 13:16   DG C-Arm 1-60 Min  Result Date: 02/28/2020 CLINICAL DATA:  L2 through L5 percutaneous pedicle screw placement. EXAM: LUMBAR SPINE - 2-3 VIEW; DG C-ARM 1-60 MIN COMPARISON:  Same day intraoperative fluoroscopic imaging. FINDINGS: Fluoro time: 7 minutes and 37 seconds. Reported radiation: 144.51 mGy. A single C-arm fluoroscopic image was obtained intraoperatively and submitted for post operative interpretation. This demonstrates placement of percutaneous pedicle screws, presumably at L2 through L5. There are previously placed intervening spacers. Levocurvature. Please see the performing provider's procedural report for further detail. IMPRESSION: Percutaneous pedicle screws, as detailed above. Electronically Signed   By: Feliberto Harts MD   On: 02/28/2020 17:08    Assessment/Plan: 75 yo F with severe degenerative scoliosis and spondylolisthesis with associated radiculopathy who is s/p L2-3-4-5 DLIFs and posterior stabilization and R L4-5 decompression POD#1.  Overall, she is doing well.  She has expected R psoas weakness, anterior thing numbness but minimal quad weakness.  Her preoperative radiculopathy appears to be improved. - cont PT/OT - brace when OOB - will get baseline standing x-ray - likely d/c tomorrow with possible home therapies   Cynthia Bradshaw 02/29/2020, 10:57 AM

## 2020-02-29 NOTE — Evaluation (Signed)
Physical Therapy Evaluation Patient Details Name: Cynthia Bradshaw MRN: [id number] DOB: [birth year] Today's Date: 02/29/2020   History of Present Illness  Cynthia Miles. Bradshaw is a 75 y.o. female s/p lateral lumbar interbody fusion L2-3, L3-4, L4-5. PMH includes anxiety, cardiomyopathy, IBS, GERD, HTN, and LBBB.    Clinical Impression  Pt admitted with above diagnosis. At the time of PT eval, pt was able to demonstrate transfers and ambulation with up to heavy min assist for recovery with R knee buckle. Pt had another buckling incident on the stairs and required max assist recover and prevent full fall. Issued gait belt and educated both pt and husband regarding safety. Pt appears to have a decreased awareness of safety and deficits. Pt was educated on precautions, brace application/wearing schedule, appropriate activity progression, and car transfer. Pt currently with functional limitations due to the deficits listed below (see PT Problem List). Pt will benefit from skilled PT to increase their independence and safety with mobility to allow discharge to the venue listed below.      Follow Up Recommendations Home health PT;Supervision/Assistance - 24 hour    Equipment Recommendations  None recommended by PT    Recommendations for Other Services       Precautions / Restrictions Precautions Precautions: Fall;Back Precaution Booklet Issued: Yes (comment) Precaution Comments: Handout provided and pt was cued for precautions during functional mobility Required Braces or Orthoses: Spinal Brace Spinal Brace: Lumbar corset Restrictions Weight Bearing Restrictions: No Other Position/Activity Restrictions: NO BLT      Mobility  Bed Mobility Overal bed mobility: Needs Assistance Bed Mobility: Rolling;Sidelying to Sit Rolling: Supervision Sidelying to sit: Supervision     Sit to sidelying: Min guard General bed mobility comments: increased time. Pt was able to transition to EOB with  min cues for proper log roll technique.    Transfers Overall transfer level: Needs assistance Equipment used: Rolling walker (2 wheeled) Transfers: Sit to/from Stand Sit to Stand: Min assist         General transfer comment: Light min assist for power-up to full stand. VC's for hand placement on seated surface for safety.  Ambulation/Gait Ambulation/Gait assistance: Min assist Gait Distance (Feet): 250 Feet Assistive device: Rolling walker (2 wheeled) Gait Pattern/deviations: Step-through pattern;Decreased stride length;Trunk flexed Gait velocity: Decreased Gait velocity interpretation: 1.31 - 2.62 ft/sec, indicative of limited community ambulator General Gait Details: VC's for improved posture and closer walker proximity throughout. Pt with 1 knee buckle requiring heavy min assist to recover and prevent fall.  Stairs Stairs: Yes Stairs assistance: Min guard;Max assist Stair Management: One rail Right;Step to pattern;Sideways Number of Stairs: 3 General stair comments: Initially sideways with BUE's on the railing. Pt was able to complete 3 stairs with cues for sequencing. When we approached the stairs pt turned sideways and grabbed the railing with both hands, so we progressed from there. She then appeared somewhat confused with why we were attempting sideways, so we attempted forwards as well. Pt with immediate R knee buckle and pt went down on the stair, requiring max assist to prevent full fall.  Wheelchair Mobility    Modified Rankin (Stroke Patients Only)       Balance Overall balance assessment: Needs assistance Sitting-balance support: No upper extremity supported;Feet supported Sitting balance-Leahy Scale: Fair     Standing balance support: No upper extremity supported;Single extremity supported;During functional activity Standing balance-Leahy Scale: Fair Standing balance comment: able to tolerate standing unsupported for short duration, improved stability with  at least single UE supported  Pertinent Vitals/Pain Pain Assessment: Faces Pain Score: 5  Faces Pain Scale: Hurts little more Pain Location: surgical site Pain Descriptors / Indicators: Sore;Operative site guarding;Grimacing Pain Intervention(s): Limited activity within patient's tolerance;Monitored during session;Repositioned    Home Living Family/patient expects to be discharged to:: Private residence Living Arrangements: Spouse/significant other Available Help at Discharge: Family;Available 24 hours/day Type of Home: House Home Access: Stairs to enter   [employer] of Steps: 6 - 3 steps, platform, 3 more steps Home Layout: One level (with 1 step that separates living area) Home Equipment: Walker - 2 wheels;Walker - 4 wheels      Prior Function Level of Independence: Independent         Comments: pt was independent with all ADL/IADL and functional mobility     Hand Dominance   Dominant Hand: Right    Extremity/Trunk Assessment   Upper Extremity Assessment Upper Extremity Assessment: Defer to OT evaluation    Lower Extremity Assessment Lower Extremity Assessment: RLE deficits/detail RLE Deficits / Details: pt reports numbness and tingling, decreased knee flexion. x2 buckling during OOB mobility. decreased light touch and decreased proprioception    Cervical / Trunk Assessment Cervical / Trunk Assessment: Other exceptions Cervical / Trunk Exceptions: s/p surgery  Communication   Communication: No difficulties  Cognition Arousal/Alertness: Awake/alert Behavior During Therapy: WFL for tasks assessed/performed Overall Cognitive Status: Impaired/Different from baseline Area of Impairment: Attention;Memory;Following commands;Safety/judgement;Awareness;Problem solving                   Current Attention Level: Selective Memory: Decreased short-term memory;Decreased recall of precautions Following Commands:  Follows one step commands consistently;Follows multi-step commands inconsistently;Follows multi-step commands with increased time Safety/Judgement: Decreased awareness of safety;Decreased awareness of deficits Awareness: Emergent Problem Solving: Slow processing;Decreased initiation;Difficulty sequencing;Requires verbal cues General Comments: Difficulty focusing on 1 question or 1 task at a time. Tangential at times. Appears to have decreased awareness of safety and her deficits, reporting that her LE's are numb but unable to confirm whether LE numbness was present prior to surgery. Later told OT the numbness was new.      General Comments General comments (skin integrity, edema, etc.): vss, husband present    Exercises     Assessment/Plan    PT Assessment Patient needs continued PT services  PT Problem List Decreased strength;Decreased activity tolerance;Decreased balance;Decreased mobility;Decreased knowledge of use of DME;Decreased safety awareness;Decreased knowledge of precautions;Pain       PT Treatment Interventions DME instruction;Gait training;Functional mobility training;Therapeutic activities;Stair training;Therapeutic exercise;Balance training;Neuromuscular re-education;Patient/family education;Cognitive remediation    PT Goals (Current goals can be found in the Care Plan section)  Acute Rehab PT Goals Patient Stated Goal: to go home PT Goal Formulation: With patient/family Time For Goal Achievement: 03/07/20 Potential to Achieve Goals: Good    Frequency Min 5X/week   Barriers to discharge        Co-evaluation               AM-PAC PT "6 Clicks" Mobility  Outcome Measure Help needed turning from your back to your side while in a flat bed without using bedrails?: None Help needed moving from lying on your back to sitting on the side of a flat bed without using bedrails?: A Little Help needed moving to and from a bed to a chair (including a wheelchair)?: A  Little Help needed standing up from a chair using your arms (e.g., wheelchair or bedside chair)?: A Little Help needed to walk in hospital room?: A Lot Help needed climbing  3-5 steps with a railing? : A Lot 6 Click Score: 17    End of Session Equipment Utilized During Treatment: Gait belt;Back brace Activity Tolerance: Patient tolerated treatment well Patient left: with call bell/phone within reach;with family/visitor present;Other (comment) (Sitting EOB awaiting OT) Nurse Communication: Mobility status;Precautions (high fall risk, needs gait belt, RW, and hands-on guarding for ambulation.) PT Visit Diagnosis: Unsteadiness on feet (R26.81);Pain;Other symptoms and signs involving the nervous system (R29.898) Pain - part of body:  (back)    Time: 6767-2094 PT Time Calculation (min) (ACUTE ONLY): 28 min   Charges:   PT Evaluation $PT Eval Low Complexity: 1 Low PT Treatments $Gait Training: 8-22 mins        Conni Slipper, PT, DPT Acute Rehabilitation Services Pager: [phone] Office: [phone]   Marylynn Pearson 02/29/2020, 11:22 AM

## 2020-02-29 NOTE — TOC Initial Note (Addendum)
Transition of Care [facility]) - Initial/Assessment Note    Patient Details  Name: ARICELA BERTAGNOLLI MRN: [id number] Date of Birth: 1945/05/06  Transition of Care [facility]) CM/SW Contact:    Kingsley Plan, RN Phone Number: 02/29/2020, 2:35 PM  Clinical Narrative:                  Spoke to patient and husband at bedside. Discussed PT recommendations. Both in agreement. Provided medicare.gov home health list.   First choice is [facility] , second choice is [facility].   Called [place] with [facility] they do not have the staff to cover [place].  Kandee Keen with [facility] accepted referral.    Will need MD orders and face to face for HHPT and HHOT.  3C nursing staff will provide 3 in1  Expected Discharge Plan: Home w Home Health Services     Patient Goals and CMS Choice Patient states their goals for this hospitalization and ongoing recovery are:: to return to home CMS Medicare.gov Compare Post Acute Care list provided to:: Patient Choice offered to / list presented to : [facility]  Expected Discharge Plan and Services Expected Discharge Plan: Home w Home Health Services   Discharge Planning Services: CM Consult Post Acute Care Choice: Home Health Living arrangements for the past 2 months: Single Family Home                           HH Arranged: PT,OT HH Agency: [facility] Date [facility] Agency Contacted: 02/29/20 Time HH Agency Contacted: [phone] Representative spoke with at [facility] Agency: Kandee Keen  Prior Living Arrangements/Services Living arrangements for the past 2 months: Single Family Home Lives with:: Spouse Patient language and need for interpreter reviewed:: Yes Do you feel safe going back to the place where you live?: Yes      Need for Family Participation in Patient Care: Yes (Comment) Care giver support system in place?: Yes (comment)   Criminal Activity/Legal Involvement Pertinent to Current Situation/Hospitalization: No - Comment as needed  Activities of  Daily Living      Permission Sought/Granted   Permission granted to share information with : Yes, Verbal Permission Granted  Share Information with NAME: husband Nikkole Placzek  Permission granted to share info w AGENCY: [facility] and [facility]        Emotional Assessment Appearance:: Appears stated age Attitude/Demeanor/Rapport: Engaged Affect (typically observed): Accepting Orientation: : Oriented to Self,Oriented to Place,Oriented to  Time,Oriented to Situation Alcohol / Substance Use: Not Applicable Psych Involvement: No (comment)  Admission diagnosis:  Lumbar radiculopathy [M54.16] Patient Active Problem List   Diagnosis Date Noted  . Lumbar radiculopathy 02/28/2020  . Protein-calorie malnutrition, severe 09/17/2015  . Hyponatremia 09/16/2015  . Volume depletion 09/16/2015  . IBS (irritable bowel syndrome) 09/16/2015   PCP:  [name], MD Pharmacy:   [facility] [place] - [address] [place] [address] Phone: [phone] Fax: [phone]     Social Determinants of Health (SDOH) Interventions    Readmission Risk Interventions No flowsheet data found.

## 2020-02-29 NOTE — Evaluation (Signed)
Occupational Therapy Evaluation Patient Details Name: Cynthia Bradshaw MRN: [id number] DOB: [birth year] Today's Date: 02/29/2020    History of Present Illness Cynthia Bradshaw is a 75 y.o. female s/p lateral lumbar interbody fusion L2-3, L3-4, L4-5. PMH includes anxiety, cardiomyopathy, IBS, GERD, HTN, and LBBB.   Clinical Impression   PTA, pt was living at home with her husband, pt reports she was independent with ADL/IADL and functional mobility. Pt currently requires minA for functional mobility at RW level. She reports numbness and tingling in her LLE, pt reported this was not present prior to sx. Per PT, pt with 2x knee buckling during session prior to OT. No knee buckling noted during this session. Pt demonstrates cognitive limitations (see cognition section) impacting her safety with self-care and mobility. Due to decline in current level of function, pt would benefit from acute OT to address established goals to facilitate safe D/C to venue listed below. At this time, recommend HHOT follow-up. Will continue to follow acutely.      Follow Up Recommendations  Home health OT;Supervision - Intermittent (with mobility)    Equipment Recommendations  3 in 1 bedside commode    Recommendations for Other Services       Precautions / Restrictions Precautions Precautions: Fall;Back Precaution Booklet Issued: Yes (comment) Precaution Comments: provided and verbally reviewed handout Required Braces or Orthoses: Spinal Brace Spinal Brace: Lumbar corset Restrictions Weight Bearing Restrictions: No Other Position/Activity Restrictions: NO BLT      Mobility Bed Mobility Overal bed mobility: Needs Assistance Bed Mobility: Sit to Sidelying;Rolling Rolling: Supervision       Sit to sidelying: Min guard General bed mobility comments: cues for proper technique    Transfers Overall transfer level: Needs assistance Equipment used: Rolling walker (2 wheeled) Transfers: Sit  to/from Stand Sit to Stand: Min assist         General transfer comment: minA for powerup from lower surface and for stability    Balance Overall balance assessment: Needs assistance Sitting-balance support: No upper extremity supported;Feet supported Sitting balance-Leahy Scale: Fair     Standing balance support: No upper extremity supported;Single extremity supported;During functional activity Standing balance-Leahy Scale: Fair Standing balance comment: able to tolerate standing unsupported for short duration, improved stability with at least single UE supported                           ADL either performed or assessed with clinical judgement   ADL Overall ADL's : Needs assistance/impaired Eating/Feeding: Independent   Grooming: Min guard;Standing Grooming Details (indicate cue type and reason): at sink level Upper Body Bathing: Set up;Sitting   Lower Body Bathing: Minimal assistance;Sit to/from stand   Upper Body Dressing : Minimal assistance;Sitting Upper Body Dressing Details (indicate cue type and reason): cues for proper donning/doffing of back brace Lower Body Dressing: Minimal assistance;Sit to/from stand Lower Body Dressing Details (indicate cue type and reason): assistance to don/doff LB clothing. Pt declines use of AE, pt agreeable to assist pt as needed Toilet Transfer: Minimal assistance;Ambulation Toilet Transfer Details (indicate cue type and reason): instability noted, no knee buckling, Toileting- Clothing Manipulation and Hygiene: Min guard;Sit to/from stand Toileting - Clothing Manipulation Details (indicate cue type and reason): cues for safe hand placement     Functional mobility during ADLs: Minimal assistance;Rolling walker General ADL Comments: pt with instability, RLE weakness, decreased safety awareness     Vision Baseline Vision/History: Wears glasses Wears Glasses: At all times Patient Visual Report:  No change from baseline        Perception     Praxis      Pertinent Vitals/Pain Pain Assessment: 0-10 Pain Score: 5  Pain Location: surgical site Pain Descriptors / Indicators: Sore;Guarding Pain Intervention(s): Limited activity within patient's tolerance;Monitored during session;RN gave pain meds during session     Hand Dominance Right   Extremity/Trunk Assessment Upper Extremity Assessment Upper Extremity Assessment: Overall WFL for tasks assessed   Lower Extremity Assessment Lower Extremity Assessment: RLE deficits/detail;Defer to PT evaluation RLE Deficits / Details: pt reports numbness and tingling, decreased knee flexion. no buckling noted this session. decreased light touch and decreased proprioception   Cervical / Trunk Assessment Cervical / Trunk Assessment: Other exceptions Cervical / Trunk Exceptions: back precautions   Communication Communication Communication: No difficulties   Cognition Arousal/Alertness: Awake/alert Behavior During Therapy: WFL for tasks assessed/performed Overall Cognitive Status: No family/caregiver present to determine baseline cognitive functioning                                 General Comments: husband present but did not specify if cognition different from baseline. Pt appears to have limitations with short term memory, requiring cues for donning/doffing back brace during 2 trials. Pt appears to have decreased awareness of safety/deficits. Pt reporting numbness in RLE, pt reports this was not occurring prior to sx. Pt had 2x knee buckling with PT prior to OT session, pt state "it was nothing". Pt requires cues for safety and fall prevention. Husband present during session but with limited interaction/engagement. Pt appeared to have difficulty distinguishing R vs L. Pt reports this is baseline.   General Comments  vss, husband present    Exercises     Shoulder Instructions      Home Living Family/patient expects to be discharged to:: Private  residence Living Arrangements: Spouse/significant other Available Help at Discharge: Family;Available 24 hours/day Type of Home: House Home Access: Stairs to enter [employer] of Steps: 3 platform 3   Home Layout: One level (with 1 step that separates living area)     [employer] Shower/Tub: Producer, television/film/video: Handicapped height Bathroom Accessibility: Yes How Accessible: Accessible via walker Home Equipment: Walker - 2 wheels;Walker - 4 wheels          Prior Functioning/Environment Level of Independence: Independent        Comments: pt was independent with all ADL/IADL and functional mobility        OT Problem List: Decreased activity tolerance;Impaired balance (sitting and/or standing);Decreased cognition;Decreased safety awareness;Decreased knowledge of precautions;Decreased knowledge of use of DME or AE;Pain      OT Treatment/Interventions: Self-care/ADL training;Therapeutic exercise;DME and/or AE instruction;Therapeutic activities;Cognitive remediation/compensation;Patient/family education;Balance training    OT Goals(Current goals can be found in the care plan section) Acute Rehab OT Goals Patient Stated Goal: to go home OT Goal Formulation: With patient Time For Goal Achievement: 03/14/20 Potential to Achieve Goals: Good ADL Goals Pt Will Perform Grooming: with modified independence;standing Pt Will Perform Lower Body Dressing: with modified independence;sit to/from stand Pt Will Transfer to Toilet: with modified independence;ambulating Additional ADL Goal #1: Pt will demonstrate adherence to 3/3 back precautions independently during ADL/IADL and functional mobility.  OT Frequency: Min 2X/week   Barriers to D/C:            Co-evaluation              AM-PAC OT "6 Clicks" Daily  Activity     Outcome Measure Help from another person eating meals?: None Help from another person taking care of personal grooming?: A Little Help  from another person toileting, which includes using toliet, bedpan, or urinal?: A Little Help from another person bathing (including washing, rinsing, drying)?: A Little Help from another person to put on and taking off regular upper body clothing?: A Little Help from another person to put on and taking off regular lower body clothing?: A Little 6 Click Score: 19   End of Session Equipment Utilized During Treatment: Rolling walker;Back brace Nurse Communication: Mobility status  Activity Tolerance: Patient tolerated treatment well Patient left: in bed;with call bell/phone within reach;with family/visitor present  OT Visit Diagnosis: Unsteadiness on feet (R26.81);Other abnormalities of gait and mobility (R26.89);Muscle weakness (generalized) (M62.81);Pain Pain - part of body:  (surgical site)                Time: 6834-1962 OT Time Calculation (min): 29 min Charges:  OT General Charges $OT Visit: 1 Visit OT Evaluation $OT Eval Moderate Complexity: 1 Mod OT Treatments $Self Care/Home Management : 8-22 mins  Cynthia Bradshaw OTR/L Acute Rehabilitation Services Office: [phone]   Cynthia Bradshaw 02/29/2020, 10:48 AM

## 2020-02-29 NOTE — Anesthesia Postprocedure Evaluation (Signed)
Anesthesia Post Note  Patient: GERLEAN CID  Procedure(s) Performed: Lateral Lumbar Interbody Fusion Lumbar two- lumbar three, Lumbar three-four, Lumbar four-five RIGHT (N/A Flank) Minimally Invasive Right Lumbar four-five Laminectomy and Facetectomy with Percutaneous Pedicle Screw Placement Lumbar two-five (N/A Back)     Patient location during evaluation: PACU Anesthesia Type: General Level of consciousness: awake and alert Pain management: pain level controlled Vital Signs Assessment: post-procedure vital signs reviewed and stable Respiratory status: spontaneous breathing, nonlabored ventilation, respiratory function stable and patient connected to nasal cannula oxygen Cardiovascular status: blood pressure returned to baseline and stable Postop Assessment: no apparent nausea or vomiting Anesthetic complications: no   No complications documented.  Last Vitals:  Vitals:   02/29/20 0358 02/29/20 0733  BP: (!) 141/75 (!) 136/58  Pulse: 83 94  Resp: 18 16  Temp: 36.5 C 36.7 C  SpO2: 97% 98%    Last Pain:  Vitals:   02/29/20 0733  TempSrc: Oral  PainSc:                  Jazmyne Beauchesne S

## 2020-03-01 MED ORDER — DOCUSATE SODIUM 100 MG PO CAPS: 100.0000 mg | ORAL_CAPSULE | Freq: Two times a day (BID) | ORAL | 2 refills | Status: DC

## 2020-03-01 MED ORDER — METHOCARBAMOL 500 MG PO TABS: 500.0000 mg | ORAL_TABLET | Freq: Four times a day (QID) | ORAL | 0 refills | Status: DC | PRN

## 2020-03-01 MED ORDER — OXYCODONE-ACETAMINOPHEN 5-325 MG PO TABS: 1.0000 | ORAL_TABLET | ORAL | 0 refills | Status: DC | PRN

## 2020-03-01 NOTE — Progress Notes (Signed)
Occupational Therapy Treatment Patient Details Name: Cynthia Bradshaw MRN: [id number] DOB: 1945/10/22 Today's Date: 03/01/2020    History of present illness Lanora Manis A. Perno is a 75 y.o. female s/p lateral lumbar interbody fusion L2-3, L3-4, L4-5. PMH includes anxiety, cardiomyopathy, IBS, GERD, HTN, and LBBB.   OT comments  Pt received sitting EOB. Pt reports improvement in BLE sensation and strength. Pt demonstrated improvement with stability, increased safety awareness and was able to recall 2/3 back precautions. Pt required minguard for grooming at sink level, LB dressing, and toilet transfer. Pt current level appropriate for d/c with husband and follow-up OT. Pt with anticipated d/c this date. OT will sign off at this time.    Follow Up Recommendations  Home health OT;Supervision - Intermittent    Equipment Recommendations  3 in 1 bedside commode    Recommendations for Other Services      Precautions / Restrictions Precautions Precautions: Fall;Back Precaution Booklet Issued: Yes (comment) Precaution Comments: Handout provided and pt was cued for precautions during functional mobility Required Braces or Orthoses: Spinal Brace Spinal Brace: Lumbar corset Restrictions Weight Bearing Restrictions: No Other Position/Activity Restrictions: NO BLT       Mobility Bed Mobility               General bed mobility comments: pt received sitting EOB, left sitting EOB  Transfers Overall transfer level: Needs assistance Equipment used: Rolling walker (2 wheeled) Transfers: Sit to/from Stand Sit to Stand: Min guard         General transfer comment: minguard for safety, cues for safe hand placement    Balance Overall balance assessment: Needs assistance Sitting-balance support: No upper extremity supported;Feet supported Sitting balance-Leahy Scale: Good     Standing balance support: No upper extremity supported;Single extremity supported;During functional  activity Standing balance-Leahy Scale: Fair Standing balance comment: improvement with standing balance                           ADL either performed or assessed with clinical judgement   ADL Overall ADL's : Needs assistance/impaired Eating/Feeding: Independent   Grooming: Supervision/safety Grooming Details (indicate cue type and reason): supervision for safety, utilizing compensatory strategies for adherence to precautions             Lower Body Dressing: Min guard;Sit to/from stand Lower Body Dressing Details (indicate cue type and reason): able to figure-4 to access BLE Toilet Transfer: Min guard;Ambulation Toilet Transfer Details (indicate cue type and reason): at RW level Toileting- Clothing Manipulation and Hygiene: Min guard;Sit to/from stand       Functional mobility during ADLs: Min guard;Rolling walker General ADL Comments: pt with improvement in stability improvement with strength in BLE     Vision       Perception     Praxis      Cognition Arousal/Alertness: Awake/alert Behavior During Therapy: WFL for tasks assessed/performed Overall Cognitive Status: Within Functional Limits for tasks assessed                                 General Comments: pt able to recall 2/3 precautions and demonstrated ability to problem solve through hypothetical scenarios with returning home to adhere to precautions. Pt demonstrated adherence to precautions during ADL        Exercises     Shoulder Instructions       General Comments vss, husband present, husband able to  recall 3/3 precautions    Pertinent Vitals/ Pain       Faces Pain Scale: Hurts little more Pain Location: surgical site Pain Descriptors / Indicators: Sore;Operative site guarding;Grimacing  Home Living Family/patient expects to be discharged to:: Private residence Living Arrangements: Spouse/significant other Available Help at Discharge: Family;Available 24  hours/day Type of Home: House Home Access: Stairs to enter [employer] of Steps: 6 - 3 steps, platform, 3 more steps   Home Layout: One level (with 1 step that separates living area)     [employer] Shower/Tub: Producer, television/film/video: Handicapped height Bathroom Accessibility: Yes   Home Equipment: Environmental consultant - 2 wheels;Walker - 4 wheels          Prior Functioning/Environment Level of Independence: Independent        Comments: pt was independent with all ADL/IADL and functional mobility   Frequency  Min 2X/week        Progress Toward Goals  OT Goals(current goals can now be found in the care plan section)  Progress towards OT goals: Progressing toward goals  Acute Rehab OT Goals Patient Stated Goal: to go home OT Goal Formulation: With patient Time For Goal Achievement: 03/14/20 Potential to Achieve Goals: Good ADL Goals Pt Will Perform Grooming: with modified independence;standing Pt Will Perform Lower Body Dressing: with modified independence;sit to/from stand Pt Will Transfer to Toilet: with modified independence;ambulating Additional ADL Goal #1: Pt will demonstrate adherence to 3/3 back precautions independently during ADL/IADL and functional mobility.  Plan Discharge plan remains appropriate    Co-evaluation                 AM-PAC OT "6 Clicks" Daily Activity     Outcome Measure   Help from another person eating meals?: None Help from another person taking care of personal grooming?: A Little Help from another person toileting, which includes using toliet, bedpan, or urinal?: A Little Help from another person bathing (including washing, rinsing, drying)?: A Little Help from another person to put on and taking off regular upper body clothing?: A Little Help from another person to put on and taking off regular lower body clothing?: A Little 6 Click Score: 19    End of Session Equipment Utilized During Treatment: Rolling  walker;Back brace  OT Visit Diagnosis: Unsteadiness on feet (R26.81);Other abnormalities of gait and mobility (R26.89);Muscle weakness (generalized) (M62.81);Pain   Activity Tolerance Patient tolerated treatment well   Patient Left in bed;with call bell/phone within reach;with family/visitor present   Nurse Communication Mobility status        Time: 0922-0933 OT Time Calculation (min): 11 min  Charges: OT General Charges $OT Visit: 1 Visit OT Treatments $Self Care/Home Management : 8-22 mins  Rosey Bath OTR/L Acute Rehabilitation Services Office: [phone]    JAZARAH CAPILI 03/01/2020, 9:42 AM

## 2020-03-01 NOTE — Discharge Summary (Signed)
Physician Discharge Summary  Patient ID: Cynthia Bradshaw MRN: [id number] DOB/AGE: [birth year] 75 y.o.  Admit date: 02/28/2020 Discharge date: 03/01/2020  Admission Diagnoses:  Lumbar scoliosis with radiculopathy  Discharge Diagnoses:  Same Active Problems:   Lumbar radiculopathy   Discharged Condition: Stable  Hospital Course:  Cynthia Bradshaw is a 75 y.o. female with severe degenerative lumbar scoliosis and progressive radiculopathy.  She underwent L2-3, L3-4, L4-5 DLIFs with minimally invasive right L4-5 decompression and percutaneous posterior instrumentation.  Postoperatively, she was admitted to the spine unit.  She had mild right anterior thigh numbness which appeared to be resolving and subtle right quadriceps weakness and psoas weakness.  However, she made good progress with physical therapy.  She was deemed ready for discharge home on 03/01/2020 with home physical therapy.  Treatments: Surgery - L2-3, L3-4, L4-5 DLIFs with minimally invasive right L4-5 decompression and percutaneous posterior instrumentation  Discharge Exam: Blood pressure (!) 133/47, pulse (!) 109, temperature 97.7 F (36.5 C), temperature source Oral, resp. rate 16, height 5\' 4"  (1.626 m), weight 54.4 kg, SpO2 98 %. Awake, alert, oriented Speech fluent, appropriate CN grossly intact 4/5 right hip flexion, 4+/5 knee extension, 4+/5 dorsiflexion and extensor hallucis longus, otherwise 5/5.   Wounds c/d/i  Disposition: Discharge disposition: 01-Home or Self Care       Discharge Instructions    Incentive spirometry RT   Complete by: As directed      Allergies as of 03/01/2020   No Known Allergies     Medication List    TAKE these medications   atorvastatin 20 MG tablet Commonly known as: LIPITOR Take 20 mg by mouth daily.   benazepril 10 MG tablet Commonly known as: LOTENSIN Take 10 mg by mouth daily.   cholecalciferol 1000 units tablet Commonly known as: VITAMIN D Take 1,000  Units by mouth daily.   denosumab 60 MG/ML Sosy injection Commonly known as: PROLIA Inject 60 mg into the skin every 6 (six) months.   dicyclomine 20 MG tablet Commonly known as: BENTYL Take 20 mg by mouth daily.   Digestive Advantage Caps Take 1 capsule by mouth daily.   docusate sodium 100 MG capsule Commonly known as: COLACE Take 1 capsule (100 mg total) by mouth 2 (two) times daily.   FISH OIL ADULT GUMMIES PO Take by mouth.   methocarbamol 500 MG tablet Commonly known as: ROBAXIN Take 1 tablet (500 mg total) by mouth every 6 (six) hours as needed for muscle spasms.   metoprolol succinate 25 MG 24 hr tablet Commonly known as: Toprol XL Take 1 tablet (25 mg total) by mouth daily.   multivitamin with minerals Tabs tablet Take 1 tablet by mouth daily.   omeprazole 20 MG capsule Commonly known as: PRILOSEC Take 20 mg by mouth daily.   oxyCODONE-acetaminophen 5-325 MG tablet Commonly known as: PERCOCET/ROXICET Take 1-2 tablets by mouth every 4 (four) hours as needed for moderate pain or severe pain.   polyethylene glycol 17 g packet Commonly known as: MIRALAX / GLYCOLAX Take 17 g by mouth 2 (two) times a week.   POTASSIUM PO Take 50 mg by mouth daily as needed (foot cramps).   rizatriptan 10 MG tablet Commonly known as: MAXALT Take 10 mg by mouth as needed for migraine. May repeat in 2 hours if needed   vitamin E 180 MG (400 UNITS) capsule Take 400 Units by mouth daily.       Follow-up Information    [facility], [facility] Follow up.  Specialty: Home Health Services Contact information: [address] [place] [address] [phone]               Signed: Bedelia Person 03/01/2020, 8:51 AM

## 2020-03-01 NOTE — Progress Notes (Signed)
Patient discharged home with home health. Discharged instructions given to patient and spouse. Patient has an appointment with Dr. Maisie Fus In 2 weeks

## 2020-03-01 NOTE — Discharge Instructions (Signed)
Wound Care Leave incision open to air. You may shower. Do not scrub directly on incision.  Do not put any creams, lotions, or ointments on incision. Activity Walk each and every day, increasing distance each day. No lifting greater than 5 lbs.  Avoid bending, arching, and twisting. No driving for 2 weeks; may ride as a passenger locally. If provided with back brace, wear when out of bed.  It is not necessary to wear in bed. Diet Resume your normal diet.  . Call Your Doctor If Any of These Occur Redness, drainage, or swelling at the wound.  Temperature greater than 101 degrees. Severe pain not relieved by pain medication. Incision starts to come apart. Follow Up Appt Call today for appointment in 2-3 weeks ([phone]) or for problems.  If you have any hardware placed in your spine, you will need an x-ray before your appointment. 

## 2020-03-01 NOTE — Progress Notes (Signed)
Physical Therapy Treatment Patient Details Name: Cynthia Bradshaw MRN: [id number] DOB: 24-Jan-1945 Today's Date: 03/01/2020    History of Present Illness Cynthia Bradshaw is a 75 y.o. female s/p lateral lumbar interbody fusion L2-3, L3-4, L4-5. PMH includes anxiety, cardiomyopathy, IBS, GERD, HTN, and LBBB.    PT Comments    Pt progressing well with post-op mobility. She was able to demonstrate transfers and ambulation with gross supervision for safety and RW for support. No knee buckling noted this session and pt with improved cognition and maintenance of precautions. Pt was educated on precautions, brace application/wearing schedule, appropriate activity progression, and car transfer. Will continue to follow.      Follow Up Recommendations  Home health PT;Supervision/Assistance - 24 hour     Equipment Recommendations  None recommended by PT    Recommendations for Other Services       Precautions / Restrictions Precautions Precautions: Fall;Back Precaution Booklet Issued: Yes (comment) Precaution Comments: Handout provided and pt was cued for precautions during functional mobility Required Braces or Orthoses: Spinal Brace Spinal Brace: Lumbar corset Restrictions Weight Bearing Restrictions: No Other Position/Activity Restrictions: NO BLT    Mobility  Bed Mobility Overal bed mobility: Needs Assistance Bed Mobility: Rolling;Sidelying to Sit Rolling: Modified independent (Device/Increase time) Sidelying to sit: Supervision       General bed mobility comments: Physically, able to complete with mod I but supervision provided and cues provided for optimal log roll technique.    Transfers Overall transfer level: Needs assistance Equipment used: Rolling walker (2 wheeled) Transfers: Sit to/from Stand Sit to Stand: Min guard         General transfer comment: minguard for safety, cues for safe hand placement  Ambulation/Gait Ambulation/Gait assistance:  Supervision Gait Distance (Feet): 250 Feet Assistive device: Rolling walker (2 wheeled) Gait Pattern/deviations: Step-through pattern;Decreased stride length;Trunk flexed Gait velocity: Decreased Gait velocity interpretation: 1.31 - 2.62 ft/sec, indicative of limited community ambulator General Gait Details: No knee buckling noted during gait training. Min cues for improved posture and closer walker proximity.   Stairs Stairs: Yes Stairs assistance: Min guard Stair Management: One rail Right;Step to pattern;Sideways Number of Stairs: 3 General stair comments: Pt was able to complete with no knee buckling or LOB.   Wheelchair Mobility    Modified Rankin (Stroke Patients Only)       Balance Overall balance assessment: Needs assistance Sitting-balance support: No upper extremity supported;Feet supported Sitting balance-Leahy Scale: Good     Standing balance support: No upper extremity supported;Single extremity supported;During functional activity Standing balance-Leahy Scale: Fair Standing balance comment: improvement with standing balance                            Cognition Arousal/Alertness: Awake/alert Behavior During Therapy: WFL for tasks assessed/performed Overall Cognitive Status: Within Functional Limits for tasks assessed                                 General Comments: pt able to recall 2/3 precautions and demonstrated ability to problem solve through hypothetical scenarios with returning home to adhere to precautions. Pt demonstrated adherence to precautions during ADL      Exercises      General Comments General comments (skin integrity, edema, etc.): vss, husband present, husband able to recall 3/3 precautions      Pertinent Vitals/Pain Pain Assessment: Faces Faces Pain Scale: Hurts little more Pain Location:  surgical site Pain Descriptors / Indicators: Sore;Operative site guarding;Grimacing Pain Intervention(s): Limited  activity within patient's tolerance;Monitored during session;Repositioned    Home Living Family/patient expects to be discharged to:: Private residence Living Arrangements: Spouse/significant other Available Help at Discharge: Family;Available 24 hours/day Type of Home: House Home Access: Stairs to enter   Home Layout: One level (with 1 step that separates living area) Home Equipment: Dan Humphreys - 2 wheels;Walker - 4 wheels      Prior Function Level of Independence: Independent      Comments: pt was independent with all ADL/IADL and functional mobility   PT Goals (current goals can now be found in the care plan section) Acute Rehab PT Goals Patient Stated Goal: to go home PT Goal Formulation: With patient/family Time For Goal Achievement: 03/07/20 Potential to Achieve Goals: Good Progress towards PT goals: Progressing toward goals    Frequency    Min 5X/week      PT Plan Current plan remains appropriate    Co-evaluation              AM-PAC PT "6 Clicks" Mobility   Outcome Measure  Help needed turning from your back to your side while in a flat bed without using bedrails?: None Help needed moving from lying on your back to sitting on the side of a flat bed without using bedrails?: None Help needed moving to and from a bed to a chair (including a wheelchair)?: None Help needed standing up from a chair using your arms (e.g., wheelchair or bedside chair)?: None Help needed to walk in hospital room?: None Help needed climbing 3-5 steps with a railing? : A Little 6 Click Score: 23    End of Session Equipment Utilized During Treatment: Gait belt;Back brace Activity Tolerance: Patient tolerated treatment well Patient left: with call bell/phone within reach (Sitting EOB awaiting OT) Nurse Communication: Mobility status PT Visit Diagnosis: Unsteadiness on feet (R26.81);Pain;Other symptoms and signs involving the nervous system (R29.898) Pain - part of body:  (back)      Time: 7371-0626 PT Time Calculation (min) (ACUTE ONLY): 10 min  Charges:  $Gait Training: 8-22 mins                     Cynthia Bradshaw, PT, DPT Acute Rehabilitation Services Pager: [phone] Office: [phone]    Cynthia Bradshaw 03/01/2020, 12:12 PM

## 2020-03-02 DIAGNOSIS — M47818 Spondylosis without myelopathy or radiculopathy, sacral and sacrococcygeal region: Secondary | ICD-10-CM | POA: Diagnosis not present

## 2020-03-02 DIAGNOSIS — I447 Left bundle-branch block, unspecified: Secondary | ICD-10-CM | POA: Diagnosis not present

## 2020-03-02 DIAGNOSIS — Z4782 Encounter for orthopedic aftercare following scoliosis surgery: Secondary | ICD-10-CM | POA: Diagnosis not present

## 2020-03-02 DIAGNOSIS — E43 Unspecified severe protein-calorie malnutrition: Secondary | ICD-10-CM | POA: Diagnosis not present

## 2020-03-02 DIAGNOSIS — F419 Anxiety disorder, unspecified: Secondary | ICD-10-CM | POA: Diagnosis not present

## 2020-03-02 DIAGNOSIS — E7800 Pure hypercholesterolemia, unspecified: Secondary | ICD-10-CM | POA: Diagnosis not present

## 2020-03-02 DIAGNOSIS — M4800 Spinal stenosis, site unspecified: Secondary | ICD-10-CM | POA: Diagnosis not present

## 2020-03-02 DIAGNOSIS — M4186 Other forms of scoliosis, lumbar region: Secondary | ICD-10-CM | POA: Diagnosis not present

## 2020-03-02 DIAGNOSIS — K581 Irritable bowel syndrome with constipation: Secondary | ICD-10-CM | POA: Diagnosis not present

## 2020-03-02 DIAGNOSIS — I10 Essential (primary) hypertension: Secondary | ICD-10-CM | POA: Diagnosis not present

## 2020-03-02 DIAGNOSIS — G43909 Migraine, unspecified, not intractable, without status migrainosus: Secondary | ICD-10-CM | POA: Diagnosis not present

## 2020-03-02 DIAGNOSIS — Z981 Arthrodesis status: Secondary | ICD-10-CM | POA: Diagnosis not present

## 2020-03-02 DIAGNOSIS — M4726 Other spondylosis with radiculopathy, lumbar region: Secondary | ICD-10-CM | POA: Diagnosis not present

## 2020-03-02 DIAGNOSIS — I429 Cardiomyopathy, unspecified: Secondary | ICD-10-CM | POA: Diagnosis not present

## 2020-03-02 DIAGNOSIS — M47817 Spondylosis without myelopathy or radiculopathy, lumbosacral region: Secondary | ICD-10-CM | POA: Diagnosis not present

## 2020-03-02 DIAGNOSIS — Z9181 History of falling: Secondary | ICD-10-CM | POA: Diagnosis not present

## 2020-03-02 DIAGNOSIS — K219 Gastro-esophageal reflux disease without esophagitis: Secondary | ICD-10-CM | POA: Diagnosis not present

## 2020-03-04 ENCOUNTER — Encounter (HOSPITAL_COMMUNITY): Payer: Self-pay | Admitting: Neurosurgery

## 2020-03-05 ENCOUNTER — Encounter: Payer: Self-pay | Admitting: Cardiology

## 2020-03-06 ENCOUNTER — Encounter (HOSPITAL_COMMUNITY): Payer: Self-pay | Admitting: Neurosurgery

## 2020-03-06 DIAGNOSIS — M4726 Other spondylosis with radiculopathy, lumbar region: Secondary | ICD-10-CM | POA: Diagnosis not present

## 2020-03-06 DIAGNOSIS — Z4782 Encounter for orthopedic aftercare following scoliosis surgery: Secondary | ICD-10-CM | POA: Diagnosis not present

## 2020-03-06 DIAGNOSIS — M47818 Spondylosis without myelopathy or radiculopathy, sacral and sacrococcygeal region: Secondary | ICD-10-CM | POA: Diagnosis not present

## 2020-03-06 DIAGNOSIS — M47817 Spondylosis without myelopathy or radiculopathy, lumbosacral region: Secondary | ICD-10-CM | POA: Diagnosis not present

## 2020-03-06 DIAGNOSIS — M4800 Spinal stenosis, site unspecified: Secondary | ICD-10-CM | POA: Diagnosis not present

## 2020-03-06 DIAGNOSIS — M4186 Other forms of scoliosis, lumbar region: Secondary | ICD-10-CM | POA: Diagnosis not present

## 2020-03-08 DIAGNOSIS — M4726 Other spondylosis with radiculopathy, lumbar region: Secondary | ICD-10-CM | POA: Diagnosis not present

## 2020-03-08 DIAGNOSIS — M4186 Other forms of scoliosis, lumbar region: Secondary | ICD-10-CM | POA: Diagnosis not present

## 2020-03-08 DIAGNOSIS — M47817 Spondylosis without myelopathy or radiculopathy, lumbosacral region: Secondary | ICD-10-CM | POA: Diagnosis not present

## 2020-03-08 DIAGNOSIS — M47818 Spondylosis without myelopathy or radiculopathy, sacral and sacrococcygeal region: Secondary | ICD-10-CM | POA: Diagnosis not present

## 2020-03-08 DIAGNOSIS — Z4782 Encounter for orthopedic aftercare following scoliosis surgery: Secondary | ICD-10-CM | POA: Diagnosis not present

## 2020-03-08 DIAGNOSIS — M4800 Spinal stenosis, site unspecified: Secondary | ICD-10-CM | POA: Diagnosis not present

## 2020-03-12 DIAGNOSIS — M47817 Spondylosis without myelopathy or radiculopathy, lumbosacral region: Secondary | ICD-10-CM | POA: Diagnosis not present

## 2020-03-12 DIAGNOSIS — M4800 Spinal stenosis, site unspecified: Secondary | ICD-10-CM | POA: Diagnosis not present

## 2020-03-12 DIAGNOSIS — M47818 Spondylosis without myelopathy or radiculopathy, sacral and sacrococcygeal region: Secondary | ICD-10-CM | POA: Diagnosis not present

## 2020-03-12 DIAGNOSIS — M4186 Other forms of scoliosis, lumbar region: Secondary | ICD-10-CM | POA: Diagnosis not present

## 2020-03-12 DIAGNOSIS — M4726 Other spondylosis with radiculopathy, lumbar region: Secondary | ICD-10-CM | POA: Diagnosis not present

## 2020-03-12 DIAGNOSIS — Z4782 Encounter for orthopedic aftercare following scoliosis surgery: Secondary | ICD-10-CM | POA: Diagnosis not present

## 2020-03-18 DIAGNOSIS — M4186 Other forms of scoliosis, lumbar region: Secondary | ICD-10-CM | POA: Diagnosis not present

## 2020-03-18 DIAGNOSIS — M47818 Spondylosis without myelopathy or radiculopathy, sacral and sacrococcygeal region: Secondary | ICD-10-CM | POA: Diagnosis not present

## 2020-03-18 DIAGNOSIS — Z4782 Encounter for orthopedic aftercare following scoliosis surgery: Secondary | ICD-10-CM | POA: Diagnosis not present

## 2020-03-18 DIAGNOSIS — M4726 Other spondylosis with radiculopathy, lumbar region: Secondary | ICD-10-CM | POA: Diagnosis not present

## 2020-03-18 DIAGNOSIS — M4800 Spinal stenosis, site unspecified: Secondary | ICD-10-CM | POA: Diagnosis not present

## 2020-03-18 DIAGNOSIS — M47817 Spondylosis without myelopathy or radiculopathy, lumbosacral region: Secondary | ICD-10-CM | POA: Diagnosis not present

## 2020-03-25 DIAGNOSIS — M4800 Spinal stenosis, site unspecified: Secondary | ICD-10-CM | POA: Diagnosis not present

## 2020-03-25 DIAGNOSIS — M47817 Spondylosis without myelopathy or radiculopathy, lumbosacral region: Secondary | ICD-10-CM | POA: Diagnosis not present

## 2020-03-25 DIAGNOSIS — M4186 Other forms of scoliosis, lumbar region: Secondary | ICD-10-CM | POA: Diagnosis not present

## 2020-03-25 DIAGNOSIS — M47818 Spondylosis without myelopathy or radiculopathy, sacral and sacrococcygeal region: Secondary | ICD-10-CM | POA: Diagnosis not present

## 2020-03-25 DIAGNOSIS — Z4782 Encounter for orthopedic aftercare following scoliosis surgery: Secondary | ICD-10-CM | POA: Diagnosis not present

## 2020-03-25 DIAGNOSIS — M4726 Other spondylosis with radiculopathy, lumbar region: Secondary | ICD-10-CM | POA: Diagnosis not present

## 2020-04-03 ENCOUNTER — Ambulatory Visit (HOSPITAL_COMMUNITY): Payer: Medicare Other

## 2020-04-15 DIAGNOSIS — I10 Essential (primary) hypertension: Secondary | ICD-10-CM | POA: Diagnosis not present

## 2020-04-15 DIAGNOSIS — M4180 Other forms of scoliosis, site unspecified: Secondary | ICD-10-CM | POA: Diagnosis not present

## 2020-04-17 DIAGNOSIS — I10 Essential (primary) hypertension: Secondary | ICD-10-CM | POA: Diagnosis not present

## 2020-04-17 DIAGNOSIS — Z0000 Encounter for general adult medical examination without abnormal findings: Secondary | ICD-10-CM | POA: Diagnosis not present

## 2020-04-17 DIAGNOSIS — K2100 Gastro-esophageal reflux disease with esophagitis, without bleeding: Secondary | ICD-10-CM | POA: Diagnosis not present

## 2020-04-17 DIAGNOSIS — K580 Irritable bowel syndrome with diarrhea: Secondary | ICD-10-CM | POA: Diagnosis not present

## 2020-04-17 DIAGNOSIS — E7800 Pure hypercholesterolemia, unspecified: Secondary | ICD-10-CM | POA: Diagnosis not present

## 2020-04-17 DIAGNOSIS — Z1389 Encounter for screening for other disorder: Secondary | ICD-10-CM | POA: Diagnosis not present

## 2020-04-17 DIAGNOSIS — I429 Cardiomyopathy, unspecified: Secondary | ICD-10-CM | POA: Diagnosis not present

## 2020-04-17 DIAGNOSIS — F39 Unspecified mood [affective] disorder: Secondary | ICD-10-CM | POA: Diagnosis not present

## 2020-04-17 DIAGNOSIS — M810 Age-related osteoporosis without current pathological fracture: Secondary | ICD-10-CM | POA: Diagnosis not present

## 2020-04-17 DIAGNOSIS — J309 Allergic rhinitis, unspecified: Secondary | ICD-10-CM | POA: Diagnosis not present

## 2020-04-17 DIAGNOSIS — G43909 Migraine, unspecified, not intractable, without status migrainosus: Secondary | ICD-10-CM | POA: Diagnosis not present

## 2020-04-30 ENCOUNTER — Telehealth (HOSPITAL_COMMUNITY): Payer: Self-pay | Admitting: Emergency Medicine

## 2020-04-30 DIAGNOSIS — Z01812 Encounter for preprocedural laboratory examination: Secondary | ICD-10-CM | POA: Diagnosis not present

## 2020-04-30 LAB — BASIC METABOLIC PANEL
BUN/Creatinine Ratio: 17 (ref 12–28)
BUN: 12 mg/dL (ref 8–27)
CO2: 20 mmol/L (ref 20–29)
Calcium: 9.3 mg/dL (ref 8.7–10.3)
Chloride: 101 mmol/L (ref 96–106)
Creatinine, Ser: 0.7 mg/dL (ref 0.57–1.00)
Glucose: 93 mg/dL (ref 65–99)
Potassium: 5.3 mmol/L — ABNORMAL HIGH (ref 3.5–5.2)
Sodium: 136 mmol/L (ref 134–144)
eGFR: 91 mL/min/{1.73_m2} (ref >59)

## 2020-04-30 NOTE — Telephone Encounter (Signed)
Reaching out to patient to offer assistance regarding upcoming cardiac imaging study; pt verbalizes understanding of appt date/time, parking situation and where to check in, pre-test NPO status and medications ordered, and verified current allergies; name and call back number provided for further questions should they arise Rockwell Alexandria RN Navigator Cardiac Imaging [facility] [phone] office [phone] cell  Pt will take 50mg  metoprolol succinate 2 hr prior to scan 

## 2020-05-01 ENCOUNTER — Encounter (HOSPITAL_COMMUNITY): Payer: Self-pay

## 2020-05-01 ENCOUNTER — Other Ambulatory Visit: Payer: Self-pay

## 2020-05-01 ENCOUNTER — Ambulatory Visit (HOSPITAL_COMMUNITY)
Admission: RE | Admit: 2020-05-01 | Discharge: 2020-05-01 | Disposition: A | Discharge status: Home / Self Care | Payer: Medicare Other | Source: Ambulatory Visit | Attending: Cardiology | Admitting: Cardiology

## 2020-05-01 DIAGNOSIS — I429 Cardiomyopathy, unspecified: Secondary | ICD-10-CM

## 2020-05-01 MED ORDER — NITROGLYCERIN 0.4 MG SL SUBL: 0.8000 mg | SUBLINGUAL_TABLET | Freq: Once | SUBLINGUAL | Status: DC

## 2020-05-01 NOTE — Progress Notes (Signed)
[facility] coronary scan. Patient takes 25 mg metoprolol succinate QD and was told to take an additional 50 mg 2 hr PTA. Upon initially attaching her to heart monitor she was reading 101-103 sinus tach with PACs. She would drop into the 90s briefly however would rebound back in to low 100s. SBP in the high 180s. Patient is asymptomatic and states " that her heart rate has been running low and high and not uncommon for BP to be in the 200s. No current complaints. Denies any lightheadedness nor dizziness. No double vision. Spoke with Dr. Cristal Deer and recommends to cancel scan and reschedule with prescribed Ivabradine. Patient is understanding of cancellation at this time. Ambulatory at discharge.

## 2020-05-10 ENCOUNTER — Telehealth (HOSPITAL_COMMUNITY): Payer: Self-pay | Admitting: Emergency Medicine

## 2020-05-10 DIAGNOSIS — Z01810 Encounter for preprocedural cardiovascular examination: Secondary | ICD-10-CM

## 2020-05-10 DIAGNOSIS — I429 Cardiomyopathy, unspecified: Secondary | ICD-10-CM

## 2020-05-10 DIAGNOSIS — I10 Essential (primary) hypertension: Secondary | ICD-10-CM

## 2020-05-10 MED ORDER — IVABRADINE HCL 5 MG PO TABS: 15.0000 mg | ORAL_TABLET | Freq: Once | ORAL | 0 refills | Status: DC

## 2020-05-10 MED ORDER — IVABRADINE HCL 5 MG PO TABS: ORAL_TABLET | ORAL | 0 refills | Status: DC

## 2020-05-10 NOTE — Addendum Note (Signed)
Addended by: Cheree Ditto on: 05/10/2020 11:54 AM   Modules accepted: Orders

## 2020-05-10 NOTE — Telephone Encounter (Signed)
Reaching out to patient to offer assistance regarding upcoming cardiac imaging study; pt verbalizes understanding of appt date/time, parking situation and where to check in, pre-test NPO status and medications ordered, and verified current allergies; name and call back number provided for further questions should they arise Rockwell Alexandria RN Navigator Cardiac Imaging [facility] [phone] office [phone] cell  Prescribing 5mg  ivabradine  

## 2020-05-10 NOTE — Telephone Encounter (Signed)
Corlanor 5mg  sample, 1 tablet, placed up front for pickup

## 2020-05-13 ENCOUNTER — Ambulatory Visit: Payer: Medicare Other | Admitting: Cardiology

## 2020-05-14 ENCOUNTER — Other Ambulatory Visit: Payer: Self-pay

## 2020-05-14 ENCOUNTER — Ambulatory Visit (HOSPITAL_COMMUNITY)
Admission: RE | Admit: 2020-05-14 | Discharge: 2020-05-14 | Disposition: A | Discharge status: Home / Self Care | Payer: Medicare Other | Source: Ambulatory Visit | Attending: Cardiology | Admitting: Cardiology

## 2020-05-14 DIAGNOSIS — I429 Cardiomyopathy, unspecified: Secondary | ICD-10-CM | POA: Diagnosis not present

## 2020-05-14 MED ORDER — METOPROLOL TARTRATE 5 MG/5ML IV SOLN: 5.0000 mg | Freq: Once | INTRAVENOUS | Status: AC

## 2020-05-14 MED ORDER — NITROGLYCERIN 0.4 MG SL SUBL
SUBLINGUAL_TABLET | SUBLINGUAL | Status: AC
  Filled 2020-05-14: qty 2

## 2020-05-14 MED ORDER — METOPROLOL TARTRATE 5 MG/5ML IV SOLN
INTRAVENOUS | Status: AC
  Administered 2020-05-14: 5 mg via INTRAVENOUS
  Filled 2020-05-14: qty 5

## 2020-05-14 MED ORDER — NITROGLYCERIN 0.4 MG SL SUBL
0.8000 mg | SUBLINGUAL_TABLET | Freq: Once | SUBLINGUAL | Status: AC
  Administered 2020-05-14: 0.8 mg via SUBLINGUAL

## 2020-05-14 MED ORDER — IOHEXOL 350 MG/ML SOLN
95.0000 mL | Freq: Once | INTRAVENOUS | Status: AC | PRN
  Administered 2020-05-14: 95 mL via INTRAVENOUS

## 2020-05-15 ENCOUNTER — Other Ambulatory Visit: Payer: Self-pay

## 2020-05-15 DIAGNOSIS — E875 Hyperkalemia: Secondary | ICD-10-CM

## 2020-05-27 DIAGNOSIS — I10 Essential (primary) hypertension: Secondary | ICD-10-CM | POA: Diagnosis not present

## 2020-05-27 DIAGNOSIS — M4180 Other forms of scoliosis, site unspecified: Secondary | ICD-10-CM | POA: Diagnosis not present

## 2020-05-31 ENCOUNTER — Ambulatory Visit: Payer: Medicare Other | Admitting: Adult Health

## 2020-06-10 ENCOUNTER — Telehealth: Payer: Self-pay | Admitting: Cardiology

## 2020-06-10 NOTE — Telephone Encounter (Signed)
Follow Up:    Pt says she would like to know her CT results from 05-14-20 please.

## 2020-06-10 NOTE — Telephone Encounter (Signed)
Patient calling in regards to CCTA results.   Advised once MD reviews will call with interpretation but preliminary results look ok/stable.    Patient request appt to be rescheduled with Dr. Cristal Deer.  appt rescheduled and cancelled upcoming appt with PA.   Patient aware and verbalized understanding.

## 2020-06-25 NOTE — Telephone Encounter (Signed)
The CT showed no calcium and no blockages, very reassuring.

## 2020-06-26 NOTE — Telephone Encounter (Signed)
Pt updated and verbalized understanding.  

## 2020-07-03 ENCOUNTER — Ambulatory Visit: Payer: Medicare Other | Admitting: Physician Assistant

## 2020-08-19 ENCOUNTER — Other Ambulatory Visit: Payer: Self-pay

## 2020-08-19 ENCOUNTER — Encounter (HOSPITAL_BASED_OUTPATIENT_CLINIC_OR_DEPARTMENT_OTHER): Payer: Self-pay | Admitting: Cardiology

## 2020-08-19 ENCOUNTER — Ambulatory Visit (INDEPENDENT_AMBULATORY_CARE_PROVIDER_SITE_OTHER): Payer: Medicare Other | Admitting: Cardiology

## 2020-08-19 VITALS — BP 138/80 | HR 56 | Ht 64.5 in | Wt 119.6 lb

## 2020-08-19 DIAGNOSIS — I428 Other cardiomyopathies: Secondary | ICD-10-CM | POA: Diagnosis not present

## 2020-08-19 DIAGNOSIS — Z7189 Other specified counseling: Secondary | ICD-10-CM | POA: Diagnosis not present

## 2020-08-19 DIAGNOSIS — Z79899 Other long term (current) drug therapy: Secondary | ICD-10-CM

## 2020-08-19 DIAGNOSIS — I447 Left bundle-branch block, unspecified: Secondary | ICD-10-CM

## 2020-08-19 DIAGNOSIS — I10 Essential (primary) hypertension: Secondary | ICD-10-CM | POA: Diagnosis not present

## 2020-08-19 MED ORDER — VALSARTAN 80 MG PO TABS: 80.0000 mg | ORAL_TABLET | Freq: Every day | ORAL | 3 refills | Status: DC

## 2020-08-19 NOTE — Patient Instructions (Signed)
Medication Instructions:  Stop benazepril 10 mg daily Start Valsartan 80 mg daily  *If you need a refill on your cardiac medications before your next appointment, please call your pharmacy*   Lab Work: Your physician recommends that you return for lab work in 2 weeks at our [facility] ([address] [place] [address], no lab appointment needed)  -BMP  If you have labs (blood work) drawn today and your tests are completely normal, you will receive your results only by: MyChart Message (if you have MyChart) OR A paper copy in the mail If you have any lab test that is abnormal or we need to change your treatment, we will call you to review the results.   Testing/Procedures: None ordered today   Follow-Up: At [facility], you and your health needs are our priority.  As part of our continuing mission to provide you with exceptional heart care, we have created designated Provider Care Teams.  These Care Teams include your primary Cardiologist (physician) and Advanced Practice Providers (APPs -  Physician Assistants and Nurse Practitioners) who all work together to provide you with the care you need, when you need it.  We recommend signing up for the patient portal called "MyChart".  Sign up information is provided on this After Visit Summary.  MyChart is used to connect with patients for Virtual Visits (Telemedicine).  Patients are able to view lab/test results, encounter notes, upcoming appointments, etc.  Non-urgent messages can be sent to your provider as well.   To learn more about what you can do with MyChart, go to [email or web address].    Your next appointment:   6 month(s)  The format for your next appointment:   In Person  Provider:   Jodelle Red, MD

## 2020-08-19 NOTE — Progress Notes (Signed)
Cardiology Office Note   Date:  08/19/2020   ID:  Cynthia Bradshaw, DOB 1945-04-29, MRN [id number]  PCP:  Kelton Pillar, MD  Cardiologist:  Buford Dresser, MD  Referring MD: Kelton Pillar, MD   CC: follow up  History of Present Illness:    Cynthia Bradshaw is a 75 y.o. female with a hx of hypertension, LBBB, nonischemic cardiomyopathy who is seen for follow up. I initially met her 12/22/19 as a new consult at the request of Kelton Pillar, MD for the evaluation and management of preoperative cardiovascular evaluation, LBBB. Echo performed showed reduced EF. CT cardiac without CAD.  Today: She is feeling great, and is now s/p lumbar fusion and percutaneous pedicle screw placement. There were no complications and she has recovered well. Overall she is feeling better than she has in years, and is able to walk to the mailbox without pain.   At home she has three blood pressure machines and monitors her blood pressure regularly.   For activity she is gardening. This is her most exertional activity. She feels no symptomatic limitations while gardening.  She continues to tolerate her medications well. We discussed other medication options including exchanging benazepril for valsartan. We also discussed entresto. See below.  She denies any chest pain, shortness of breath, or palpitations. No headaches, lightheadedness, or syncope to report. Also has no lower extremity edema, orthopnea or PND.   Past Medical History:  Diagnosis Date   Anxiety    Arthritis    lower back   Cardiomyopathy ([place])    Constipation    Dysrhythmia    LBBB   GERD (gastroesophageal reflux disease)    Headache    High cholesterol    Hypertension    IBS (irritable bowel syndrome)    Migraines    Seasonal allergies     Past Surgical History:  Procedure Laterality Date   ABDOMINAL SURGERY     ANTERIOR LAT LUMBAR FUSION N/A 02/28/2020   Procedure: Lateral Lumbar Interbody Fusion Lumbar  two- lumbar three, Lumbar three-four, Lumbar four-five RIGHT;  Surgeon: Vallarie Mare, MD;  Location: [facility];  Service: Neurosurgery;  Laterality: N/A;   BREAST EXCISIONAL BIOPSY Left    COLON SURGERY     COLONOSCOPY     COLOSTOMY     COLOSTOMY REVERSAL     LUMBAR PERCUTANEOUS PEDICLE SCREW 3 LEVEL N/A 02/28/2020   Procedure: Minimally Invasive Right Lumbar four-five Laminectomy and Facetectomy with Percutaneous Pedicle Screw Placement Lumbar two-five;  Surgeon: Vallarie Mare, MD;  Location: [facility];  Service: Neurosurgery;  Laterality: N/A;    Current Medications: Current Outpatient Medications on File Prior to Visit  Medication Sig   atorvastatin (LIPITOR) 20 MG tablet Take 20 mg by mouth daily.   cholecalciferol (VITAMIN D) 1000 units tablet Take 1,000 Units by mouth daily.    denosumab (PROLIA) 60 MG/ML SOSY injection Inject 60 mg into the skin every 6 (six) months.   dicyclomine (BENTYL) 20 MG tablet Take 20 mg by mouth daily.   docusate sodium (COLACE) 100 MG capsule Take 1 capsule (100 mg total) by mouth 2 (two) times daily.   metoprolol succinate (TOPROL XL) 25 MG 24 hr tablet Take 1 tablet (25 mg total) by mouth daily.   Multiple Vitamin (MULTIVITAMIN WITH MINERALS) TABS tablet Take 1 tablet by mouth daily.   Omega-3 Fatty Acids (FISH OIL ADULT GUMMIES PO) Take by mouth.   omeprazole (PRILOSEC) 20 MG capsule Take 20 mg by mouth daily.  polyethylene glycol (MIRALAX / GLYCOLAX) 17 g packet Take 17 g by mouth 2 (two) times a week.   POTASSIUM PO Take 50 mg by mouth daily as needed (foot cramps).   Probiotic Product (DIGESTIVE ADVANTAGE) CAPS Take 1 capsule by mouth daily.   rizatriptan (MAXALT) 10 MG tablet Take 10 mg by mouth as needed for migraine. May repeat in 2 hours if needed   vitamin E 180 MG (400 UNITS) capsule Take 400 Units by mouth daily.   No current facility-administered medications on file prior to visit.     Allergies:   Patient has no known allergies.    Social History   Tobacco Use   Smoking status: Never   Smokeless tobacco: Never  Vaping Use   Vaping Use: Never used  Substance Use Topics   Alcohol use: Yes    Comment: occasional wine   Drug use: No    Family History: family history is negative for Breast cancer.  ROS:   Please see the history of present illness.   Additional pertinent ROS otherwise unremarkable.  EKGs/Labs/Other Studies Reviewed:    The following studies were reviewed today:  Coronary CTA 05/14/2020: FINDINGS: A 120 kV prospective scan was triggered in the descending thoracic aorta at 111 HU's. Axial non-contrast 3 mm slices were carried out through the heart. The data set was analyzed on a dedicated work station and scored using the [place]. Gantry rotation speed was 250 msecs and collimation was .6 mm. No beta blockade and 0.8 mg of sl NTG was given. The 3D data set was reconstructed in 5% intervals of the 67-82 % of the R-R cycle. Diastolic phases were analyzed on a dedicated work station using MPR, MIP and VRT modes. The patient received 80 cc of contrast.   Aorta: Normal size. Ascending aorta 3.3 cm. No calcifications. No dissection.   Aortic Valve:  Trileaflet.  No calcifications.   Coronary Arteries:  Normal coronary origin.  Left dominance.   RCA is a small non-dominant artery that gives rise to PDA and PLVB. There is no plaque.   Left main is a large artery that gives rise to LAD and LCX arteries.   LAD is a large vessel that has no plaque. There are two small diagonal vessels without plaque.   LCX is a dominant artery that gives rise to two OM branches and L-PDA. There is no plaque.   Coronary Calcium Score: 0   Other findings: Normal pulmonary vein drainage into the left atrium.   Normal let atrial appendage without a thrombus.   Normal size of the pulmonary artery.   There is a small accessory left atrial appendage.   Mild mitral annular calcification.    IMPRESSION: 1. Coronary calcium score of 0. This was 0 percentile for age-, race-, and sex-matched controls. 2. Normal coronary origin with right dominance. 3. No evidence of CAD.  Echo 01/16/20  1. Left ventricular ejection fraction, by estimation, is 40 to 45%. The  left ventricle has mildly decreased function. The left ventricle has no  regional wall motion abnormalities. Left ventricular diastolic parameters  are consistent with Grade I  diastolic dysfunction (impaired relaxation).   2. Right ventricular systolic function is normal. The right ventricular  size is normal. There is normal pulmonary artery systolic pressure.   3. The mitral valve is normal in structure. No evidence of mitral valve  regurgitation. No evidence of mitral stenosis.   4. The aortic valve is normal in structure. Aortic valve  regurgitation is  mild. Mild to moderate aortic valve sclerosis/calcification is present,  without any evidence of aortic stenosis. Aortic regurgitation PHT measures  337 msec.   5. The inferior vena cava is normal in size with greater than 50%  respiratory variability, suggesting right atrial pressure of 3 mmHg.   EKG:  EKG is personally reviewed.   08/19/2020: sinus bradycardia at 56 bpm, LBBB with QRS 148 msec 02/09/2020: EKG was not ordered. 12/22/19: NSR, LBBB with QRS 140 msec  Recent Labs: 02/28/2020: Hemoglobin 13.2; Platelets 386 04/30/2020: BUN 12; Creatinine, Ser 0.70; Potassium 5.3; Sodium 136  Recent Lipid Panel No results found for: CHOL, TRIG, HDL, CHOLHDL, VLDL, LDLCALC, LDLDIRECT  Physical Exam:    VS:  BP 138/80   Pulse (!) 56   Ht 5' 4.5" (1.638 m)   Wt 119 lb 9.6 oz (54.3 kg)   LMP  (LMP Unknown)   BMI 20.21 kg/m     Wt Readings from Last 3 Encounters:  08/19/20 119 lb 9.6 oz (54.3 kg)  02/28/20 119 lb 14.9 oz (54.4 kg)  02/09/20 120 lb (54.4 kg)    GEN: Well nourished, well developed in no acute distress HEENT: Normal, moist mucous membranes NECK: No  JVD CARDIAC: regular rhythm, normal S1 and S2, no rubs or gallops. No murmur. VASCULAR: Radial and DP pulses 2+ bilaterally. No carotid bruits RESPIRATORY:  Clear to auscultation without rales, wheezing or rhonchi  ABDOMEN: Soft, non-tender, non-distended MUSCULOSKELETAL:  Ambulates independently SKIN: Warm and dry, no edema NEUROLOGIC:  Alert and oriented x 3. No focal neuro deficits noted. PSYCHIATRIC:  Normal affect    ASSESSMENT:    1. NICM (nonischemic cardiomyopathy) ([place])   2. LBBB (left bundle branch block)   3. Medication management   4. Essential hypertension   5. Cardiac risk counseling   6. Counseling on health promotion and disease prevention     PLAN:    Abnormal Echo LBBB Cardiomyopathy, nonischemic -reviewed her ECG, echo, CT cardiac today -continue metoprolol succinate -We did discuss ARBs and entresto today. She is willing to change benazepril to ARB, ordered today. Recheck BMET in 2-3 weeks for medication management -NYHA class I and therefore does not meet criteria for CRT. We did discuss symptoms to monitor for today -we discussed repeat echo today, but given that she is asymptomatic and would not change management, will hold on repeating at this time.  Hypertension: -BP above goal today, changing benazepril to valsartan as above  Cardiac risk counseling and prevention recommendations: -recommend heart healthy/Mediterranean diet, with whole grains, fruits, vegetable, fish, lean meats, nuts, and olive oil. Limit salt. -recommend moderate walking, 3-5 times/week for 30-50 minutes each session. Aim for at least 150 minutes.week. Goal should be pace of 3 miles/hours, or walking 1.5 miles in 30 minutes -recommend avoidance of tobacco products. Avoid excess alcohol. -ASCVD risk score: The ASCVD Risk score Mikey Bussing DC Jr., et al., 2013) failed to calculate for the following reasons:   Cannot find a previous HDL lab   Cannot find a previous total cholesterol lab     Plan for follow up: 6 mos or sooner as needed.  Buford Dresser, MD, PhD, [facility]    Medication Adjustments/Labs and Tests Ordered: Current medicines are reviewed at length with the patient today.  Concerns regarding medicines are outlined above.  Orders Placed This Encounter  Procedures   Basic metabolic panel   EKG 56-LSLH    Meds ordered this encounter  Medications  valsartan (DIOVAN) 80 MG tablet    Sig: Take 1 tablet (80 mg total) by mouth daily.    Dispense:  90 tablet    Refill:  3    Replaces benazepril    Patient Instructions  Medication Instructions:  Stop benazepril 10 mg daily Start Valsartan 80 mg daily  *If you need a refill on your cardiac medications before your next appointment, please call your pharmacy*   Lab Work: Your physician recommends that you return for lab work in 2 weeks at our [facility] ([place] [address], [place] [address], no lab appointment needed)  -BMP  If you have labs (blood work) drawn today and your tests are completely normal, you will receive your results only by: [place] (if you have [place]) OR A paper copy in the mail If you have any lab test that is abnormal or we need to change your treatment, we will call you to review the results.   Testing/Procedures: None ordered today   Follow-Up: At [facility], you and your health needs are our priority.  As part of our continuing mission to provide you with exceptional heart care, we have created designated Provider Care Teams.  These Care Teams include your primary Cardiologist (physician) and Advanced Practice Providers (APPs -  Physician Assistants and Nurse Practitioners) who all work together to provide you with the care you need, when you need it.  We recommend signing up for the patient portal called "MyChart".  Sign up information is provided on this After Visit Summary.  MyChart is used to connect with patients for  Virtual Visits (Telemedicine).  Patients are able to view lab/test results, encounter notes, upcoming appointments, etc.  Non-urgent messages can be sent to your provider as well.   To learn more about what you can do with MyChart, go to [email or web address].    Your next appointment:   6 month(s)  The format for your next appointment:   In Person  Provider:   Buford Dresser, MD      [facility],acting as a scribe for Buford Dresser, MD.,have documented all relevant documentation on the behalf of Buford Dresser, MD,as directed by  Buford Dresser, MD while in the presence of Buford Dresser, MD.  I, Buford Dresser, MD, have reviewed all documentation for this visit. The documentation on 08/19/20 for the exam, diagnosis, procedures, and orders are all accurate and complete.   Signed, Buford Dresser, MD PhD 08/19/2020  [facility]

## 2020-08-23 ENCOUNTER — Encounter (HOSPITAL_BASED_OUTPATIENT_CLINIC_OR_DEPARTMENT_OTHER): Payer: Self-pay

## 2020-08-28 DIAGNOSIS — H532 Diplopia: Secondary | ICD-10-CM | POA: Diagnosis not present

## 2020-08-28 DIAGNOSIS — H10413 Chronic giant papillary conjunctivitis, bilateral: Secondary | ICD-10-CM | POA: Diagnosis not present

## 2020-08-28 DIAGNOSIS — H2513 Age-related nuclear cataract, bilateral: Secondary | ICD-10-CM | POA: Diagnosis not present

## 2020-08-28 DIAGNOSIS — H04123 Dry eye syndrome of bilateral lacrimal glands: Secondary | ICD-10-CM | POA: Diagnosis not present

## 2020-08-28 DIAGNOSIS — H5022 Vertical strabismus, left eye: Secondary | ICD-10-CM | POA: Diagnosis not present

## 2020-09-09 DIAGNOSIS — K5900 Constipation, unspecified: Secondary | ICD-10-CM | POA: Diagnosis not present

## 2020-09-09 DIAGNOSIS — Z1211 Encounter for screening for malignant neoplasm of colon: Secondary | ICD-10-CM | POA: Diagnosis not present

## 2020-09-10 ENCOUNTER — Telehealth: Payer: Self-pay | Admitting: Cardiology

## 2020-09-10 NOTE — Telephone Encounter (Signed)
Pt c/o medication issue:  1. Name of Medication:  valsartan (DIOVAN) 80 MG tablet  2. How are you currently taking this medication (dosage and times per day)? 1 tablet by mouth daily   3. Are you having a reaction (difficulty breathing--STAT)? No   4. What is your medication issue? Cynthia Bradshaw is calling stating she went to the gastrologist yesterday and her BP got up to 193/over something. She reports if they are unable to get her BP under control she will not be able to get her colonoscopy. She is wanting to discuss this medication with a nurse due to thinking it is not working the way it should. Her BP at the time of the call was 137/81.

## 2020-09-10 NOTE — Telephone Encounter (Signed)
Spoke with the patient who reports that her BP was elevated at her GI appointment and that they will not go through with her colonoscopy if her BP is not controlled. She states that diastolic was 193. She reports that BP is always elevated when she goes to the doctors. She reports at home her BP runs 130-140s/70-80s. She has been feeling fine and has no other complaints. She has been taking valsartan 80 mg daily.  She would like to know if she needs an increased dose.

## 2020-09-28 NOTE — Telephone Encounter (Signed)
The issue is that her BP is generally well controlled, so she does not need long term adjustments to her regimen. I do not have the note from her GI doctor--can we get a copy? It would be nearly impossible for diastolic to be 193 (perhaps this was systolic?). She is very good at checking home BP. If we could get the GI note and an updated log of home blood pressures, this would be helpful to determine if we need to make adjustments to her regimen.

## 2020-10-01 NOTE — Telephone Encounter (Signed)
Left message to call back  

## 2020-10-01 NOTE — Telephone Encounter (Signed)
Follow Up: ° ° °Pt is calling you back. °

## 2020-10-01 NOTE — Telephone Encounter (Signed)
Pt advised of MD's recommendations and state they are proceeding with colonoscopy tomorrow.

## 2020-10-02 DIAGNOSIS — K5730 Diverticulosis of large intestine without perforation or abscess without bleeding: Secondary | ICD-10-CM | POA: Diagnosis not present

## 2020-10-02 DIAGNOSIS — Z1211 Encounter for screening for malignant neoplasm of colon: Secondary | ICD-10-CM | POA: Diagnosis not present

## 2020-10-02 DIAGNOSIS — Z980 Intestinal bypass and anastomosis status: Secondary | ICD-10-CM | POA: Diagnosis not present

## 2020-10-11 DIAGNOSIS — M4180 Other forms of scoliosis, site unspecified: Secondary | ICD-10-CM | POA: Diagnosis not present

## 2020-11-20 DIAGNOSIS — I10 Essential (primary) hypertension: Secondary | ICD-10-CM | POA: Diagnosis not present

## 2020-11-20 DIAGNOSIS — E7800 Pure hypercholesterolemia, unspecified: Secondary | ICD-10-CM | POA: Diagnosis not present

## 2020-11-20 DIAGNOSIS — F411 Generalized anxiety disorder: Secondary | ICD-10-CM | POA: Diagnosis not present

## 2020-11-20 DIAGNOSIS — M810 Age-related osteoporosis without current pathological fracture: Secondary | ICD-10-CM | POA: Diagnosis not present

## 2020-11-20 DIAGNOSIS — Z23 Encounter for immunization: Secondary | ICD-10-CM | POA: Diagnosis not present

## 2020-11-20 DIAGNOSIS — K580 Irritable bowel syndrome with diarrhea: Secondary | ICD-10-CM | POA: Diagnosis not present

## 2020-12-01 ENCOUNTER — Encounter: Payer: Self-pay | Admitting: Cardiology

## 2021-02-20 ENCOUNTER — Ambulatory Visit (INDEPENDENT_AMBULATORY_CARE_PROVIDER_SITE_OTHER): Payer: Medicare Other | Admitting: Cardiology

## 2021-02-20 ENCOUNTER — Encounter (HOSPITAL_BASED_OUTPATIENT_CLINIC_OR_DEPARTMENT_OTHER): Payer: Self-pay | Admitting: Cardiology

## 2021-02-20 ENCOUNTER — Other Ambulatory Visit: Payer: Self-pay

## 2021-02-20 VITALS — BP 178/78 | HR 92 | Ht 64.5 in | Wt 120.8 lb

## 2021-02-20 DIAGNOSIS — I428 Other cardiomyopathies: Secondary | ICD-10-CM

## 2021-02-20 DIAGNOSIS — Z7189 Other specified counseling: Secondary | ICD-10-CM | POA: Diagnosis not present

## 2021-02-20 DIAGNOSIS — I447 Left bundle-branch block, unspecified: Secondary | ICD-10-CM

## 2021-02-20 DIAGNOSIS — I10 Essential (primary) hypertension: Secondary | ICD-10-CM | POA: Diagnosis not present

## 2021-02-20 NOTE — Progress Notes (Signed)
Cardiology Office Note   Date:  02/20/2021   ID:  Cynthia Bradshaw 01-Aug-1945, MRN [id number]  PCP:  Kelton Pillar, MD  Cardiologist:  Buford Dresser, MD  Referring MD: Kelton Pillar, MD   CC: follow up  History of Present Illness:    Cynthia Bradshaw is a 76 y.o. female with a hx of hypertension, LBBB, nonischemic cardiomyopathy who is seen for follow up. I initially met her 12/22/19 as a new consult at the request of Kelton Pillar, MD for the evaluation and management of preoperative cardiovascular evaluation, LBBB. Echo performed showed reduced EF. CT cardiac without CAD.  Today: Doing well overall. Did take cold medication prior to visit today. Has had URI/allergies.   Staying busy, hopes to restart gardening soon. Doing well with activity since back surgery.  Hasn't been checking BP at home as it has been good. Tolerating valsartan and metoprolol. Recently BP at goal at Dr. Delene Ruffini office.   Denies chest pain, shortness of breath at rest or with normal exertion. No PND, orthopnea, LE edema or unexpected weight gain. No syncope or palpitations.   Past Medical History:  Diagnosis Date   Anxiety    Arthritis    lower back   Cardiomyopathy ([place])    Constipation    Dysrhythmia    LBBB   GERD (gastroesophageal reflux disease)    Headache    High cholesterol    Hypertension    IBS (irritable bowel syndrome)    Migraines    Seasonal allergies     Past Surgical History:  Procedure Laterality Date   ABDOMINAL SURGERY     ANTERIOR LAT LUMBAR FUSION N/A 02/28/2020   Procedure: Lateral Lumbar Interbody Fusion Lumbar two- lumbar three, Lumbar three-four, Lumbar four-five RIGHT;  Surgeon: Vallarie Mare, MD;  Location: [facility];  Service: Neurosurgery;  Laterality: N/A;   BREAST EXCISIONAL BIOPSY Left    COLON SURGERY     COLONOSCOPY     COLOSTOMY     COLOSTOMY REVERSAL     LUMBAR PERCUTANEOUS PEDICLE SCREW 3 LEVEL N/A 02/28/2020   Procedure:  Minimally Invasive Right Lumbar four-five Laminectomy and Facetectomy with Percutaneous Pedicle Screw Placement Lumbar two-five;  Surgeon: Vallarie Mare, MD;  Location: [facility];  Service: Neurosurgery;  Laterality: N/A;    Current Medications: Current Outpatient Medications on File Prior to Visit  Medication Sig   ALPRAZolam (XANAX) 0.25 MG tablet Take 0.25 mg by mouth as needed.   atorvastatin (LIPITOR) 20 MG tablet Take 20 mg by mouth daily.   cholecalciferol (VITAMIN D) 1000 units tablet Take 1,000 Units by mouth daily.    denosumab (PROLIA) 60 MG/ML SOSY injection Inject 60 mg into the skin every 6 (six) months.   dicyclomine (BENTYL) 20 MG tablet Take 20 mg by mouth daily.   docusate sodium (COLACE) 100 MG capsule Take 1 capsule (100 mg total) by mouth 2 (two) times daily.   metoprolol succinate (TOPROL XL) 25 MG 24 hr tablet Take 1 tablet (25 mg total) by mouth daily.   Multiple Vitamin (MULTIVITAMIN WITH MINERALS) TABS tablet Take 1 tablet by mouth daily.   Omega-3 Fatty Acids (FISH OIL ADULT GUMMIES PO) Take by mouth.   omeprazole (PRILOSEC) 20 MG capsule Take 20 mg by mouth daily.   polyethylene glycol (MIRALAX / GLYCOLAX) 17 g packet Take 17 g by mouth 2 (two) times a week.   POTASSIUM PO Take 50 mg by mouth daily as needed (foot cramps).   Probiotic  Product (DIGESTIVE ADVANTAGE) CAPS Take 1 capsule by mouth daily.   rizatriptan (MAXALT) 10 MG tablet Take 10 mg by mouth as needed for migraine. May repeat in 2 hours if needed   valsartan (DIOVAN) 80 MG tablet Take 1 tablet (80 mg total) by mouth daily.   vitamin E 180 MG (400 UNITS) capsule Take 400 Units by mouth daily.   No current facility-administered medications on file prior to visit.     Allergies:   Patient has no known allergies.   Social History   Tobacco Use   Smoking status: Never   Smokeless tobacco: Never  Vaping Use   Vaping Use: Never used  Substance Use Topics   Alcohol use: Yes    Comment: occasional  wine   Drug use: No    Family History: family history is negative for Breast cancer.  ROS:   Please see the history of present illness.   Additional pertinent ROS otherwise unremarkable.  EKGs/Labs/Other Studies Reviewed:    The following studies were reviewed today:  Coronary CTA 05/14/2020: FINDINGS: A 120 kV prospective scan was triggered in the descending thoracic aorta at 111 HU's. Axial non-contrast 3 mm slices were carried out through the heart. The data set was analyzed on a dedicated work station and scored using the [place]. Gantry rotation speed was 250 msecs and collimation was .6 mm. No beta blockade and 0.8 mg of sl NTG was given. The 3D data set was reconstructed in 5% intervals of the 67-82 % of the R-R cycle. Diastolic phases were analyzed on a dedicated work station using MPR, MIP and VRT modes. The patient received 80 cc of contrast.   Aorta: Normal size. Ascending aorta 3.3 cm. No calcifications. No dissection.   Aortic Valve:  Trileaflet.  No calcifications.   Coronary Arteries:  Normal coronary origin.  Left dominance.   RCA is a small non-dominant artery that gives rise to PDA and PLVB. There is no plaque.   Left main is a large artery that gives rise to LAD and LCX arteries.   LAD is a large vessel that has no plaque. There are two small diagonal vessels without plaque.   LCX is a dominant artery that gives rise to two OM branches and L-PDA. There is no plaque.   Coronary Calcium Score: 0   Other findings: Normal pulmonary vein drainage into the left atrium.   Normal let atrial appendage without a thrombus.   Normal size of the pulmonary artery.   There is a small accessory left atrial appendage.   Mild mitral annular calcification.   IMPRESSION: 1. Coronary calcium score of 0. This was 0 percentile for age-, race-, and sex-matched controls. 2. Normal coronary origin with right dominance. 3. No evidence of CAD.  Echo  01/16/20  1. Left ventricular ejection fraction, by estimation, is 40 to 45%. The  left ventricle has mildly decreased function. The left ventricle has no  regional wall motion abnormalities. Left ventricular diastolic parameters  are consistent with Grade I  diastolic dysfunction (impaired relaxation).   2. Right ventricular systolic function is normal. The right ventricular  size is normal. There is normal pulmonary artery systolic pressure.   3. The mitral valve is normal in structure. No evidence of mitral valve  regurgitation. No evidence of mitral stenosis.   4. The aortic valve is normal in structure. Aortic valve regurgitation is  mild. Mild to moderate aortic valve sclerosis/calcification is present,  without any evidence of aortic stenosis.  Aortic regurgitation PHT measures  337 msec.   5. The inferior vena cava is normal in size with greater than 50%  respiratory variability, suggesting right atrial pressure of 3 mmHg.   EKG:  EKG is personally reviewed.   08/19/2020: sinus bradycardia at 56 bpm, LBBB with QRS 148 msec 02/09/2020: EKG was not ordered. 12/22/19: NSR, LBBB with QRS 140 msec  Recent Labs: 02/28/2020: Hemoglobin 13.2; Platelets 386 04/30/2020: BUN 12; Creatinine, Ser 0.70; Potassium 5.3; Sodium 136  Recent Lipid Panel No results found for: CHOL, TRIG, HDL, CHOLHDL, VLDL, LDLCALC, LDLDIRECT  Physical Exam:    VS:  BP (!) 178/78    Pulse 92    Ht 5' 4.5" (1.638 m)    Wt 120 lb 12.8 oz (54.8 kg)    LMP  (LMP Unknown)    SpO2 98%    BMI 20.42 kg/m     Wt Readings from Last 3 Encounters:  02/20/21 120 lb 12.8 oz (54.8 kg)  08/19/20 119 lb 9.6 oz (54.3 kg)  02/28/20 119 lb 14.9 oz (54.4 kg)    GEN: Well nourished, well developed in no acute distress HEENT: Normal, moist mucous membranes NECK: No JVD CARDIAC: regular rhythm, normal S1 and S2, no rubs or gallops. 1/6 systolic murmur. VASCULAR: Radial and DP pulses 2+ bilaterally. No carotid bruits RESPIRATORY:   Clear to auscultation without rales, wheezing or rhonchi  ABDOMEN: Soft, non-tender, non-distended MUSCULOSKELETAL:  Ambulates independently SKIN: Warm and dry, no edema NEUROLOGIC:  Alert and oriented x 3. No focal neuro deficits noted. PSYCHIATRIC:  Normal affect     ASSESSMENT:    1. NICM (nonischemic cardiomyopathy) ([place])   2. LBBB (left bundle branch block)   3. Essential hypertension   4. Cardiac risk counseling   5. White coat syndrome with diagnosis of hypertension      PLAN:    Abnormal Echo, EF 40-45% LBBB Cardiomyopathy, nonischemic -reviewed her ECG, echo, CT cardiac -continue metoprolol succinate, valsartan -we have previously discussed entresto -NYHA class I and therefore does not meet criteria for CRT. We did discuss symptoms to monitor for  -we have discussed repeat echo, but given that she is asymptomatic and would not change management, will hold on repeating at this time.  Hypertension: -BP above goal today -always higher in the office. Has 3 cuffs at home that all agree -she will check BP at home and send me numbers, goal <130/80 -continue valsartan  Cardiac risk counseling and prevention recommendations: -recommend heart healthy/Mediterranean diet, with whole grains, fruits, vegetable, fish, lean meats, nuts, and olive oil. Limit salt. -recommend moderate walking, 3-5 times/week for 30-50 minutes each session. Aim for at least 150 minutes.week. Goal should be pace of 3 miles/hours, or walking 1.5 miles in 30 minutes -recommend avoidance of tobacco products. Avoid excess alcohol.  Plan for follow up: 3 mos or sooner as needed.  Buford Dresser, MD, PhD, [facility]    Medication Adjustments/Labs and Tests Ordered: Current medicines are reviewed at length with the patient today.  Concerns regarding medicines are outlined above.  No orders of the defined types were placed in this encounter.   No orders of the defined  types were placed in this encounter.   Patient Instructions  Medication Instructions:  Your Physician recommend you continue on your current medication as directed.    *If you need a refill on your cardiac medications before your next appointment, please call your pharmacy*   Lab Work: None ordered  today   Testing/Procedures: None ordered today   Follow-Up: At [facility], you and your health needs are our priority.  As part of our continuing mission to provide you with exceptional heart care, we have created designated Provider Care Teams.  These Care Teams include your primary Cardiologist (physician) and Advanced Practice Providers (APPs -  Physician Assistants and Nurse Practitioners) who all work together to provide you with the care you need, when you need it.  We recommend signing up for the patient portal called "MyChart".  Sign up information is provided on this After Visit Summary.  MyChart is used to connect with patients for Virtual Visits (Telemedicine).  Patients are able to view lab/test results, encounter notes, upcoming appointments, etc.  Non-urgent messages can be sent to your provider as well.   To learn more about what you can do with MyChart, go to [email or web address].    Your next appointment:   3 month(s)  The format for your next appointment:   In Person  Provider:   Buford Dresser, MD        Signed, Buford Dresser, MD PhD 02/20/2021  [place]

## 2021-02-20 NOTE — Patient Instructions (Signed)

## 2021-03-10 DIAGNOSIS — M4180 Other forms of scoliosis, site unspecified: Secondary | ICD-10-CM | POA: Diagnosis not present

## 2021-04-16 ENCOUNTER — Telehealth: Payer: Self-pay | Admitting: Cardiology

## 2021-04-16 DIAGNOSIS — I429 Cardiomyopathy, unspecified: Secondary | ICD-10-CM

## 2021-04-16 MED ORDER — METOPROLOL SUCCINATE ER 25 MG PO TB24: 25.0000 mg | ORAL_TABLET | Freq: Every day | ORAL | 3 refills | Status: DC

## 2021-04-16 NOTE — Telephone Encounter (Signed)
?*  STAT* If patient is at the pharmacy, call can be transferred to refill team. ? ? ?1. Which medications need to be refilled? (please list name of each medication and dose if known) metoprolol succinate (TOPROL XL) 25 MG 24 hr tablet ? ?2. Which pharmacy/location (including street and city if local pharmacy) is medication to be sent to? [facility] [place], [place] - [address] ? ?3. Do they need a 30 day or 90 day supply? 90  ? ? ?

## 2021-05-20 ENCOUNTER — Ambulatory Visit (INDEPENDENT_AMBULATORY_CARE_PROVIDER_SITE_OTHER): Payer: Medicare Other | Admitting: Cardiology

## 2021-05-20 ENCOUNTER — Encounter (HOSPITAL_BASED_OUTPATIENT_CLINIC_OR_DEPARTMENT_OTHER): Payer: Self-pay | Admitting: Cardiology

## 2021-05-20 VITALS — BP 156/80 | HR 87 | Ht 64.5 in | Wt 121.0 lb

## 2021-05-20 DIAGNOSIS — I447 Left bundle-branch block, unspecified: Secondary | ICD-10-CM | POA: Diagnosis not present

## 2021-05-20 DIAGNOSIS — I10 Essential (primary) hypertension: Secondary | ICD-10-CM | POA: Diagnosis not present

## 2021-05-20 DIAGNOSIS — Z7189 Other specified counseling: Secondary | ICD-10-CM

## 2021-05-20 DIAGNOSIS — I428 Other cardiomyopathies: Secondary | ICD-10-CM | POA: Diagnosis not present

## 2021-05-20 MED ORDER — VALSARTAN 160 MG PO TABS
160.0000 mg | ORAL_TABLET | Freq: Every day | ORAL | 3 refills | Status: DC
Start: 2021-05-20 — End: 2022-06-22

## 2021-05-20 NOTE — Patient Instructions (Addendum)
Medication Instructions:  ?If kidney function and potassium are ok, we will increase valsartan to 160 mg daily. Goal <130/80, if you see low numbers (<100/50) or feel dizzy/lightheaded please call and let us know. ? ?*If you need a refill on your cardiac medications before your next appointment, please call your pharmacy* ? ?Lab Work: ?BMET TODAY  ? ?If you have labs (blood work) drawn today and your tests are completely normal, you will receive your results only by: ?MyChart Message (if you have MyChart) OR ?A paper copy in the mail ?If you have any lab test that is abnormal or we need to change your treatment, we will call you to review the results. ? ?Testing/Procedures: ?NONE  ? ?Follow-Up: ?At [facility], you and your health needs are our priority.  As part of our continuing mission to provide you with exceptional heart care, we have created designated Provider Care Teams.  These Care Teams include your primary Cardiologist (physician) and Advanced Practice Providers (APPs -  Physician Assistants and Nurse Practitioners) who all work together to provide you with the care you need, when you need it. ? ?We recommend signing up for the patient portal called "MyChart".  Sign up information is provided on this After Visit Summary.  MyChart is used to connect with patients for Virtual Visits (Telemedicine).  Patients are able to view lab/test results, encounter notes, upcoming appointments, etc.  Non-urgent messages can be sent to your provider as well.   ?To learn more about what you can do with MyChart, go to [email or web address].   ? ?Your next appointment:   ?6 month(s) ? ?The format for your next appointment:   ?In Person ? ?Provider:   ?Buford Dresser, MD{ ? ? ? ? ? ? ?

## 2021-05-20 NOTE — Progress Notes (Signed)
? ?Cardiology Office Note  ? ?Date:  05/20/2021  ? ?ID:  Cynthia Bradshaw, DOB 1945/06/11, MRN [id number] ? ?PCP:  Kelton Pillar, MD  ?Cardiologist:  Buford Dresser, MD ? ?Referring MD: Kelton Pillar, MD  ? ?CC: follow up ? ?History of Present Illness:   ? ?Cynthia Bradshaw is a 76 y.o. female with a hx of hypertension, LBBB, nonischemic cardiomyopathy who is seen for follow up. I initially met her 12/22/19 as a new consult at the request of Kelton Pillar, MD for the evaluation and management of preoperative cardiovascular evaluation, LBBB. Echo performed showed reduced EF. CT cardiac without CAD. ? ?Today: ?Blood pressures up slightly on home measurements. Reviewed log today. Range 131/71-145/75. Most mid-130s/70s. Would like to increase valsartan dose, will check BMET today. ? ?Has appt with Dr. Laurann Montana on 5/4, expects that she will be getting blood work. She is ok with another draw at that time if needed. ? ?Denies chest pain, shortness of breath at rest or with normal exertion. No PND, orthopnea, LE edema or unexpected weight gain. No syncope or palpitations.  ? ?Past Medical History:  ?Diagnosis Date  ? Anxiety   ? Arthritis   ? lower back  ? Cardiomyopathy ([place])   ? Constipation   ? Dysrhythmia   ? LBBB  ? GERD (gastroesophageal reflux disease)   ? Headache   ? High cholesterol   ? Hypertension   ? IBS (irritable bowel syndrome)   ? Migraines   ? Seasonal allergies   ? ? ?Past Surgical History:  ?Procedure Laterality Date  ? ABDOMINAL SURGERY    ? ANTERIOR LAT LUMBAR FUSION N/A 02/28/2020  ? Procedure: Lateral Lumbar Interbody Fusion Lumbar two- lumbar three, Lumbar three-four, Lumbar four-five RIGHT;  Surgeon: Vallarie Mare, MD;  Location: [place];  Service: Neurosurgery;  Laterality: N/A;  ? BREAST EXCISIONAL BIOPSY Left   ? COLON SURGERY    ? COLONOSCOPY    ? COLOSTOMY    ? COLOSTOMY REVERSAL    ? LUMBAR PERCUTANEOUS PEDICLE SCREW 3 LEVEL N/A 02/28/2020  ? Procedure: Minimally Invasive  Right Lumbar four-five Laminectomy and Facetectomy with Percutaneous Pedicle Screw Placement Lumbar two-five;  Surgeon: Vallarie Mare, MD;  Location: [place];  Service: Neurosurgery;  Laterality: N/A;  ? ? ?Current Medications: ?Current Outpatient Medications on File Prior to Visit  ?Medication Sig  ? ALPRAZolam (XANAX) 0.25 MG tablet Take 0.25 mg by mouth as needed.  ? atorvastatin (LIPITOR) 20 MG tablet Take 20 mg by mouth daily.  ? cholecalciferol (VITAMIN D) 1000 units tablet Take 1,000 Units by mouth daily.   ? denosumab (PROLIA) 60 MG/ML SOSY injection Inject 60 mg into the skin every 6 (six) months.  ? dicyclomine (BENTYL) 20 MG tablet Take 20 mg by mouth daily.  ? metoprolol succinate (TOPROL XL) 25 MG 24 hr tablet Take 1 tablet (25 mg total) by mouth daily.  ? Multiple Vitamin (MULTIVITAMIN WITH MINERALS) TABS tablet Take 1 tablet by mouth daily.  ? Omega-3 Fatty Acids (FISH OIL ADULT GUMMIES PO) Take by mouth.  ? omeprazole (PRILOSEC) 20 MG capsule Take 20 mg by mouth daily.  ? polyethylene glycol (MIRALAX / GLYCOLAX) 17 g packet Take 17 g by mouth 2 (two) times a week.  ? POTASSIUM PO Take 50 mg by mouth daily as needed (foot cramps).  ? Probiotic Product (DIGESTIVE ADVANTAGE) CAPS Take 1 capsule by mouth daily.  ? rizatriptan (MAXALT) 10 MG tablet Take 10 mg by mouth as needed  for migraine. May repeat in 2 hours if needed  ? vitamin E 180 MG (400 UNITS) capsule Take 400 Units by mouth daily.  ? ?No current facility-administered medications on file prior to visit.  ?  ? ?Allergies:   Patient has no known allergies.  ? ?Social History  ? ?Tobacco Use  ? Smoking status: Never  ? Smokeless tobacco: Never  ?Vaping Use  ? Vaping Use: Never used  ?Substance Use Topics  ? Alcohol use: Yes  ?  Comment: occasional wine  ? Drug use: No  ? ? ?Family History: ?family history is negative for Breast cancer. ? ?ROS:   ?Please see the history of present illness.   ?Additional pertinent ROS otherwise  unremarkable. ? ?EKGs/Labs/Other Studies Reviewed:   ? ?The following studies were reviewed today: ? ?Coronary CTA 05/14/2020: ?FINDINGS: ?A 120 kV prospective scan was triggered in the descending thoracic ?aorta at 111 HU's. Axial non-contrast 3 mm slices were carried out ?through the heart. The data set was analyzed on a dedicated work ?station and scored using the [place]. Gantry rotation speed ?was 250 msecs and collimation was .6 mm. No beta blockade and 0.8 mg ?of sl NTG was given. The 3D data set was reconstructed in 5% ?intervals of the 67-82 % of the R-R cycle. Diastolic phases were ?analyzed on a dedicated work station using MPR, MIP and VRT modes. ?The patient received 80 cc of contrast. ?  ?Aorta: Normal size. Ascending aorta 3.3 cm. No calcifications. No ?dissection. ?  ?Aortic Valve:  Trileaflet.  No calcifications. ?  ?Coronary Arteries:  Normal coronary origin.  Left dominance. ?  ?RCA is a small non-dominant artery that gives rise to PDA and PLVB. ?There is no plaque. ?  ?Left main is a large artery that gives rise to LAD and LCX arteries. ?  ?LAD is a large vessel that has no plaque. There are two small ?diagonal vessels without plaque. ?  ?LCX is a dominant artery that gives rise to two OM branches and ?L-PDA. There is no plaque. ?  ?Coronary Calcium Score: 0 ?  ?Other findings: ?Normal pulmonary vein drainage into the left atrium. ?  ?Normal let atrial appendage without a thrombus. ?  ?Normal size of the pulmonary artery. ?  ?There is a small accessory left atrial appendage. ?  ?Mild mitral annular calcification. ?  ?IMPRESSION: ?1. Coronary calcium score of 0. This was 0 percentile for age-, ?race-, and sex-matched controls. ?2. Normal coronary origin with right dominance. ?3. No evidence of CAD. ? ?Echo 01/16/20 ? 1. Left ventricular ejection fraction, by estimation, is 40 to 45%. The  ?left ventricle has mildly decreased function. The left ventricle has no  ?regional wall motion  abnormalities. Left ventricular diastolic parameters  ?are consistent with Grade I  ?diastolic dysfunction (impaired relaxation).  ? 2. Right ventricular systolic function is normal. The right ventricular  ?size is normal. There is normal pulmonary artery systolic pressure.  ? 3. The mitral valve is normal in structure. No evidence of mitral valve  ?regurgitation. No evidence of mitral stenosis.  ? 4. The aortic valve is normal in structure. Aortic valve regurgitation is  ?mild. Mild to moderate aortic valve sclerosis/calcification is present,  ?without any evidence of aortic stenosis. Aortic regurgitation PHT measures  ?337 msec.  ? 5. The inferior vena cava is normal in size with greater than 50%  ?respiratory variability, suggesting right atrial pressure of 3 mmHg.  ? ?EKG:  EKG is personally reviewed.   ?  08/19/2020: sinus bradycardia at 56 bpm, LBBB with QRS 148 msec ?02/09/2020: EKG was not ordered. ?12/22/19: NSR, LBBB with QRS 140 msec ? ?Recent Labs: ?No results found for requested labs within last 8760 hours.  ?Recent Lipid Panel ?No results found for: CHOL, TRIG, HDL, CHOLHDL, VLDL, LDLCALC, LDLDIRECT ? ?Physical Exam:   ? ?VS:  BP (!) 156/80   Pulse 87   Ht 5' 4.5" (1.638 m)   Wt 121 lb (54.9 kg)   LMP  (LMP Unknown)   SpO2 98%   BMI 20.45 kg/m?    ? ?Wt Readings from Last 3 Encounters:  ?05/20/21 121 lb (54.9 kg)  ?02/20/21 120 lb 12.8 oz (54.8 kg)  ?08/19/20 119 lb 9.6 oz (54.3 kg)  ?  ?GEN: Well nourished, well developed in no acute distress ?HEENT: Normal, moist mucous membranes ?NECK: No JVD ?CARDIAC: regular rhythm, normal S1 and S2, no rubs or gallops. 1/6 systolic murmur. ?VASCULAR: Radial and DP pulses 2+ bilaterally. No carotid bruits ?RESPIRATORY:  Clear to auscultation without rales, wheezing or rhonchi  ?ABDOMEN: Soft, non-tender, non-distended ?MUSCULOSKELETAL:  Ambulates independently ?SKIN: Warm and dry, no edema ?NEUROLOGIC:  Alert and oriented x 3. No focal neuro deficits  noted. ?PSYCHIATRIC:  Normal affect   ? ?ASSESSMENT:   ? ?1. NICM (nonischemic cardiomyopathy) ([place])   ?2. LBBB (left bundle branch block)   ?3. Essential hypertension   ?4. Cardiac risk counseling   ? ? ?PLAN:   ? ?Abnormal Echo, EF 40-45% ?LBBB ?

## 2021-05-21 LAB — BASIC METABOLIC PANEL
BUN/Creatinine Ratio: 15 (ref 12–28)
BUN: 13 mg/dL (ref 8–27)
CO2: 25 mmol/L (ref 20–29)
Calcium: 10.1 mg/dL (ref 8.7–10.3)
Chloride: 95 mmol/L — ABNORMAL LOW (ref 96–106)
Creatinine, Ser: 0.87 mg/dL (ref 0.57–1.00)
Glucose: 91 mg/dL (ref 70–99)
Potassium: 5.1 mmol/L (ref 3.5–5.2)
Sodium: 134 mmol/L (ref 134–144)
eGFR: 69 mL/min/{1.73_m2} (ref >59)

## 2021-05-22 DIAGNOSIS — M1990 Unspecified osteoarthritis, unspecified site: Secondary | ICD-10-CM | POA: Diagnosis not present

## 2021-05-22 DIAGNOSIS — I10 Essential (primary) hypertension: Secondary | ICD-10-CM | POA: Diagnosis not present

## 2021-05-22 DIAGNOSIS — G43909 Migraine, unspecified, not intractable, without status migrainosus: Secondary | ICD-10-CM | POA: Diagnosis not present

## 2021-05-22 DIAGNOSIS — K580 Irritable bowel syndrome with diarrhea: Secondary | ICD-10-CM | POA: Diagnosis not present

## 2021-05-22 DIAGNOSIS — Z0000 Encounter for general adult medical examination without abnormal findings: Secondary | ICD-10-CM | POA: Diagnosis not present

## 2021-05-22 DIAGNOSIS — F39 Unspecified mood [affective] disorder: Secondary | ICD-10-CM | POA: Diagnosis not present

## 2021-05-22 DIAGNOSIS — M810 Age-related osteoporosis without current pathological fracture: Secondary | ICD-10-CM | POA: Diagnosis not present

## 2021-05-22 DIAGNOSIS — J309 Allergic rhinitis, unspecified: Secondary | ICD-10-CM | POA: Diagnosis not present

## 2021-05-22 DIAGNOSIS — E7800 Pure hypercholesterolemia, unspecified: Secondary | ICD-10-CM | POA: Diagnosis not present

## 2021-05-22 DIAGNOSIS — Z980 Intestinal bypass and anastomosis status: Secondary | ICD-10-CM | POA: Diagnosis not present

## 2021-05-22 DIAGNOSIS — Z1331 Encounter for screening for depression: Secondary | ICD-10-CM | POA: Diagnosis not present

## 2021-05-22 DIAGNOSIS — I429 Cardiomyopathy, unspecified: Secondary | ICD-10-CM | POA: Diagnosis not present

## 2021-05-23 ENCOUNTER — Telehealth (HOSPITAL_BASED_OUTPATIENT_CLINIC_OR_DEPARTMENT_OTHER): Payer: Self-pay

## 2021-05-23 DIAGNOSIS — E875 Hyperkalemia: Secondary | ICD-10-CM

## 2021-05-23 NOTE — Telephone Encounter (Addendum)
Results called to patient who verbalizes understanding!  ? ? ? ? ? ?----- Message from Jodelle Red, MD sent at 05/22/2021  8:01 AM EDT ----- ?Potassium is borderline (upper limit of normal). Please make sure you do not take any potassium supplements. We will recheck labs in about 2 weeks to make sure that the increased dose of valsartan doesn't raise the potassium too high. ?

## 2021-07-03 DIAGNOSIS — E875 Hyperkalemia: Secondary | ICD-10-CM | POA: Diagnosis not present

## 2021-07-04 LAB — BASIC METABOLIC PANEL
BUN/Creatinine Ratio: 13 (ref 12–28)
BUN: 10 mg/dL (ref 8–27)
CO2: 19 mmol/L — ABNORMAL LOW (ref 20–29)
Calcium: 9.4 mg/dL (ref 8.7–10.3)
Chloride: 99 mmol/L (ref 96–106)
Creatinine, Ser: 0.76 mg/dL (ref 0.57–1.00)
Glucose: 100 mg/dL — ABNORMAL HIGH (ref 70–99)
Potassium: 3.9 mmol/L (ref 3.5–5.2)
Sodium: 140 mmol/L (ref 134–144)
eGFR: 82 mL/min/{1.73_m2} (ref >59)

## 2021-09-26 DIAGNOSIS — M4316 Spondylolisthesis, lumbar region: Secondary | ICD-10-CM | POA: Diagnosis not present

## 2021-10-27 DIAGNOSIS — M4316 Spondylolisthesis, lumbar region: Secondary | ICD-10-CM | POA: Diagnosis not present

## 2021-10-29 ENCOUNTER — Other Ambulatory Visit: Payer: Self-pay | Admitting: Neurosurgery

## 2021-10-29 DIAGNOSIS — M4316 Spondylolisthesis, lumbar region: Secondary | ICD-10-CM

## 2021-11-24 DIAGNOSIS — M810 Age-related osteoporosis without current pathological fracture: Secondary | ICD-10-CM | POA: Diagnosis not present

## 2021-11-24 DIAGNOSIS — Z23 Encounter for immunization: Secondary | ICD-10-CM | POA: Diagnosis not present

## 2022-04-22 ENCOUNTER — Other Ambulatory Visit: Payer: Self-pay | Admitting: Cardiology

## 2022-04-22 DIAGNOSIS — I429 Cardiomyopathy, unspecified: Secondary | ICD-10-CM

## 2022-04-22 NOTE — Telephone Encounter (Signed)
Rx request sent to pharmacy.  

## 2022-05-27 DIAGNOSIS — K580 Irritable bowel syndrome with diarrhea: Secondary | ICD-10-CM | POA: Diagnosis not present

## 2022-05-27 DIAGNOSIS — I10 Essential (primary) hypertension: Secondary | ICD-10-CM | POA: Diagnosis not present

## 2022-05-27 DIAGNOSIS — G43909 Migraine, unspecified, not intractable, without status migrainosus: Secondary | ICD-10-CM | POA: Diagnosis not present

## 2022-05-27 DIAGNOSIS — F39 Unspecified mood [affective] disorder: Secondary | ICD-10-CM | POA: Diagnosis not present

## 2022-05-27 DIAGNOSIS — K5730 Diverticulosis of large intestine without perforation or abscess without bleeding: Secondary | ICD-10-CM | POA: Diagnosis not present

## 2022-05-27 DIAGNOSIS — I428 Other cardiomyopathies: Secondary | ICD-10-CM | POA: Diagnosis not present

## 2022-05-27 DIAGNOSIS — E7800 Pure hypercholesterolemia, unspecified: Secondary | ICD-10-CM | POA: Diagnosis not present

## 2022-05-27 DIAGNOSIS — J309 Allergic rhinitis, unspecified: Secondary | ICD-10-CM | POA: Diagnosis not present

## 2022-05-27 DIAGNOSIS — K2100 Gastro-esophageal reflux disease with esophagitis, without bleeding: Secondary | ICD-10-CM | POA: Diagnosis not present

## 2022-05-27 DIAGNOSIS — M810 Age-related osteoporosis without current pathological fracture: Secondary | ICD-10-CM | POA: Diagnosis not present

## 2022-05-27 DIAGNOSIS — F411 Generalized anxiety disorder: Secondary | ICD-10-CM | POA: Diagnosis not present

## 2022-05-27 DIAGNOSIS — Z0000 Encounter for general adult medical examination without abnormal findings: Secondary | ICD-10-CM | POA: Diagnosis not present

## 2022-05-28 ENCOUNTER — Other Ambulatory Visit: Payer: Self-pay | Admitting: Internal Medicine

## 2022-05-28 ENCOUNTER — Other Ambulatory Visit: Payer: Self-pay | Admitting: Family Medicine

## 2022-05-28 DIAGNOSIS — M810 Age-related osteoporosis without current pathological fracture: Secondary | ICD-10-CM

## 2022-06-16 ENCOUNTER — Ambulatory Visit (INDEPENDENT_AMBULATORY_CARE_PROVIDER_SITE_OTHER): Payer: Medicare Other | Admitting: Cardiology

## 2022-06-16 ENCOUNTER — Encounter (HOSPITAL_BASED_OUTPATIENT_CLINIC_OR_DEPARTMENT_OTHER): Payer: Self-pay | Admitting: Cardiology

## 2022-06-16 VITALS — BP 172/86 | HR 69 | Ht 64.5 in | Wt 120.6 lb

## 2022-06-16 DIAGNOSIS — I447 Left bundle-branch block, unspecified: Secondary | ICD-10-CM

## 2022-06-16 DIAGNOSIS — I10 Essential (primary) hypertension: Secondary | ICD-10-CM | POA: Diagnosis not present

## 2022-06-16 DIAGNOSIS — Z7189 Other specified counseling: Secondary | ICD-10-CM

## 2022-06-16 DIAGNOSIS — I428 Other cardiomyopathies: Secondary | ICD-10-CM | POA: Diagnosis not present

## 2022-06-16 NOTE — Patient Instructions (Signed)
Medication Instructions:  Your physician recommends that you continue on your current medications as directed. Please refer to the Current Medication list given to you today.  *If you need a refill on your cardiac medications before your next appointment, please call your pharmacy*  Lab Work: NONE  Testing/Procedures: NONE  Follow-Up: At [facility], you and your health needs are our priority.  As part of our continuing mission to provide you with exceptional heart care, we have created designated Provider Care Teams.  These Care Teams include your primary Cardiologist (physician) and Advanced Practice Providers (APPs -  Physician Assistants and Nurse Practitioners) who all work together to provide you with the care you need, when you need it.  We recommend signing up for the patient portal called "MyChart".  Sign up information is provided on this After Visit Summary.  MyChart is used to connect with patients for Virtual Visits (Telemedicine).  Patients are able to view lab/test results, encounter notes, upcoming appointments, etc.  Non-urgent messages can be sent to your provider as well.   To learn more about what you can do with MyChart, go to [email or web address].    Your next appointment:   12 month(s)  The format for your next appointment:   In Person  Provider:   Bridgette Christopher, MD     

## 2022-06-16 NOTE — Progress Notes (Signed)
Cardiology Office Note   Date:  06/16/2022   ID:  Cynthia Bradshaw, Cynthia Bradshaw Jul 31, 1945, MRN [id number]  PCP:  Ollen Bowl, MD  Cardiologist:  Jodelle Red, MD  Referring MD: [name], MD   CC: follow up  History of Present Illness:    Cynthia Bradshaw is a 77 y.o. female with a hx of hypertension, LBBB, nonischemic cardiomyopathy who is seen for follow up. I initially met her 12/22/19 as a new consult at the request of [name], MD for the evaluation and management of preoperative cardiovascular evaluation, LBBB. Echo performed showed reduced EF. CT cardiac without CAD.  At her last visit her blood pressures were up slightly on home measurements. Reviewed log showing a range 131/71-145/75. Most mid-130s/70s. We increased her valsartan dose to 160 mg daily. She had an upcoming appointment with Dr. Valentina Lucks on 5/4, expected that she will be getting blood work.   Today, she is feeling good. She occasionally checks her blood pressure at home which averages in the 130s systolic. In clinic today her blood pressure is elevated to 188-192/90, and improved to 172/86 on recheck. She endorses known white coat hypertension.  Her only physical limitations lately are her chronic back issues, which have improved significantly since her surgery. She is now able to walk to her mailbox and back. She continues to stay active with her gardening, although she is unable to complete as much work as she would like. May become a little short-winded, but not severely.  Additionally she complains of struggling with environmental allergies.  She denies any palpitations, chest pain, or peripheral edema. No lightheadedness, headaches, syncope, orthopnea, or PND.   Past Medical History:  Diagnosis Date   Anxiety    Arthritis    lower back   Cardiomyopathy (HCC)    Constipation    Dysrhythmia    LBBB   GERD (gastroesophageal reflux disease)    Headache    High cholesterol     Hypertension    IBS (irritable bowel syndrome)    Migraines    Seasonal allergies     Past Surgical History:  Procedure Laterality Date   ABDOMINAL SURGERY     ANTERIOR LAT LUMBAR FUSION N/A 02/28/2020   Procedure: Lateral Lumbar Interbody Fusion Lumbar two- lumbar three, Lumbar three-four, Lumbar four-five RIGHT;  Surgeon: Bedelia Person, MD;  Location: [facility] OR;  Service: Neurosurgery;  Laterality: N/A;   BREAST EXCISIONAL BIOPSY Left    COLON SURGERY     COLONOSCOPY     COLOSTOMY     COLOSTOMY REVERSAL     LUMBAR PERCUTANEOUS PEDICLE SCREW 3 LEVEL N/A 02/28/2020   Procedure: Minimally Invasive Right Lumbar four-five Laminectomy and Facetectomy with Percutaneous Pedicle Screw Placement Lumbar two-five;  Surgeon: Bedelia Person, MD;  Location: [facility] OR;  Service: Neurosurgery;  Laterality: N/A;    Current Medications: Current Outpatient Medications on File Prior to Visit  Medication Sig   ALPRAZolam (XANAX) 0.25 MG tablet Take 0.25 mg by mouth as needed.   atorvastatin (LIPITOR) 20 MG tablet Take 20 mg by mouth daily.   cholecalciferol (VITAMIN D) 1000 units tablet Take 1,000 Units by mouth daily.    denosumab (PROLIA) 60 MG/ML SOSY injection Inject 60 mg into the skin every 6 (six) months.   dicyclomine (BENTYL) 20 MG tablet Take 20 mg by mouth daily.   metoprolol succinate (TOPROL-XL) 25 MG 24 hr tablet TAKE ONE TABLET ONCE DAILY   Multiple Vitamin (MULTIVITAMIN WITH MINERALS) TABS tablet  Take 1 tablet by mouth daily.   Omega-3 Fatty Acids (FISH OIL ADULT GUMMIES PO) Take by mouth.   omeprazole (PRILOSEC) 20 MG capsule Take 20 mg by mouth daily.   polyethylene glycol (MIRALAX / GLYCOLAX) 17 g packet Take 17 g by mouth 2 (two) times a week.   POTASSIUM PO Take 50 mg by mouth daily as needed (foot cramps).   Probiotic Product (DIGESTIVE ADVANTAGE) CAPS Take 1 capsule by mouth daily.   rizatriptan (MAXALT) 10 MG tablet Take 10 mg by mouth as needed for migraine. May repeat in 2 hours  if needed   valsartan (DIOVAN) 160 MG tablet Take 1 tablet (160 mg total) by mouth daily.   vitamin E 180 MG (400 UNITS) capsule Take 400 Units by mouth daily.   No current facility-administered medications on file prior to visit.     Allergies:   Patient has no known allergies.   Social History   Tobacco Use   Smoking status: Never   Smokeless tobacco: Never  Vaping Use   Vaping Use: Never used  Substance Use Topics   Alcohol use: Yes    Comment: occasional wine   Drug use: No    Family History: family history is negative for Breast cancer.  ROS:   Please see the history of present illness.   (+) Mild shortness of breath (+) Environmental allergies Additional pertinent ROS otherwise unremarkable.  EKGs/Labs/Other Studies Reviewed:    The following studies were reviewed today:  Coronary CTA 05/14/2020: FINDINGS: A 120 kV prospective scan was triggered in the descending thoracic aorta at 111 HU's. Axial non-contrast 3 mm slices were carried out through the heart. The data set was analyzed on a dedicated work station and scored using the Agatson method. Gantry rotation speed was 250 msecs and collimation was .6 mm. No beta blockade and 0.8 mg of sl NTG was given. The 3D data set was reconstructed in 5% intervals of the 67-82 % of the R-R cycle. Diastolic phases were analyzed on a dedicated work station using MPR, MIP and VRT modes. The patient received 80 cc of contrast.   Aorta: Normal size. Ascending aorta 3.3 cm. No calcifications. No dissection.   Aortic Valve:  Trileaflet.  No calcifications.   Coronary Arteries:  Normal coronary origin.  Left dominance.   RCA is a small non-dominant artery that gives rise to PDA and PLVB. There is no plaque.   Left main is a large artery that gives rise to LAD and LCX arteries.   LAD is a large vessel that has no plaque. There are two small diagonal vessels without plaque.   LCX is a dominant artery that gives rise to  two OM branches and L-PDA. There is no plaque.   Coronary Calcium Score: 0   Other findings: Normal pulmonary vein drainage into the left atrium.   Normal let atrial appendage without a thrombus.   Normal size of the pulmonary artery.   There is a small accessory left atrial appendage.   Mild mitral annular calcification.   IMPRESSION: 1. Coronary calcium score of 0. This was 0 percentile for age-, race-, and sex-matched controls. 2. Normal coronary origin with right dominance. 3. No evidence of CAD.  Echo 01/16/20  1. Left ventricular ejection fraction, by estimation, is 40 to 45%. The  left ventricle has mildly decreased function. The left ventricle has no  regional wall motion abnormalities. Left ventricular diastolic parameters  are consistent with Grade I  diastolic dysfunction (impaired  relaxation).   2. Right ventricular systolic function is normal. The right ventricular  size is normal. There is normal pulmonary artery systolic pressure.   3. The mitral valve is normal in structure. No evidence of mitral valve  regurgitation. No evidence of mitral stenosis.   4. The aortic valve is normal in structure. Aortic valve regurgitation is  mild. Mild to moderate aortic valve sclerosis/calcification is present,  without any evidence of aortic stenosis. Aortic regurgitation PHT measures  337 msec.   5. The inferior vena cava is normal in size with greater than 50%  respiratory variability, suggesting right atrial pressure of 3 mmHg.   EKG:  EKG is personally reviewed.   06/16/2022:  NSR, LBBB at 69 bpm 08/19/2020: sinus bradycardia at 56 bpm, LBBB with QRS 148 msec 02/09/2020: EKG was not ordered. 12/22/19: NSR, LBBB with QRS 140 msec  Recent Labs: 07/03/2021: BUN 10; Creatinine, Ser 0.76; Potassium 3.9; Sodium 140   Recent Lipid Panel No results found for: "CHOL", "TRIG", "HDL", "CHOLHDL", "VLDL", "LDLCALC", "LDLDIRECT"  Physical Exam:    VS:  BP (!) 172/86 (BP  Location: Right Arm, Patient Position: Sitting, Cuff Size: Normal)   Pulse 69   Ht 5' 4.5" (1.638 m)   Wt 120 lb 9.6 oz (54.7 kg)   LMP  (LMP Unknown)   BMI 20.38 kg/m     Wt Readings from Last 3 Encounters:  06/16/22 120 lb 9.6 oz (54.7 kg)  05/20/21 121 lb (54.9 kg)  02/20/21 120 lb 12.8 oz (54.8 kg)    GEN: Well nourished, well developed in no acute distress HEENT: Normal, moist mucous membranes NECK: No JVD CARDIAC: regular rhythm, normal S1 and S2, no rubs or gallops. 1/6 systolic murmur. VASCULAR: Radial and DP pulses 2+ bilaterally. No carotid bruits RESPIRATORY:  Clear to auscultation without rales, wheezing or rhonchi  ABDOMEN: Soft, non-tender, non-distended MUSCULOSKELETAL:  Ambulates independently SKIN: Warm and dry, no edema NEUROLOGIC:  Alert and oriented x 3. No focal neuro deficits noted. PSYCHIATRIC:  Normal affect    ASSESSMENT:    1. NICM (nonischemic cardiomyopathy) (HCC)   2. LBBB (left bundle branch block)   3. Essential hypertension   4. White coat syndrome with diagnosis of hypertension   5. Cardiac risk counseling     PLAN:    Abnormal Echo, EF 40-45% LBBB Cardiomyopathy, nonischemic -reviewed her ECG, echo, CT cardiac -continue metoprolol succinate, valsartan -we have previously discussed entresto -NYHA class I and therefore does not meet criteria for CRT. We did discuss symptoms to monitor for  -we have discussed repeat echo, but given that she is asymptomatic and would not change management, will hold on repeating at this time.  Hypertension: -BP above goal today, well controlled at home. Has known white coat issues with hypertension -she will check BP at home and let me know if consistently above goal <130/80  Cardiac risk counseling and prevention recommendations: -recommend heart healthy/Mediterranean diet, with whole grains, fruits, vegetable, fish, lean meats, nuts, and olive oil. Limit salt. -recommend moderate walking, 3-5  times/week for 30-50 minutes each session. Aim for at least 150 minutes.week. Goal should be pace of 3 miles/hours, or walking 1.5 miles in 30 minutes -recommend avoidance of tobacco products. Avoid excess alcohol.  Plan for follow up: 1 year or sooner as needed.  Jodelle Red, MD, PhD, [facility] [place]  [facility]    Medication Adjustments/Labs and Tests Ordered: Current medicines are reviewed at length with the patient today.  Concerns regarding  medicines are outlined above.   Orders Placed This Encounter  Procedures   EKG 12-Lead   No orders of the defined types were placed in this encounter.  Patient Instructions  Medication Instructions:  Your physician recommends that you continue on your current medications as directed. Please refer to the Current Medication list given to you today.  *If you need a refill on your cardiac medications before your next appointment, please call your pharmacy*  Lab Work: NONE  Testing/Procedures: NONE  Follow-Up: At [facility], you and your health needs are our priority.  As part of our continuing mission to provide you with exceptional heart care, we have created designated Provider Care Teams.  These Care Teams include your primary Cardiologist (physician) and Advanced Practice Providers (APPs -  Physician Assistants and Nurse Practitioners) who all work together to provide you with the care you need, when you need it.  We recommend signing up for the patient portal called "MyChart".  Sign up information is provided on this After Visit Summary.  MyChart is used to connect with patients for Virtual Visits (Telemedicine).  Patients are able to view lab/test results, encounter notes, upcoming appointments, etc.  Non-urgent messages can be sent to your provider as well.   To learn more about what you can do with MyChart, go to [email or web address].    Your next appointment:   12 month(s)  The format for your next  appointment:   In Person  Provider:   Jodelle Red, MD       [facility] Stumpf,acting as a scribe for Jodelle Red, MD.,have documented all relevant documentation on the behalf of Jodelle Red, MD,as directed by  Jodelle Red, MD while in the presence of Jodelle Red, MD.  I, Jodelle Red, MD, have reviewed all documentation for this visit. The documentation on 07/23/22 for the exam, diagnosis, procedures, and orders are all accurate and complete.   Signed, Jodelle Red, MD PhD 06/16/2022  [facility]

## 2022-06-22 ENCOUNTER — Other Ambulatory Visit (HOSPITAL_BASED_OUTPATIENT_CLINIC_OR_DEPARTMENT_OTHER): Payer: Self-pay | Admitting: Cardiology

## 2022-06-22 DIAGNOSIS — I428 Other cardiomyopathies: Secondary | ICD-10-CM

## 2022-06-22 NOTE — Telephone Encounter (Signed)
Rx request sent to pharmacy.  

## 2022-07-23 ENCOUNTER — Encounter (HOSPITAL_BASED_OUTPATIENT_CLINIC_OR_DEPARTMENT_OTHER): Payer: Self-pay | Admitting: Cardiology

## 2022-09-16 IMAGING — CT CT L SPINE W/O CM
1 of 6 series · 6 of 14 positions shown, 8 images · non-contrast
Comparison: [REDACTED] Lumbar MRI
05/01/2019. CT Abdomen and Pelvis 09/27/2015.

CLINICAL DATA: 73-year-old female with scoliosis.

EXAM:
CT LUMBAR SPINE WITHOUT CONTRAST
TECHNIQUE: Multidetector CT imaging of the lumbar spine was performed without
intravenous contrast administration. Multiplanar CT image
reconstructions were also generated.

[Series 3: l spine soft · axial · 0.34mm/px · z∈[-239,-77]mm · 6 of 76 slices shown, 8 images]
[im 11/76  soft-tissue]
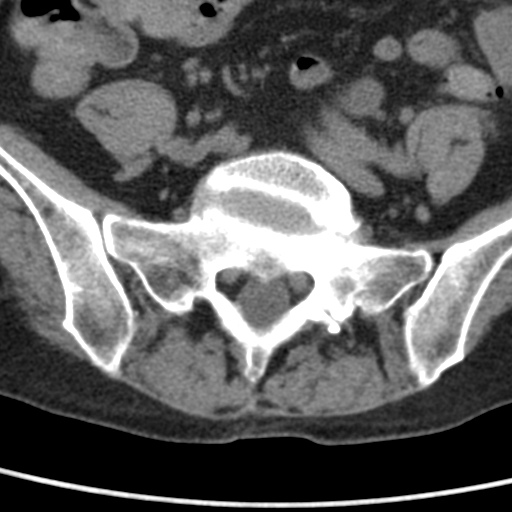
[im 11/76  bone]
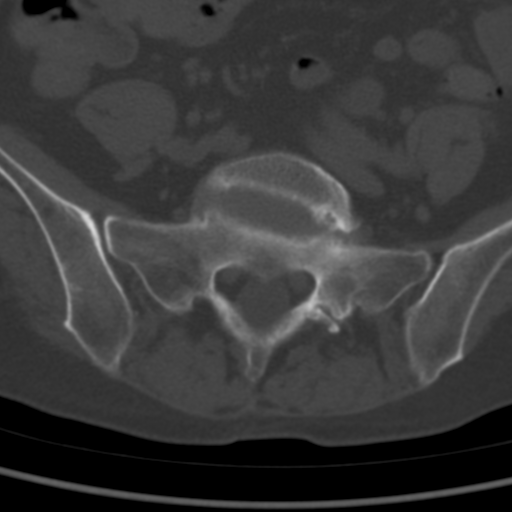
[im 22/76  bone]
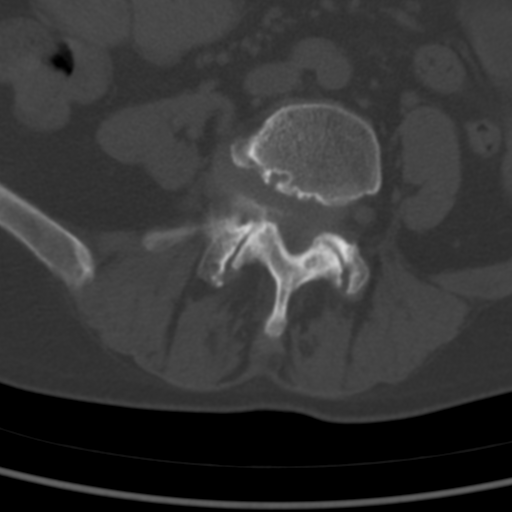
[im 33/76  bone]
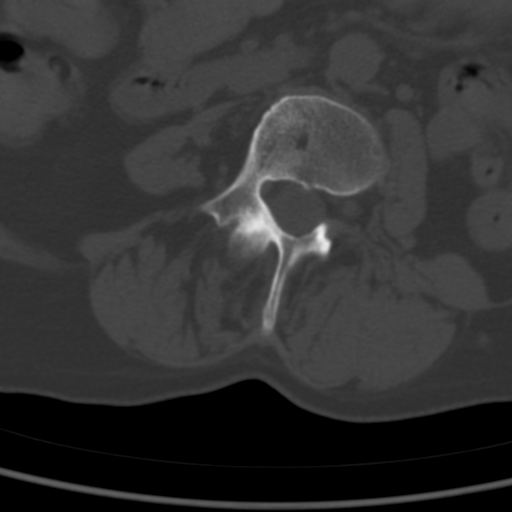
[im 43/76  bone]
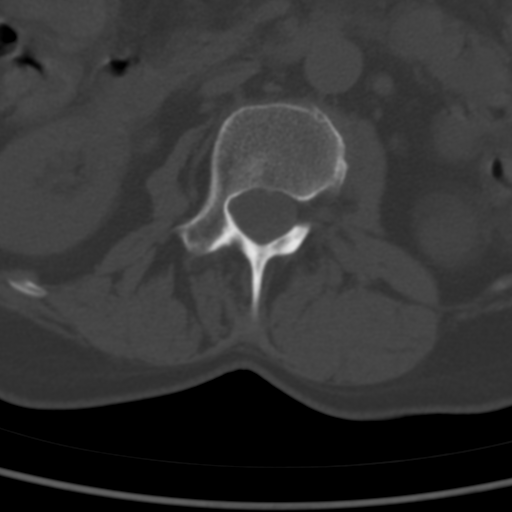
[im 54/76  soft-tissue]
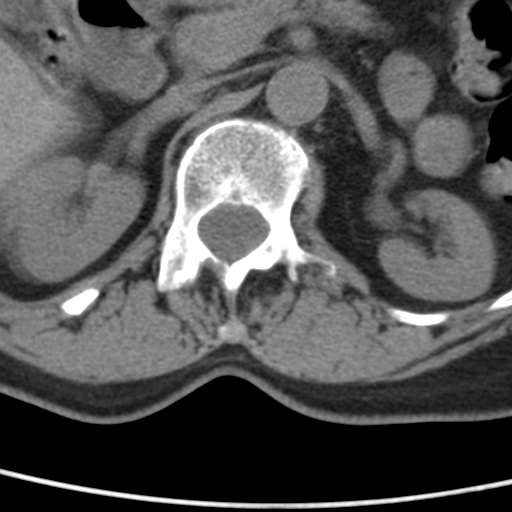
[im 54/76  bone]
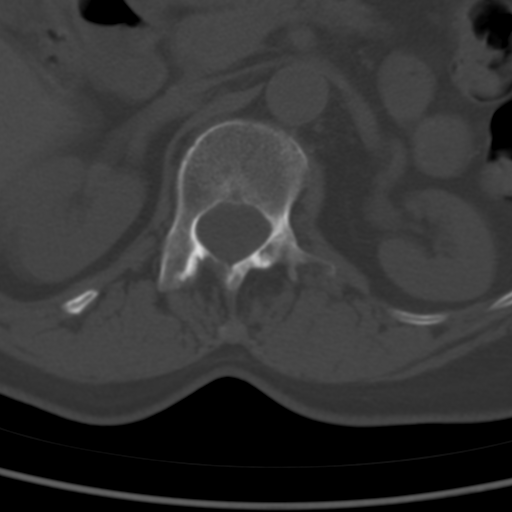
[im 65/76  bone]
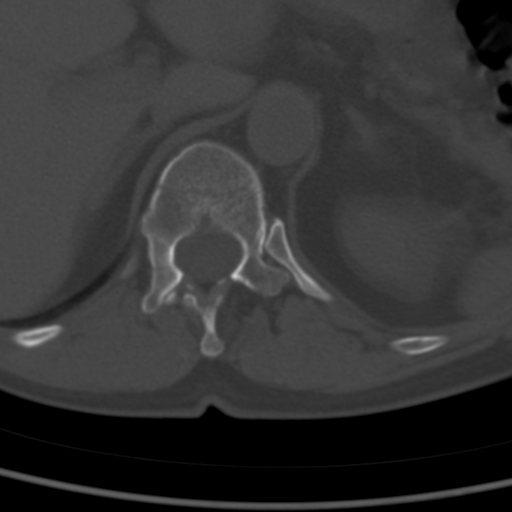

[6 of 14 positions shown; findings below may reference images not displayed]

FINDINGS: Segmentation: Normal, the same numbering system used on the prior
MRI.

Alignment: Stable since [DATE] anterolisthesis of L4 on L5,
up to 6 mm, with moderate underlying levoconvex lumbar scoliosis
apex at L3-L4 (series 7, image 20).

Vertebrae: No acute osseous abnormality identified. Intact visible
sacrum and SI joints. Background bone mineralization is within
normal limits.

Paraspinal and other soft tissues: Small chronic surgical clips at
the left lower gonadal vein. Otherwise negative.

Disc levels: Capacious lower thoracic and lumbar spinal canal above
L4. Advanced lower lumbar degeneration:

L3-L4: Severe right lateral disc space loss with right eccentric
circumferential disc osteophyte complex. Dominant right far lateral
component. Mild to moderate facet hypertrophy. Moderate right L3
foraminal stenosis, stable.

L4-L5: Anterolisthesis with disc space loss and vacuum disc. Bulky
circumferential disc bulge/pseudo disc eccentric to the right. Bulky
severe facet hypertrophy greater on the right. Severe spinal and
right lateral recess stenosis. Moderate to severe left lateral
recess stenosis. Severe right L4 foraminal stenosis. This level is
stable.

L5-S1: Circumferential disc bulge and mild to moderate facet and
ligament flavum hypertrophy greater on the left. Mild to moderate
left L5 foraminal stenosis is stable.
IMPRESSION: 1. No acute osseous abnormality and stable advanced lower lumbar
degeneration from the [DATE]. Levoconvex scoliosis with spondylolisthesis at L4-L5 and
subsequent severe spinal, right lateral recess, and right foraminal
stenosis at that level. Query Right L4 and/or L5 radiculitis.

3. Moderate right L3 and moderate to severe left L5 foraminal
stenosis.

## 2022-10-05 DIAGNOSIS — M4326 Fusion of spine, lumbar region: Secondary | ICD-10-CM | POA: Diagnosis not present

## 2022-10-05 DIAGNOSIS — M47816 Spondylosis without myelopathy or radiculopathy, lumbar region: Secondary | ICD-10-CM | POA: Diagnosis not present

## 2022-10-08 ENCOUNTER — Telehealth (HOSPITAL_BASED_OUTPATIENT_CLINIC_OR_DEPARTMENT_OTHER): Payer: Self-pay | Admitting: Cardiology

## 2022-10-08 NOTE — Telephone Encounter (Signed)
Pt c/o medication issue:  1. Name of Medication:   atorvastatin (LIPITOR) 20 MG tablet  valsartan (DIOVAN) 160 MG tablet  metoprolol succinate (TOPROL-XL) 25 MG 24 hr tablet   2. How are you currently taking this medication (dosage and times per day)?  As prescribed  3. Are you having a reaction (difficulty breathing--STAT)?   NO  4. What is your medication issue?   Patient stated she has been having BP readings in the 150's and wants to know if her medication needs to be tweaked.

## 2022-10-08 NOTE — Telephone Encounter (Signed)
Spoke with patient regarding blood pressure readings Takes her Metoprolol and Valsartan every morning around 10-11 am  Has 3 machines and they are all giving her same readings SBP consistently running in the 150's, taking readings morning and evening   Will forward to Dr Cristal Deer, covering at hospital this week and Pharm D for review

## 2022-10-09 MED ORDER — CARVEDILOL 12.5 MG PO TABS: 12.5000 mg | ORAL_TABLET | Freq: Two times a day (BID) | ORAL | 3 refills | Status: DC

## 2022-10-09 NOTE — Telephone Encounter (Signed)
Spoke to patient, home BP readings 145/78,150/75,152/78,153/85,154/82. Patient states she does not keep track of heart rate. Denies dizziness, headache, SOB, palpitation. Takes and tolerates current BP medications well. Patient is in agreement to change metoprolol XL 25 mg daily to carvedilol   Carvedilol 12.5 mg twice daily prescription sent to [facility].  Patient verbalize understanding to d/c metoprolol, start carvedilol and continue taking Diovan 160 mg daily

## 2022-10-15 DIAGNOSIS — M4326 Fusion of spine, lumbar region: Secondary | ICD-10-CM | POA: Diagnosis not present

## 2022-10-23 DIAGNOSIS — M4317 Spondylolisthesis, lumbosacral region: Secondary | ICD-10-CM | POA: Diagnosis not present

## 2022-10-26 ENCOUNTER — Other Ambulatory Visit: Payer: Self-pay | Admitting: Neurosurgery

## 2022-10-26 DIAGNOSIS — M4317 Spondylolisthesis, lumbosacral region: Secondary | ICD-10-CM

## 2022-10-27 ENCOUNTER — Telehealth (HOSPITAL_BASED_OUTPATIENT_CLINIC_OR_DEPARTMENT_OTHER): Payer: Self-pay | Admitting: Cardiology

## 2022-10-27 NOTE — Telephone Encounter (Signed)
  Pt is requesting to speak with [facility]. She would like to follow up heer BP and her Carvedilol

## 2022-10-27 NOTE — Telephone Encounter (Signed)
Spoke to patient, Low 119/67 heart rate 66 High 163/91 heart rate 71 In last 2 weeks Last 1 week BP readings   SBP DBP HR  129 76 70  134 78 67  123 63 68  119 67 66  128 76 67  147 74 63  134 79 63  130.5714 73.28571 66.28571   Tolerates carvedilol well. Advised patient to continue with current BP medications - carvedilol 12.5 mg twice daily and Diovan 160 mg daily

## 2022-10-29 ENCOUNTER — Ambulatory Visit
Admission: RE | Admit: 2022-10-29 | Discharge: 2022-10-29 | Disposition: A | Discharge status: Home / Self Care | Payer: Medicare Other | Source: Ambulatory Visit | Attending: Neurosurgery | Admitting: Neurosurgery

## 2022-10-29 DIAGNOSIS — M4317 Spondylolisthesis, lumbosacral region: Secondary | ICD-10-CM

## 2022-11-04 ENCOUNTER — Ambulatory Visit: Payer: Medicare Other | Attending: Neurosurgery

## 2022-11-04 ENCOUNTER — Other Ambulatory Visit: Payer: Self-pay

## 2022-11-04 DIAGNOSIS — M5459 Other low back pain: Secondary | ICD-10-CM | POA: Diagnosis not present

## 2022-11-04 DIAGNOSIS — M6281 Muscle weakness (generalized): Secondary | ICD-10-CM | POA: Insufficient documentation

## 2022-11-04 NOTE — Therapy (Signed)
OUTPATIENT PHYSICAL THERAPY THORACOLUMBAR EVALUATION   Patient Name: Cynthia Bradshaw MRN: [id number] DOB:1945-05-20, 77 y.o., female Today's Date: 11/04/2022  END OF SESSION:  PT End of Session - 11/04/22 1301     Visit Number 1    Number of Visits 8    Date for PT Re-Evaluation 01/15/23    PT Start Time 1300    PT Stop Time 1340    PT Time Calculation (min) 40 min    Activity Tolerance Patient tolerated treatment well    Behavior During Therapy WFL for tasks assessed/performed             Past Medical History:  Diagnosis Date   Anxiety    Arthritis    lower back   Cardiomyopathy (HCC)    Constipation    Dysrhythmia    LBBB   GERD (gastroesophageal reflux disease)    Headache    High cholesterol    Hypertension    IBS (irritable bowel syndrome)    Migraines    Seasonal allergies    Past Surgical History:  Procedure Laterality Date   ABDOMINAL SURGERY     ANTERIOR LAT LUMBAR FUSION N/A 02/28/2020   Procedure: Lateral Lumbar Interbody Fusion Lumbar two- lumbar three, Lumbar three-four, Lumbar four-five RIGHT;  Surgeon: Bedelia Person, MD;  Location: [facility] OR;  Service: Neurosurgery;  Laterality: N/A;   BREAST EXCISIONAL BIOPSY Left    COLON SURGERY     COLONOSCOPY     COLOSTOMY     COLOSTOMY REVERSAL     LUMBAR PERCUTANEOUS PEDICLE SCREW 3 LEVEL N/A 02/28/2020   Procedure: Minimally Invasive Right Lumbar four-five Laminectomy and Facetectomy with Percutaneous Pedicle Screw Placement Lumbar two-five;  Surgeon: Bedelia Person, MD;  Location: [facility] OR;  Service: Neurosurgery;  Laterality: N/A;   Patient Active Problem List   Diagnosis Date Noted   Lumbar radiculopathy 02/28/2020   Protein-calorie malnutrition, severe 09/17/2015   Hyponatremia 09/16/2015   Volume depletion 09/16/2015   IBS (irritable bowel syndrome) 09/16/2015   REFERRING PROVIDER: Bedelia Person, MD   REFERRING DIAG: Spondylolisthesis, lumbosacral region   Rationale for  Evaluation and Treatment: Rehabilitation  THERAPY DIAG:  Other low back pain  Muscle weakness (generalized)  ONSET DATE: chronic, but has been getting worse  SUBJECTIVE:                                                                                                                                                                                           SUBJECTIVE STATEMENT: Patient reports that her back has been bothering her for a while now. She had surgery on her low back  in 2021 which really helped, but she has a vertebrae below her surgery which has started to slip some. She notes that her pain has been getting progressively worse. She has tried some core exercises which has helped some. She has noticed that her pain gets worse throughout the day.   PERTINENT HISTORY:  Anxiety, arthritis, hypertension, and allergies  PAIN:  Are you having pain? Yes: NPRS scale: 6/10 Pain location: low back into her right hip  Pain description: deep ache "like a toothache"" Aggravating factors: movement, standing Relieving factors: sitting and wearing a back brace  PRECAUTIONS: None  RED FLAGS: None   WEIGHT BEARING RESTRICTIONS: No  FALLS:  Has patient fallen in last 6 months? No  LIVING ENVIRONMENT: Lives with: lives with their spouse Lives in: House/apartment Stairs: Yes: Internal: 3-20 steps; on left going up; reciprocal pattern  Has following equipment at home: None  OCCUPATION: retired   PLOF: Independent  PATIENT GOALS: be able to walk to her mailbox, reduced pain, be able to garden, be able to return to yoga, and improved core strength   NEXT MD VISIT: 11/09/22  OBJECTIVE:  Note: Objective measures were completed at Evaluation unless otherwise noted.  DIAGNOSTIC FINDINGS: CT Lumbar spine No results released at this time  PATIENT SURVEYS:  FOTO 49.07  SCREENING FOR RED FLAGS: Bowel or bladder incontinence: No Spinal tumors: No Cauda equina syndrome:  No Compression fracture: No Abdominal aneurysm: No  COGNITION: Overall cognitive status: Within functional limits for tasks assessed     SENSATION: Patient reports numbness in her right leg since her lumbar surgery in 2021.  POSTURE: forward head  PALPATION: TTP: right lumbar paraspinals, QL, and piriformis  JOINT MOBILITY:   Lumbar: hypomobile throughout with pain at L4-5   LUMBAR ROM:   AROM eval  Flexion 76  Extension 14  Right lateral flexion 25% limited; right hip pain   Left lateral flexion 25% limited  Right rotation 25% limited   Left rotation 25% limited   (Blank rows = not tested)  LOWER EXTREMITY ROM: WFL for activities assessed  LOWER EXTREMITY MMT:    MMT Right eval Left eval  Hip flexion 3+/5 4-/5  Hip extension    Hip abduction    Hip adduction    Hip internal rotation    Hip external rotation    Knee flexion 3+/5 3+/5  Knee extension 5/5 4+/5  Ankle dorsiflexion 3+/5 3+/5  Ankle plantarflexion    Ankle inversion    Ankle eversion     (Blank rows = not tested)  GAIT:  Assistive device utilized: None Level of assistance: Complete Independence Comments: no significant gait deviations observed  TODAY'S TREATMENT:                                                                                                                              DATE:     PATIENT EDUCATION:  Education details: plan of care, healing, prognosis, CT  scan, and goals for therapy  Person educated: Patient Education method: Explanation Education comprehension: verbalized understanding  HOME EXERCISE PROGRAM:   ASSESSMENT:  CLINICAL IMPRESSION: Patient is a 77 y.o. female who was seen today for physical therapy evaluation and treatment for chronic low back pain.  She presented with moderate pain severity and irritability with palpation to her right lumbar and hip musculature reproducing her familiar pain. Recommend that she continue with skilled physical therapy to  address her remaining impairments to return to her prior level of function.   OBJECTIVE IMPAIRMENTS: decreased activity tolerance, decreased mobility, decreased strength, hypomobility, impaired sensation, impaired tone, and pain.   ACTIVITY LIMITATIONS: lifting, standing, and locomotion level  PARTICIPATION LIMITATIONS: cleaning, shopping, community activity, and yard work  PERSONAL FACTORS: Past/current experiences, Time since onset of injury/illness/exacerbation, and 3+ comorbidities: Anxiety, arthritis, hypertension, and allergies  are also affecting patient's functional outcome.   REHAB POTENTIAL: Fair    CLINICAL DECISION MAKING: Evolving/moderate complexity  EVALUATION COMPLEXITY: Moderate   GOALS: Goals reviewed with patient? Yes  LONG TERM GOALS: Target date: 12/02/22  Patient will be independent with her HEP. Baseline:  Goal status: INITIAL  2.  Patient will report being able to walk to her mailbox without being limited by her familiar symptoms. Baseline:  Goal status: INITIAL  3.  Patient will be able to complete her daily activities without her familiar pain exceeding 4/10. Baseline:  Goal status: INITIAL  4.  Patient will improve her right hip flexor strength to at least 4-/5 for improved function with her daily activities. Baseline:  Goal status: INITIAL  5.  Patient will be able to demonstrate proper lifting mechanics for improved function with her yard work. Baseline:  Goal status: INITIAL  PLAN:  PT FREQUENCY: 1-2x/week  PT DURATION: 4 weeks  PLANNED INTERVENTIONS: 97164- PT Re-evaluation, 97110-Therapeutic exercises, 97530- Therapeutic activity, [id number]- Neuromuscular re-education, 97535- Self Care, 16109- Manual therapy, 97014- Electrical stimulation (unattended), 97035- Ultrasound, 60454- Traction (mechanical), Patient/Family education, Balance training, Dry Needling, Joint mobilization, Spinal mobilization, Cryotherapy, and Moist heat.  PLAN FOR  NEXT SESSION: NuStep, lumbar and core strengthening, and modalities as needed    Granville Lewis, PT 11/04/2022, 4:16 PM

## 2022-11-09 DIAGNOSIS — M4317 Spondylolisthesis, lumbosacral region: Secondary | ICD-10-CM | POA: Diagnosis not present

## 2022-11-09 DIAGNOSIS — M4326 Fusion of spine, lumbar region: Secondary | ICD-10-CM | POA: Diagnosis not present

## 2022-11-10 ENCOUNTER — Other Ambulatory Visit: Payer: Medicare Other

## 2022-11-12 ENCOUNTER — Ambulatory Visit: Payer: Medicare Other

## 2022-11-12 DIAGNOSIS — M6281 Muscle weakness (generalized): Secondary | ICD-10-CM

## 2022-11-12 DIAGNOSIS — M5459 Other low back pain: Secondary | ICD-10-CM

## 2022-11-12 NOTE — Therapy (Signed)
OUTPATIENT PHYSICAL THERAPY THORACOLUMBAR TREATMENT   Patient Name: Cynthia Bradshaw MRN: [id number] DOB:30-Mar-1945, 77 y.o., female Today's Date: 11/12/2022  END OF SESSION:  PT End of Session - 11/12/22 1317     Visit Number 2    Number of Visits 8    Date for PT Re-Evaluation 01/15/23    PT Start Time 1318    PT Stop Time 1400    PT Time Calculation (min) 42 min    Activity Tolerance Patient tolerated treatment well    Behavior During Therapy WFL for tasks assessed/performed              Past Medical History:  Diagnosis Date   Anxiety    Arthritis    lower back   Cardiomyopathy (HCC)    Constipation    Dysrhythmia    LBBB   GERD (gastroesophageal reflux disease)    Headache    High cholesterol    Hypertension    IBS (irritable bowel syndrome)    Migraines    Seasonal allergies    Past Surgical History:  Procedure Laterality Date   ABDOMINAL SURGERY     ANTERIOR LAT LUMBAR FUSION N/A 02/28/2020   Procedure: Lateral Lumbar Interbody Fusion Lumbar two- lumbar three, Lumbar three-four, Lumbar four-five RIGHT;  Surgeon: Bedelia Person, MD;  Location: [facility] OR;  Service: Neurosurgery;  Laterality: N/A;   BREAST EXCISIONAL BIOPSY Left    COLON SURGERY     COLONOSCOPY     COLOSTOMY     COLOSTOMY REVERSAL     LUMBAR PERCUTANEOUS PEDICLE SCREW 3 LEVEL N/A 02/28/2020   Procedure: Minimally Invasive Right Lumbar four-five Laminectomy and Facetectomy with Percutaneous Pedicle Screw Placement Lumbar two-five;  Surgeon: Bedelia Person, MD;  Location: [facility] OR;  Service: Neurosurgery;  Laterality: N/A;   Patient Active Problem List   Diagnosis Date Noted   Lumbar radiculopathy 02/28/2020   Protein-calorie malnutrition, severe 09/17/2015   Hyponatremia 09/16/2015   Volume depletion 09/16/2015   IBS (irritable bowel syndrome) 09/16/2015   REFERRING PROVIDER: Bedelia Person, MD   REFERRING DIAG: Spondylolisthesis, lumbosacral region   Rationale for  Evaluation and Treatment: Rehabilitation  THERAPY DIAG:  Other low back pain  Muscle weakness (generalized)  ONSET DATE: chronic, but has been getting worse  SUBJECTIVE:                                                                                                                                                                                           SUBJECTIVE STATEMENT: Patient reports that she is not hurting right now, but she doesn't know what will bother it. She would  like to modify her plan of care to once per week.   PERTINENT HISTORY:  Anxiety, arthritis, hypertension, and allergies  PAIN:  Are you having pain? Yes: NPRS scale: 0/10 Pain location: low back into her right hip  Pain description: deep ache "like a toothache"" Aggravating factors: movement, standing Relieving factors: sitting and wearing a back brace  PRECAUTIONS: None  RED FLAGS: None   WEIGHT BEARING RESTRICTIONS: No  FALLS:  Has patient fallen in last 6 months? No  LIVING ENVIRONMENT: Lives with: lives with their spouse Lives in: House/apartment Stairs: Yes: Internal: 3-20 steps; on left going up; reciprocal pattern  Has following equipment at home: None  OCCUPATION: retired   PLOF: Independent  PATIENT GOALS: be able to walk to her mailbox, reduced pain, be able to garden, be able to return to yoga, and improved core strength   NEXT MD VISIT: 11/09/22  OBJECTIVE:  Note: Objective measures were completed at Evaluation unless otherwise noted.  DIAGNOSTIC FINDINGS: CT Lumbar spine No results released at this time  PATIENT SURVEYS:  FOTO 49.07  SCREENING FOR RED FLAGS: Bowel or bladder incontinence: No Spinal tumors: No Cauda equina syndrome: No Compression fracture: No Abdominal aneurysm: No  COGNITION: Overall cognitive status: Within functional limits for tasks assessed     SENSATION: Patient reports numbness in her right leg since her lumbar surgery in 2021.  POSTURE:  forward head  PALPATION: TTP: right lumbar paraspinals, QL, and piriformis  JOINT MOBILITY:   Lumbar: hypomobile throughout with pain at L4-5   LUMBAR ROM:   AROM eval  Flexion 76  Extension 14  Right lateral flexion 25% limited; right hip pain   Left lateral flexion 25% limited  Right rotation 25% limited   Left rotation 25% limited   (Blank rows = not tested)  LOWER EXTREMITY ROM: WFL for activities assessed  LOWER EXTREMITY MMT:    MMT Right eval Left eval  Hip flexion 3+/5 4-/5  Hip extension    Hip abduction    Hip adduction    Hip internal rotation    Hip external rotation    Knee flexion 3+/5 3+/5  Knee extension 5/5 4+/5  Ankle dorsiflexion 3+/5 3+/5  Ankle plantarflexion    Ankle inversion    Ankle eversion     (Blank rows = not tested)  GAIT:  Assistive device utilized: None Level of assistance: Complete Independence Comments: no significant gait deviations observed  TODAY'S TREATMENT:                                                                                                                              DATE:                                     11/12/22 EXERCISE LOG  Exercise Repetitions and Resistance Comments  Nustep  L3-4 x 13 minutes  Isometric ball press 3 minutes w/ 5 second hold    Ball roll out  3 minutes   Seated hip ADD isometric  3 minutes w/ 5 second hold    Eccentric heel tap 6" step x 30 reps each  BUE support   Blank cell = exercise not performed today   PATIENT EDUCATION:  Education details: plan of care, HEP, and activity modification Person educated: Patient Education method: Explanation Education comprehension: verbalized understanding  HOME EXERCISE PROGRAM: [id number]  ASSESSMENT:  CLINICAL IMPRESSION: Patient was introduced to multiple new interventions for improved lumbar and lower extremity strengthening needed for improved function with her daily activities. She required minimal cueing with today's  interventions for proper exercise performance. She was provided an updated HEP and she was able to properly demonstrate these interventions. She reported feeling comfortable with these interventions. She experienced no increase in pain or discomfort with any of today's interventions. She reported feeling tired upon the conclusion of treatment. She continues to require skilled physical therapy to address her remaining impairments to return to her prior level of function.   OBJECTIVE IMPAIRMENTS: decreased activity tolerance, decreased mobility, decreased strength, hypomobility, impaired sensation, impaired tone, and pain.   ACTIVITY LIMITATIONS: lifting, standing, and locomotion level  PARTICIPATION LIMITATIONS: cleaning, shopping, community activity, and yard work  PERSONAL FACTORS: Past/current experiences, Time since onset of injury/illness/exacerbation, and 3+ comorbidities: Anxiety, arthritis, hypertension, and allergies  are also affecting patient's functional outcome.   REHAB POTENTIAL: Fair    CLINICAL DECISION MAKING: Evolving/moderate complexity  EVALUATION COMPLEXITY: Moderate   GOALS: Goals reviewed with patient? Yes  LONG TERM GOALS: Target date: 12/02/22  Patient will be independent with her HEP. Baseline:  Goal status: INITIAL  2.  Patient will report being able to walk to her mailbox without being limited by her familiar symptoms. Baseline:  Goal status: INITIAL  3.  Patient will be able to complete her daily activities without her familiar pain exceeding 4/10. Baseline:  Goal status: INITIAL  4.  Patient will improve her right hip flexor strength to at least 4-/5 for improved function with her daily activities. Baseline:  Goal status: INITIAL  5.  Patient will be able to demonstrate proper lifting mechanics for improved function with her yard work. Baseline:  Goal status: INITIAL  PLAN:  PT FREQUENCY: 1-2x/week  PT DURATION: 4 weeks  PLANNED  INTERVENTIONS: 97164- PT Re-evaluation, 97110-Therapeutic exercises, 97530- Therapeutic activity, [id number]- Neuromuscular re-education, 97535- Self Care, 29562- Manual therapy, 97014- Electrical stimulation (unattended), 97035- Ultrasound, 13086- Traction (mechanical), Patient/Family education, Balance training, Dry Needling, Joint mobilization, Spinal mobilization, Cryotherapy, and Moist heat.  PLAN FOR NEXT SESSION: NuStep, lumbar and core strengthening, and modalities as needed    Granville Lewis, PT 11/12/2022, 2:17 PM

## 2022-11-19 ENCOUNTER — Encounter: Payer: Self-pay | Admitting: *Deleted

## 2022-11-19 ENCOUNTER — Ambulatory Visit: Payer: Medicare Other | Admitting: *Deleted

## 2022-11-19 DIAGNOSIS — M6281 Muscle weakness (generalized): Secondary | ICD-10-CM | POA: Diagnosis not present

## 2022-11-19 DIAGNOSIS — M5459 Other low back pain: Secondary | ICD-10-CM

## 2022-11-19 NOTE — Therapy (Signed)
OUTPATIENT PHYSICAL THERAPY THORACOLUMBAR TREATMENT   Patient Name: Cynthia Bradshaw MRN: [id number] DOB:02/04/1945, 77 y.o., female Today's Date: 11/19/2022  END OF SESSION:  PT End of Session - 11/19/22 1331     Visit Number 3    Number of Visits 8    Date for PT Re-Evaluation 01/15/23    PT Start Time 1345    PT Stop Time 1435    PT Time Calculation (min) 50 min              Past Medical History:  Diagnosis Date   Anxiety    Arthritis    lower back   Cardiomyopathy (HCC)    Constipation    Dysrhythmia    LBBB   GERD (gastroesophageal reflux disease)    Headache    High cholesterol    Hypertension    IBS (irritable bowel syndrome)    Migraines    Seasonal allergies    Past Surgical History:  Procedure Laterality Date   ABDOMINAL SURGERY     ANTERIOR LAT LUMBAR FUSION N/A 02/28/2020   Procedure: Lateral Lumbar Interbody Fusion Lumbar two- lumbar three, Lumbar three-four, Lumbar four-five RIGHT;  Surgeon: Bedelia Person, MD;  Location: [facility] OR;  Service: Neurosurgery;  Laterality: N/A;   BREAST EXCISIONAL BIOPSY Left    COLON SURGERY     COLONOSCOPY     COLOSTOMY     COLOSTOMY REVERSAL     LUMBAR PERCUTANEOUS PEDICLE SCREW 3 LEVEL N/A 02/28/2020   Procedure: Minimally Invasive Right Lumbar four-five Laminectomy and Facetectomy with Percutaneous Pedicle Screw Placement Lumbar two-five;  Surgeon: Bedelia Person, MD;  Location: [facility] OR;  Service: Neurosurgery;  Laterality: N/A;   Patient Active Problem List   Diagnosis Date Noted   Lumbar radiculopathy 02/28/2020   Protein-calorie malnutrition, severe 09/17/2015   Hyponatremia 09/16/2015   Volume depletion 09/16/2015   IBS (irritable bowel syndrome) 09/16/2015   REFERRING PROVIDER: Bedelia Person, MD   REFERRING DIAG: Spondylolisthesis, lumbosacral region   Rationale for Evaluation and Treatment: Rehabilitation  THERAPY DIAG:  Other low back pain  Muscle weakness (generalized)  ONSET  DATE: chronic, but has been getting worse  SUBJECTIVE:                                                                                                                                                                                           SUBJECTIVE STATEMENT: Patient doing fairly well with 2-3/10 LBP right now. Increase throughout the day PERTINENT HISTORY:  Anxiety, arthritis, hypertension, and allergies  PAIN:  Are you having pain? 2-3/10  PRECAUTIONS: None  RED FLAGS: None  WEIGHT BEARING RESTRICTIONS: No  FALLS:  Has patient fallen in last 6 months? No  LIVING ENVIRONMENT: Lives with: lives with their spouse Lives in: House/apartment Stairs: Yes: Internal: 3-20 steps; on left going up; reciprocal pattern  Has following equipment at home: None  OCCUPATION: retired   PLOF: Independent  PATIENT GOALS: be able to walk to her mailbox, reduced pain, be able to garden, be able to return to yoga, and improved core strength   NEXT MD VISIT: 11/09/22  OBJECTIVE:  Note: Objective measures were completed at Evaluation unless otherwise noted.  DIAGNOSTIC FINDINGS: CT Lumbar spine No results released at this time  PATIENT SURVEYS:  FOTO 49.07  SCREENING FOR RED FLAGS: Bowel or bladder incontinence: No Spinal tumors: No Cauda equina syndrome: No Compression fracture: No Abdominal aneurysm: No  COGNITION: Overall cognitive status: Within functional limits for tasks assessed     SENSATION: Patient reports numbness in her right leg since her lumbar surgery in 2021.  POSTURE: forward head  PALPATION: TTP: right lumbar paraspinals, QL, and piriformis  JOINT MOBILITY:   Lumbar: hypomobile throughout with pain at L4-5   LUMBAR ROM:   AROM eval  Flexion 76  Extension 14  Right lateral flexion 25% limited; right hip pain   Left lateral flexion 25% limited  Right rotation 25% limited   Left rotation 25% limited   (Blank rows = not tested)  LOWER EXTREMITY  ROM: WFL for activities assessed  LOWER EXTREMITY MMT:    MMT Right eval Left eval  Hip flexion 3+/5 4-/5  Hip extension    Hip abduction    Hip adduction    Hip internal rotation    Hip external rotation    Knee flexion 3+/5 3+/5  Knee extension 5/5 4+/5  Ankle dorsiflexion 3+/5 3+/5  Ankle plantarflexion    Ankle inversion    Ankle eversion     (Blank rows = not tested)  GAIT:  Assistive device utilized: None Level of assistance: Complete Independence Comments: no significant gait deviations observed  TODAY'S TREATMENT:                                                                                                                              DATE:                                     11/19/22 EXERCISE LOG  Exercise Repetitions and Resistance Comments  Nustep  L3-4 x 13 minutes   Isometric ball press 3 minutes w/ 5 second hold    Ball roll out     Seated hip ADD isometric  3 minutes w/ 5 second hold    Eccentric heel tap  BUE support   Blank cell = exercise not performed today   PATIENT EDUCATION:  Education details: plan of care, HEP, and activity modification Person educated: Patient Education method: Hospital doctor  comprehension: verbalized understanding  HOME EXERCISE PROGRAM: [id number]  ASSESSMENT:  CLINICAL IMPRESSION: Pt arrived today doing fairly well, but reports flare-ups with certain act.'s and ADL's. Rx focused on core activation and bracing with ADL's and movement pattern changes to decrease pain triggers.Progression of core ativation with dying bug and standing modified bird-dog. Handout given for HEP.      OBJECTIVE IMPAIRMENTS: decreased activity tolerance, decreased mobility, decreased strength, hypomobility, impaired sensation, impaired tone, and pain.   ACTIVITY LIMITATIONS: lifting, standing, and locomotion level  PARTICIPATION LIMITATIONS: cleaning, shopping, community activity, and yard work  PERSONAL FACTORS: Past/current  experiences, Time since onset of injury/illness/exacerbation, and 3+ comorbidities: Anxiety, arthritis, hypertension, and allergies  are also affecting patient's functional outcome.   REHAB POTENTIAL: Fair    CLINICAL DECISION MAKING: Evolving/moderate complexity  EVALUATION COMPLEXITY: Moderate   GOALS: Goals reviewed with patient? Yes  LONG TERM GOALS: Target date: 12/02/22  Patient will be independent with her HEP. Baseline:  Goal status: INITIAL  2.  Patient will report being able to walk to her mailbox without being limited by her familiar symptoms. Baseline:  Goal status: INITIAL  3.  Patient will be able to complete her daily activities without her familiar pain exceeding 4/10. Baseline:  Goal status: INITIAL  4.  Patient will improve her right hip flexor strength to at least 4-/5 for improved function with her daily activities. Baseline:  Goal status: INITIAL  5.  Patient will be able to demonstrate proper lifting mechanics for improved function with her yard work. Baseline:  Goal status: INITIAL  PLAN:  PT FREQUENCY: 1-2x/week  PT DURATION: 4 weeks  PLANNED INTERVENTIONS: 97164- PT Re-evaluation, 97110-Therapeutic exercises, 97530- Therapeutic activity, [id number]- Neuromuscular re-education, 97535- Self Care, 16109- Manual therapy, 97014- Electrical stimulation (unattended), 97035- Ultrasound, 60454- Traction (mechanical), Patient/Family education, Balance training, Dry Needling, Joint mobilization, Spinal mobilization, Cryotherapy, and Moist heat.  PLAN FOR NEXT SESSION: NuStep, lumbar and core strengthening, and modalities as needed    Jovoni Borkenhagen,CHRIS, PTA 11/19/2022, 6:12 PM

## 2022-11-23 ENCOUNTER — Telehealth: Payer: Self-pay | Admitting: Cardiology

## 2022-11-23 MED ORDER — METOPROLOL SUCCINATE ER 50 MG PO TB24: 50.0000 mg | ORAL_TABLET | Freq: Every day | ORAL | 3 refills | Status: AC

## 2022-11-23 NOTE — Telephone Encounter (Signed)
Pt c/o medication issue:  1. Name of Medication:  carvedilol (COREG) 12.5 MG tablet  2. How are you currently taking this medication (dosage and times per day)?   3. Are you having a reaction (difficulty breathing--STAT)?   4. What is your medication issue?   Patient states she has dry eyes and this medication was causing her eyes to becoming extremely dry to the point she was unable to drive due to the glare from the sun. She states she stopped taking it about 1-2 weeks ago. Patient would like to speak with Susa Day, [facility] regarding this matter if at all possible.

## 2022-11-23 NOTE — Telephone Encounter (Signed)
Spoke to patient, reports carvedilol has affected her vision - blurry vision, increased light sensitivity, and severe dry eyes. Went back on metoprolol and symptoms improved but BP is now elevated ~ 150/80 heart rate 70 will change metoprolol tartrate to succinate and up the dose from 25 mg daily to 50 mg daily.   Advised patient to continue checking BP at home and report if it's stays above goal (<130/80)

## 2022-11-23 NOTE — Telephone Encounter (Signed)
Patient was started on Carvedilol via 9/19 phone call  Will forward Adelfa Koh D

## 2022-11-26 ENCOUNTER — Ambulatory Visit: Payer: Medicare Other | Attending: Neurosurgery | Admitting: *Deleted

## 2022-11-26 DIAGNOSIS — M6281 Muscle weakness (generalized): Secondary | ICD-10-CM | POA: Insufficient documentation

## 2022-11-26 DIAGNOSIS — M5459 Other low back pain: Secondary | ICD-10-CM | POA: Diagnosis not present

## 2022-11-26 NOTE — Therapy (Signed)
OUTPATIENT PHYSICAL THERAPY THORACOLUMBAR TREATMENT   Patient Name: Cynthia Bradshaw MRN: [id number] DOB:10-Jun-1945, 77 y.o., female Today's Date: 11/26/2022  END OF SESSION:  PT End of Session - 11/26/22 1255     Visit Number 4    Number of Visits 8    Date for PT Re-Evaluation 01/15/23    PT Start Time 1300    PT Stop Time 1350    PT Time Calculation (min) 50 min              Past Medical History:  Diagnosis Date   Anxiety    Arthritis    lower back   Cardiomyopathy (HCC)    Constipation    Dysrhythmia    LBBB   GERD (gastroesophageal reflux disease)    Headache    High cholesterol    Hypertension    IBS (irritable bowel syndrome)    Migraines    Seasonal allergies    Past Surgical History:  Procedure Laterality Date   ABDOMINAL SURGERY     ANTERIOR LAT LUMBAR FUSION N/A 02/28/2020   Procedure: Lateral Lumbar Interbody Fusion Lumbar two- lumbar three, Lumbar three-four, Lumbar four-five RIGHT;  Surgeon: Bedelia Person, MD;  Location: [facility] OR;  Service: Neurosurgery;  Laterality: N/A;   BREAST EXCISIONAL BIOPSY Left    COLON SURGERY     COLONOSCOPY     COLOSTOMY     COLOSTOMY REVERSAL     LUMBAR PERCUTANEOUS PEDICLE SCREW 3 LEVEL N/A 02/28/2020   Procedure: Minimally Invasive Right Lumbar four-five Laminectomy and Facetectomy with Percutaneous Pedicle Screw Placement Lumbar two-five;  Surgeon: Bedelia Person, MD;  Location: [facility] OR;  Service: Neurosurgery;  Laterality: N/A;   Patient Active Problem List   Diagnosis Date Noted   Lumbar radiculopathy 02/28/2020   Protein-calorie malnutrition, severe 09/17/2015   Hyponatremia 09/16/2015   Volume depletion 09/16/2015   IBS (irritable bowel syndrome) 09/16/2015   REFERRING PROVIDER: Bedelia Person, MD   REFERRING DIAG: Spondylolisthesis, lumbosacral region   Rationale for Evaluation and Treatment: Rehabilitation  THERAPY DIAG:  Other low back pain  Muscle weakness (generalized)  ONSET  DATE: chronic, but has been getting worse  SUBJECTIVE:                                                                                                                                                                                           SUBJECTIVE STATEMENT: Patient doing fairly well with 2-3/10 LBP right now.  Doing HEP    PERTINENT HISTORY:  Anxiety, arthritis, hypertension, and allergies  PAIN:  Are you having pain? 2-3/10  PRECAUTIONS: None  RED FLAGS: None  WEIGHT BEARING RESTRICTIONS: No  FALLS:  Has patient fallen in last 6 months? No  LIVING ENVIRONMENT: Lives with: lives with their spouse Lives in: House/apartment Stairs: Yes: Internal: 3-20 steps; on left going up; reciprocal pattern  Has following equipment at home: None  OCCUPATION: retired   PLOF: Independent  PATIENT GOALS: be able to walk to her mailbox, reduced pain, be able to garden, be able to return to yoga, and improved core strength   NEXT MD VISIT: 11/09/22  OBJECTIVE:  Note: Objective measures were completed at Evaluation unless otherwise noted.  DIAGNOSTIC FINDINGS: CT Lumbar spine No results released at this time  PATIENT SURVEYS:  FOTO 49.07  SCREENING FOR RED FLAGS: Bowel or bladder incontinence: No Spinal tumors: No Cauda equina syndrome: No Compression fracture: No Abdominal aneurysm: No  COGNITION: Overall cognitive status: Within functional limits for tasks assessed     SENSATION: Patient reports numbness in her right leg since her lumbar surgery in 2021.  POSTURE: forward head  PALPATION: TTP: right lumbar paraspinals, QL, and piriformis  JOINT MOBILITY:   Lumbar: hypomobile throughout with pain at L4-5   LUMBAR ROM:   AROM eval  Flexion 76  Extension 14  Right lateral flexion 25% limited; right hip pain   Left lateral flexion 25% limited  Right rotation 25% limited   Left rotation 25% limited   (Blank rows = not tested)  LOWER EXTREMITY ROM: WFL for  activities assessed  LOWER EXTREMITY MMT:    MMT Right eval Left eval  Hip flexion 3+/5 4-/5  Hip extension    Hip abduction    Hip adduction    Hip internal rotation    Hip external rotation    Knee flexion 3+/5 3+/5  Knee extension 5/5 4+/5  Ankle dorsiflexion 3+/5 3+/5  Ankle plantarflexion    Ankle inversion    Ankle eversion     (Blank rows = not tested)  GAIT:  Assistive device utilized: None Level of assistance: Complete Independence Comments: no significant gait deviations observed  TODAY'S TREATMENT:                                                                                                                              DATE:                                     11/26/22 EXERCISE LOG  Exercise Repetitions and Resistance Comments  Nustep  L3-4 x 15 minutes   Bird dog X 6 each hold 10 secs each   Dying bug X 6 hold 10 secs   Bridge X 10 hold 10 secs   Lat pull down Blue x10 hold 5 secs   Rows Blue x 10 hold 5 secs   Isometric ball press    Ball roll out     Seated hip ADD isometric     Eccentric  heel tap  BUE support   Blank cell = exercise not performed today   PATIENT EDUCATION:  Education details: plan of care, HEP, and activity modification Person educated: Patient Education method: Explanation Education comprehension: verbalized understanding  HOME EXERCISE PROGRAM: [id number]  ASSESSMENT:  CLINICAL IMPRESSION:FOTO performed Pt arrived today doing fairly well and reports doing better with ADL's and movement patterns with less LBP. Today's Rx focused on core strengthening in standing as well as hook lying. Pt did well with progressions without increased pain.     OBJECTIVE IMPAIRMENTS: decreased activity tolerance, decreased mobility, decreased strength, hypomobility, impaired sensation, impaired tone, and pain.   ACTIVITY LIMITATIONS: lifting, standing, and locomotion level  PARTICIPATION LIMITATIONS: cleaning, shopping, community activity,  and yard work  PERSONAL FACTORS: Past/current experiences, Time since onset of injury/illness/exacerbation, and 3+ comorbidities: Anxiety, arthritis, hypertension, and allergies  are also affecting patient's functional outcome.   REHAB POTENTIAL: Fair    CLINICAL DECISION MAKING: Evolving/moderate complexity  EVALUATION COMPLEXITY: Moderate   GOALS: Goals reviewed with patient? Yes  LONG TERM GOALS: Target date: 12/02/22  Patient will be independent with her HEP. Baseline:  Goal status: INITIAL  2.  Patient will report being able to walk to her mailbox without being limited by her familiar symptoms. Baseline:  Goal status: INITIAL  3.  Patient will be able to complete her daily activities without her familiar pain exceeding 4/10. Baseline:  Goal status: INITIAL  4.  Patient will improve her right hip flexor strength to at least 4-/5 for improved function with her daily activities. Baseline:  Goal status: INITIAL  5.  Patient will be able to demonstrate proper lifting mechanics for improved function with her yard work. Baseline:  Goal status: INITIAL  PLAN:  PT FREQUENCY: 1-2x/week  PT DURATION: 4 weeks  PLANNED INTERVENTIONS: 97164- PT Re-evaluation, 97110-Therapeutic exercises, 97530- Therapeutic activity, [id number]- Neuromuscular re-education, 97535- Self Care, 40981- Manual therapy, 97014- Electrical stimulation (unattended), 97035- Ultrasound, 19147- Traction (mechanical), Patient/Family education, Balance training, Dry Needling, Joint mobilization, Spinal mobilization, Cryotherapy, and Moist heat.  PLAN FOR NEXT SESSION: NuStep, lumbar and core strengthening, and modalities as needed    Varick Keys,CHRIS, PTA 11/26/2022, 1:55 PM

## 2022-11-30 DIAGNOSIS — M810 Age-related osteoporosis without current pathological fracture: Secondary | ICD-10-CM | POA: Diagnosis not present

## 2022-12-07 ENCOUNTER — Other Ambulatory Visit: Payer: Self-pay

## 2022-12-07 ENCOUNTER — Encounter (HOSPITAL_COMMUNITY): Payer: Self-pay | Admitting: Emergency Medicine

## 2022-12-07 ENCOUNTER — Emergency Department (HOSPITAL_COMMUNITY): Payer: Medicare Other | Admitting: Anesthesiology

## 2022-12-07 ENCOUNTER — Inpatient Hospital Stay (HOSPITAL_COMMUNITY)
Admission: EM | Admit: 2022-12-07 | Discharge: 2022-12-09 | DRG: 418 | Disposition: A | Discharge status: Home / Self Care | Payer: Medicare Other | Attending: General Surgery | Admitting: General Surgery

## 2022-12-07 ENCOUNTER — Emergency Department (HOSPITAL_COMMUNITY): Payer: Medicare Other

## 2022-12-07 ENCOUNTER — Encounter (HOSPITAL_COMMUNITY): Admission: EM | Disposition: A | Discharge status: Home / Self Care | Payer: Self-pay | Source: Home / Self Care

## 2022-12-07 DIAGNOSIS — Z79620 Long term (current) use of immunosuppressive biologic: Secondary | ICD-10-CM

## 2022-12-07 DIAGNOSIS — K219 Gastro-esophageal reflux disease without esophagitis: Secondary | ICD-10-CM | POA: Diagnosis present

## 2022-12-07 DIAGNOSIS — G8929 Other chronic pain: Secondary | ICD-10-CM | POA: Diagnosis present

## 2022-12-07 DIAGNOSIS — K8012 Calculus of gallbladder with acute and chronic cholecystitis without obstruction: Secondary | ICD-10-CM | POA: Diagnosis not present

## 2022-12-07 DIAGNOSIS — D62 Acute posthemorrhagic anemia: Secondary | ICD-10-CM | POA: Diagnosis not present

## 2022-12-07 DIAGNOSIS — K810 Acute cholecystitis: Secondary | ICD-10-CM | POA: Diagnosis not present

## 2022-12-07 DIAGNOSIS — K8000 Calculus of gallbladder with acute cholecystitis without obstruction: Principal | ICD-10-CM | POA: Diagnosis present

## 2022-12-07 DIAGNOSIS — Z9049 Acquired absence of other specified parts of digestive tract: Secondary | ICD-10-CM | POA: Diagnosis not present

## 2022-12-07 DIAGNOSIS — G8918 Other acute postprocedural pain: Secondary | ICD-10-CM | POA: Diagnosis not present

## 2022-12-07 DIAGNOSIS — E7800 Pure hypercholesterolemia, unspecified: Secondary | ICD-10-CM | POA: Diagnosis present

## 2022-12-07 DIAGNOSIS — K660 Peritoneal adhesions (postprocedural) (postinfection): Secondary | ICD-10-CM | POA: Diagnosis present

## 2022-12-07 DIAGNOSIS — I10 Essential (primary) hypertension: Secondary | ICD-10-CM | POA: Diagnosis present

## 2022-12-07 DIAGNOSIS — K82A1 Gangrene of gallbladder in cholecystitis: Secondary | ICD-10-CM | POA: Diagnosis present

## 2022-12-07 DIAGNOSIS — Z888 Allergy status to other drugs, medicaments and biological substances status: Secondary | ICD-10-CM | POA: Diagnosis not present

## 2022-12-07 DIAGNOSIS — R1011 Right upper quadrant pain: Principal | ICD-10-CM

## 2022-12-07 DIAGNOSIS — K828 Other specified diseases of gallbladder: Secondary | ICD-10-CM | POA: Diagnosis not present

## 2022-12-07 DIAGNOSIS — Z79899 Other long term (current) drug therapy: Secondary | ICD-10-CM

## 2022-12-07 DIAGNOSIS — I509 Heart failure, unspecified: Secondary | ICD-10-CM | POA: Diagnosis not present

## 2022-12-07 DIAGNOSIS — I429 Cardiomyopathy, unspecified: Secondary | ICD-10-CM | POA: Diagnosis not present

## 2022-12-07 DIAGNOSIS — Z981 Arthrodesis status: Secondary | ICD-10-CM | POA: Diagnosis not present

## 2022-12-07 DIAGNOSIS — I447 Left bundle-branch block, unspecified: Secondary | ICD-10-CM | POA: Diagnosis present

## 2022-12-07 DIAGNOSIS — E871 Hypo-osmolality and hyponatremia: Secondary | ICD-10-CM | POA: Diagnosis present

## 2022-12-07 DIAGNOSIS — I110 Hypertensive heart disease with heart failure: Secondary | ICD-10-CM | POA: Diagnosis not present

## 2022-12-07 DIAGNOSIS — R1031 Right lower quadrant pain: Secondary | ICD-10-CM | POA: Diagnosis not present

## 2022-12-07 HISTORY — PX: CHOLECYSTECTOMY: SHX55

## 2022-12-07 LAB — CBC
HCT: 35.3 % — ABNORMAL LOW (ref 36.0–46.0)
HCT: 25.6 % — ABNORMAL LOW (ref 36.0–46.0)
Hemoglobin: 11.8 g/dL — ABNORMAL LOW (ref 12.0–15.0)
Hemoglobin: 8.7 g/dL — ABNORMAL LOW (ref 12.0–15.0)
MCH: 30.9 pg (ref 26.0–34.0)
MCH: 31.3 pg (ref 26.0–34.0)
MCHC: 33.4 g/dL (ref 30.0–36.0)
MCHC: 34 g/dL (ref 30.0–36.0)
MCV: 92.4 fL (ref 80.0–100.0)
MCV: 92.1 fL (ref 80.0–100.0)
Platelets: 318 10*3/uL (ref 150–400)
Platelets: 234 10*3/uL (ref 150–400)
RBC: 3.82 MIL/uL — ABNORMAL LOW (ref 3.87–5.11)
RBC: 2.78 MIL/uL — ABNORMAL LOW (ref 3.87–5.11)
RDW: 11.9 % (ref 11.5–15.5)
RDW: 12.1 % (ref 11.5–15.5)
WBC: 25 10*3/uL — ABNORMAL HIGH (ref 4.0–10.5)
WBC: 14.3 10*3/uL — ABNORMAL HIGH (ref 4.0–10.5)
nRBC: 0 % (ref 0.0–0.2)
nRBC: 0 % (ref 0.0–0.2)

## 2022-12-07 LAB — URINALYSIS, ROUTINE W REFLEX MICROSCOPIC
Bilirubin Urine: NEGATIVE
Glucose, UA: NEGATIVE mg/dL
Ketones, ur: 5 mg/dL — AB
Leukocytes,Ua: NEGATIVE
Nitrite: NEGATIVE
Protein, ur: 100 mg/dL — AB
Specific Gravity, Urine: 1.015 (ref 1.005–1.030)
pH: 5 (ref 5.0–8.0)

## 2022-12-07 LAB — COMPREHENSIVE METABOLIC PANEL
ALT: 17 U/L (ref 0–44)
AST: 23 U/L (ref 15–41)
Albumin: 4 g/dL (ref 3.5–5.0)
Alkaline Phosphatase: 48 U/L (ref 38–126)
Anion gap: 11 (ref 5–15)
BUN: 10 mg/dL (ref 8–23)
CO2: 22 mmol/L (ref 22–32)
Calcium: 9.3 mg/dL (ref 8.9–10.3)
Chloride: 95 mmol/L — ABNORMAL LOW (ref 98–111)
Creatinine, Ser: 0.77 mg/dL (ref 0.44–1.00)
GFR, Estimated: >60 mL/min (ref >60)
Glucose, Bld: 142 mg/dL — ABNORMAL HIGH (ref 70–99)
Potassium: 3.6 mmol/L (ref 3.5–5.1)
Sodium: 128 mmol/L — ABNORMAL LOW (ref 135–145)
Total Bilirubin: 0.8 mg/dL (ref <1.2)
Total Protein: 7 g/dL (ref 6.5–8.1)

## 2022-12-07 LAB — LIPASE, BLOOD: Lipase: 27 U/L (ref 11–51)

## 2022-12-07 SURGERY — LAPAROSCOPIC CHOLECYSTECTOMY
Anesthesia: General | Site: Abdomen

## 2022-12-07 MED ORDER — MELATONIN 3 MG PO TABS: 3.0000 mg | ORAL_TABLET | Freq: Every evening | ORAL | Status: DC | PRN

## 2022-12-07 MED ORDER — ONDANSETRON HCL 4 MG/2ML IJ SOLN: 4.0000 mg | Freq: Four times a day (QID) | INTRAMUSCULAR | Status: DC | PRN

## 2022-12-07 MED ORDER — SODIUM CHLORIDE 0.9 % IR SOLN
Status: DC | PRN
  Administered 2022-12-07: 1

## 2022-12-07 MED ORDER — MORPHINE SULFATE (PF) 2 MG/ML IV SOLN: 1.0000 mg | INTRAVENOUS | Status: DC | PRN

## 2022-12-07 MED ORDER — DIPHENHYDRAMINE HCL 50 MG/ML IJ SOLN: 12.5000 mg | Freq: Four times a day (QID) | INTRAMUSCULAR | Status: DC | PRN

## 2022-12-07 MED ORDER — CHLORHEXIDINE GLUCONATE 0.12 % MT SOLN
15.0000 mL | Freq: Once | OROMUCOSAL | Status: AC
Start: 2022-12-07 — End: 2022-12-07

## 2022-12-07 MED ORDER — SIMETHICONE 80 MG PO CHEW: 40.0000 mg | CHEWABLE_TABLET | Freq: Four times a day (QID) | ORAL | Status: DC | PRN

## 2022-12-07 MED ORDER — OXYCODONE HCL 5 MG/5ML PO SOLN: 5.0000 mg | Freq: Once | ORAL | Status: DC | PRN

## 2022-12-07 MED ORDER — PIPERACILLIN-TAZOBACTAM 3.375 G IVPB 30 MIN
3.3750 g | Freq: Once | INTRAVENOUS | Status: AC
  Administered 2022-12-07: 3.375 g via INTRAVENOUS
  Filled 2022-12-07: qty 50

## 2022-12-07 MED ORDER — LACTATED RINGERS IV SOLN: INTRAVENOUS | Status: DC

## 2022-12-07 MED ORDER — GABAPENTIN 300 MG PO CAPS: 300.0000 mg | ORAL_CAPSULE | Freq: Once | ORAL | Status: DC

## 2022-12-07 MED ORDER — CHLORHEXIDINE GLUCONATE 0.12 % MT SOLN
OROMUCOSAL | Status: AC
  Administered 2022-12-07: 15 mL via OROMUCOSAL
  Filled 2022-12-07: qty 15

## 2022-12-07 MED ORDER — ONDANSETRON HCL 4 MG/2ML IJ SOLN
4.0000 mg | Freq: Once | INTRAMUSCULAR | Status: AC
  Administered 2022-12-07: 4 mg via INTRAVENOUS
  Filled 2022-12-07: qty 2

## 2022-12-07 MED ORDER — ACETAMINOPHEN 500 MG PO TABS
1000.0000 mg | ORAL_TABLET | ORAL | Status: AC
  Administered 2022-12-07: 1000 mg via ORAL
  Filled 2022-12-07: qty 2

## 2022-12-07 MED ORDER — HYDROMORPHONE HCL 1 MG/ML IJ SOLN: 0.2500 mg | INTRAMUSCULAR | Status: DC | PRN

## 2022-12-07 MED ORDER — DROPERIDOL 2.5 MG/ML IJ SOLN: 0.6250 mg | Freq: Once | INTRAMUSCULAR | Status: DC | PRN

## 2022-12-07 MED ORDER — LIDOCAINE 2% (20 MG/ML) 5 ML SYRINGE
INTRAMUSCULAR | Status: DC | PRN
  Administered 2022-12-07: 50 mg via INTRAVENOUS

## 2022-12-07 MED ORDER — FENTANYL CITRATE (PF) 250 MCG/5ML IJ SOLN
INTRAMUSCULAR | Status: AC
  Filled 2022-12-07: qty 5

## 2022-12-07 MED ORDER — FENTANYL CITRATE PF 50 MCG/ML IJ SOSY
50.0000 ug | PREFILLED_SYRINGE | Freq: Once | INTRAMUSCULAR | Status: AC
  Administered 2022-12-07: 50 ug via INTRAVENOUS
  Filled 2022-12-07: qty 1

## 2022-12-07 MED ORDER — ROCURONIUM BROMIDE 10 MG/ML (PF) SYRINGE
PREFILLED_SYRINGE | INTRAVENOUS | Status: AC
  Filled 2022-12-07: qty 10

## 2022-12-07 MED ORDER — DEXAMETHASONE SODIUM PHOSPHATE 4 MG/ML IJ SOLN
INTRAMUSCULAR | Status: DC | PRN
  Administered 2022-12-07: 6 mg

## 2022-12-07 MED ORDER — ONDANSETRON HCL 4 MG/2ML IJ SOLN
INTRAMUSCULAR | Status: AC
  Filled 2022-12-07: qty 2

## 2022-12-07 MED ORDER — OXYCODONE HCL 5 MG PO TABS: 5.0000 mg | ORAL_TABLET | Freq: Once | ORAL | Status: DC | PRN

## 2022-12-07 MED ORDER — METOPROLOL SUCCINATE ER 25 MG PO TB24
50.0000 mg | ORAL_TABLET | Freq: Every day | ORAL | Status: DC
  Administered 2022-12-07 – 2022-12-09 (×2): 50 mg via ORAL
  Filled 2022-12-07 (×3): qty 2

## 2022-12-07 MED ORDER — IOHEXOL 350 MG/ML SOLN
60.0000 mL | Freq: Once | INTRAVENOUS | Status: AC | PRN
  Administered 2022-12-07: 60 mL via INTRAVENOUS

## 2022-12-07 MED ORDER — IRBESARTAN 150 MG PO TABS
150.0000 mg | ORAL_TABLET | Freq: Every day | ORAL | Status: DC
  Administered 2022-12-07 – 2022-12-09 (×2): 150 mg via ORAL
  Filled 2022-12-07 (×3): qty 1

## 2022-12-07 MED ORDER — 0.9 % SODIUM CHLORIDE (POUR BTL) OPTIME
TOPICAL | Status: DC | PRN
  Administered 2022-12-07: 1000 mL

## 2022-12-07 MED ORDER — ONDANSETRON HCL 4 MG/2ML IJ SOLN
INTRAMUSCULAR | Status: DC | PRN
  Administered 2022-12-07: 4 mg via INTRAVENOUS

## 2022-12-07 MED ORDER — BUPIVACAINE HCL (PF) 0.25 % IJ SOLN
INTRAMUSCULAR | Status: DC | PRN
  Administered 2022-12-07: 50 mL via EPIDURAL

## 2022-12-07 MED ORDER — DEXAMETHASONE SODIUM PHOSPHATE 10 MG/ML IJ SOLN
INTRAMUSCULAR | Status: DC | PRN
  Administered 2022-12-07: 10 mg via INTRAVENOUS

## 2022-12-07 MED ORDER — PHENYLEPHRINE 80 MCG/ML (10ML) SYRINGE FOR IV PUSH (FOR BLOOD PRESSURE SUPPORT)
PREFILLED_SYRINGE | INTRAVENOUS | Status: DC | PRN
  Administered 2022-12-07 (×5): 160 ug via INTRAVENOUS

## 2022-12-07 MED ORDER — PIPERACILLIN-TAZOBACTAM 3.375 G IVPB
3.3750 g | Freq: Three times a day (TID) | INTRAVENOUS | Status: AC
Start: 2022-12-07 — End: 2022-12-08
  Administered 2022-12-07 – 2022-12-08 (×3): 3.375 g via INTRAVENOUS
  Filled 2022-12-07 (×3): qty 50

## 2022-12-07 MED ORDER — PROPOFOL 10 MG/ML IV BOLUS
INTRAVENOUS | Status: DC | PRN
  Administered 2022-12-07: 90 mg via INTRAVENOUS

## 2022-12-07 MED ORDER — CLONIDINE HCL (ANALGESIA) 100 MCG/ML EP SOLN
EPIDURAL | Status: DC | PRN
  Administered 2022-12-07: 80 ug

## 2022-12-07 MED ORDER — DIPHENHYDRAMINE HCL 12.5 MG/5ML PO ELIX: 12.5000 mg | ORAL_SOLUTION | Freq: Four times a day (QID) | ORAL | Status: DC | PRN

## 2022-12-07 MED ORDER — HYDRALAZINE HCL 20 MG/ML IJ SOLN: 10.0000 mg | INTRAMUSCULAR | Status: DC | PRN

## 2022-12-07 MED ORDER — LACTATED RINGERS IV BOLUS
1000.0000 mL | Freq: Once | INTRAVENOUS | Status: AC
  Administered 2022-12-07: 1000 mL via INTRAVENOUS

## 2022-12-07 MED ORDER — KCL IN DEXTROSE-NACL 20-5-0.45 MEQ/L-%-% IV SOLN
INTRAVENOUS | Status: DC
Start: 2022-12-07 — End: 2022-12-08
  Filled 2022-12-07: qty 1000

## 2022-12-07 MED ORDER — SUGAMMADEX SODIUM 200 MG/2ML IV SOLN
INTRAVENOUS | Status: DC | PRN
  Administered 2022-12-07: 200 mg via INTRAVENOUS

## 2022-12-07 MED ORDER — POLYETHYLENE GLYCOL 3350 17 G PO PACK: 17.0000 g | PACK | Freq: Every day | ORAL | Status: DC | PRN

## 2022-12-07 MED ORDER — ROCURONIUM BROMIDE 10 MG/ML (PF) SYRINGE
PREFILLED_SYRINGE | INTRAVENOUS | Status: DC | PRN
  Administered 2022-12-07: 50 mg via INTRAVENOUS
  Administered 2022-12-07: 20 mg via INTRAVENOUS
  Administered 2022-12-07: 30 mg via INTRAVENOUS

## 2022-12-07 MED ORDER — OXYCODONE HCL 5 MG PO TABS: 5.0000 mg | ORAL_TABLET | ORAL | Status: DC | PRN

## 2022-12-07 MED ORDER — DEXAMETHASONE SODIUM PHOSPHATE 10 MG/ML IJ SOLN
INTRAMUSCULAR | Status: AC
Start: 2022-12-07 — End: ?
  Filled 2022-12-07: qty 1

## 2022-12-07 MED ORDER — ATORVASTATIN CALCIUM 10 MG PO TABS
20.0000 mg | ORAL_TABLET | Freq: Every day | ORAL | Status: DC
  Administered 2022-12-07 – 2022-12-09 (×3): 20 mg via ORAL
  Filled 2022-12-07 (×3): qty 2

## 2022-12-07 MED ORDER — FENTANYL CITRATE (PF) 250 MCG/5ML IJ SOLN
INTRAMUSCULAR | Status: DC | PRN
  Administered 2022-12-07 (×2): 50 ug via INTRAVENOUS

## 2022-12-07 MED ORDER — PANTOPRAZOLE SODIUM 40 MG PO TBEC
40.0000 mg | DELAYED_RELEASE_TABLET | Freq: Every day | ORAL | Status: DC
  Administered 2022-12-07 – 2022-12-09 (×3): 40 mg via ORAL
  Filled 2022-12-07 (×3): qty 1

## 2022-12-07 MED ORDER — PHENYLEPHRINE HCL-NACL 20-0.9 MG/250ML-% IV SOLN
INTRAVENOUS | Status: DC | PRN
Start: 2022-12-07 — End: 2022-12-07
  Administered 2022-12-07: 30 ug/min via INTRAVENOUS

## 2022-12-07 MED ORDER — MORPHINE SULFATE (PF) 4 MG/ML IV SOLN
4.0000 mg | Freq: Once | INTRAVENOUS | Status: AC
  Administered 2022-12-07: 4 mg via INTRAVENOUS
  Filled 2022-12-07: qty 1

## 2022-12-07 MED ORDER — PROPOFOL 10 MG/ML IV BOLUS
INTRAVENOUS | Status: AC
Start: 2022-12-07 — End: ?
  Filled 2022-12-07: qty 20

## 2022-12-07 MED ORDER — ACETAMINOPHEN 500 MG PO TABS
1000.0000 mg | ORAL_TABLET | Freq: Four times a day (QID) | ORAL | Status: DC
  Administered 2022-12-07 – 2022-12-09 (×8): 1000 mg via ORAL
  Filled 2022-12-07 (×8): qty 2

## 2022-12-07 MED ORDER — PHENYLEPHRINE 80 MCG/ML (10ML) SYRINGE FOR IV PUSH (FOR BLOOD PRESSURE SUPPORT)
PREFILLED_SYRINGE | INTRAVENOUS | Status: AC
Start: 2022-12-07 — End: ?
  Filled 2022-12-07: qty 10

## 2022-12-07 MED ORDER — SPY AGENT GREEN - (INDOCYANINE FOR INJECTION)
1.2500 mg | Freq: Once | INTRAMUSCULAR | Status: AC
  Administered 2022-12-07: 1.25 mg via INTRAVENOUS
  Filled 2022-12-07: qty 10

## 2022-12-07 MED ORDER — LIDOCAINE 2% (20 MG/ML) 5 ML SYRINGE
INTRAMUSCULAR | Status: AC
  Filled 2022-12-07: qty 5

## 2022-12-07 MED ORDER — BUPIVACAINE-EPINEPHRINE 0.25% -1:200000 IJ SOLN
INTRAMUSCULAR | Status: DC | PRN
  Administered 2022-12-07: 8 mL

## 2022-12-07 MED ORDER — ORAL CARE MOUTH RINSE: 15.0000 mL | Freq: Once | OROMUCOSAL | Status: AC

## 2022-12-07 MED ORDER — ONDANSETRON 4 MG PO TBDP: 4.0000 mg | ORAL_TABLET | Freq: Four times a day (QID) | ORAL | Status: DC | PRN

## 2022-12-07 MED ORDER — BUPIVACAINE-EPINEPHRINE (PF) 0.25% -1:200000 IJ SOLN
INTRAMUSCULAR | Status: AC
  Filled 2022-12-07: qty 30

## 2022-12-07 MED ORDER — ENOXAPARIN SODIUM 40 MG/0.4ML IJ SOSY: 40.0000 mg | PREFILLED_SYRINGE | INTRAMUSCULAR | Status: DC

## 2022-12-07 SURGICAL SUPPLY — 39 items
APPLIER CLIP 5 13 M/L LIGAMAX5 (MISCELLANEOUS) ×1
BAG COUNTER SPONGE SURGICOUNT (BAG) ×1 IMPLANT
BLADE CLIPPER SURG (BLADE) IMPLANT
CANISTER SUCT 3000ML PPV (MISCELLANEOUS) ×1 IMPLANT
CHLORAPREP W/TINT 26 (MISCELLANEOUS) ×1 IMPLANT
CLIP APPLIE 5 13 M/L LIGAMAX5 (MISCELLANEOUS) ×1 IMPLANT
COVER SURGICAL LIGHT HANDLE (MISCELLANEOUS) ×1 IMPLANT
DERMABOND ADVANCED .7 DNX12 (GAUZE/BANDAGES/DRESSINGS) ×1 IMPLANT
ELECT REM PT RETURN 9FT ADLT (ELECTROSURGICAL) ×1
ELECTRODE REM PT RTRN 9FT ADLT (ELECTROSURGICAL) ×1 IMPLANT
GLOVE BIO SURGEON STRL SZ7 (GLOVE) ×1 IMPLANT
GLOVE BIOGEL PI IND STRL 7.5 (GLOVE) ×1 IMPLANT
GOWN STRL REUS W/ TWL LRG LVL3 (GOWN DISPOSABLE) ×3 IMPLANT
GOWN STRL REUS W/TWL LRG LVL3 (GOWN DISPOSABLE) ×5
GRASPER SUT TROCAR 14GX15 (MISCELLANEOUS) ×1 IMPLANT
IRRIG SUCT STRYKERFLOW 2 WTIP (MISCELLANEOUS) ×1
IRRIGATION SUCT STRKRFLW 2 WTP (MISCELLANEOUS) ×1 IMPLANT
KIT BASIN OR (CUSTOM PROCEDURE TRAY) ×1 IMPLANT
KIT IMAGING PINPOINTPAQ (MISCELLANEOUS) IMPLANT
KIT TURNOVER KIT B (KITS) ×1 IMPLANT
NS IRRIG 1000ML POUR BTL (IV SOLUTION) ×1 IMPLANT
PAD ARMBOARD 7.5X6 YLW CONV (MISCELLANEOUS) ×1 IMPLANT
POUCH RETRIEVAL ECOSAC 10 (ENDOMECHANICALS) ×1 IMPLANT
SCISSORS LAP 5X35 DISP (ENDOMECHANICALS) ×1 IMPLANT
SET TUBE SMOKE EVAC HIGH FLOW (TUBING) ×1 IMPLANT
SLEEVE Z-THREAD 5X100MM (TROCAR) ×2 IMPLANT
SPECIMEN JAR SMALL (MISCELLANEOUS) ×1 IMPLANT
STRIP CLOSURE SKIN 1/2X4 (GAUZE/BANDAGES/DRESSINGS) ×1 IMPLANT
SUT MNCRL AB 4-0 PS2 18 (SUTURE) ×1 IMPLANT
SUT VICRYL 0 UR6 27IN ABS (SUTURE) ×1 IMPLANT
TOWEL GREEN STERILE (TOWEL DISPOSABLE) ×1 IMPLANT
TOWEL GREEN STERILE FF (TOWEL DISPOSABLE) ×1 IMPLANT
TRAY LAPAROSCOPIC MC (CUSTOM PROCEDURE TRAY) ×1 IMPLANT
TROCAR 11X100 Z THREAD (TROCAR) IMPLANT
TROCAR ADV FIXATION 5X100MM (TROCAR) IMPLANT
TROCAR BALLN 12MMX100 BLUNT (TROCAR) ×1 IMPLANT
TROCAR Z-THREAD OPTICAL 5X100M (TROCAR) ×1 IMPLANT
WARMER LAPAROSCOPE (MISCELLANEOUS) ×1 IMPLANT
WATER STERILE IRR 1000ML POUR (IV SOLUTION) ×1 IMPLANT

## 2022-12-07 NOTE — Anesthesia Preprocedure Evaluation (Addendum)
Anesthesia Evaluation  Patient identified by MRN, date of birth, ID band Patient awake    Reviewed: Allergy & Precautions, NPO status , Patient's Chart, lab work & pertinent test results  Airway Mallampati: II  TM Distance: >3 FB Neck ROM: Full    Dental  (+) Dental Advisory Given, Teeth Intact   Pulmonary neg pulmonary ROS, neg recent URI   Pulmonary exam normal breath sounds clear to auscultation       Cardiovascular METS: 5 - 7 Mets hypertension, Pt. on home beta blockers +CHF  (-) Orthopnea, (-) PND and (-) DOE + dysrhythmias  Rhythm:Regular Rate:Normal + Systolic murmurs Echo 2021  1. Left ventricular ejection fraction, by estimation, is 40 to 45%. The left ventricle has mildly decreased function. The left ventricle has no regional wall motion abnormalities. Left ventricular diastolic parameters are consistent with Grade I diastolic dysfunction (impaired relaxation).   2. Right ventricular systolic function is normal. The right ventricular size is normal. There is normal pulmonary artery systolic pressure.   3. The mitral valve is normal in structure. No evidence of mitral valve regurgitation. No evidence of mitral stenosis.   4. The aortic valve is normal in structure. Aortic valve regurgitation is mild. Mild to moderate aortic valve sclerosis/calcification is present, without any evidence of aortic stenosis. Aortic regurgitation PHT measures 337 msec.   5. The inferior vena cava is normal in size with greater than 50% respiratory variability, suggesting right atrial pressure of 3 mmHg.     Neuro/Psych  Headaches  Anxiety        GI/Hepatic Neg liver ROS,GERD  Controlled and Medicated,,  Endo/Other  negative endocrine ROS    Renal/GU negative Renal ROS     Musculoskeletal  (+) Arthritis ,    Abdominal   Peds  Hematology negative hematology ROS (+)   Anesthesia Other Findings   Reproductive/Obstetrics                              Anesthesia Physical Anesthesia Plan  ASA: 3  Anesthesia Plan: General   Post-op Pain Management: Tylenol PO (pre-op)*, Gabapentin PO (pre-op)* and Regional block*   Induction: Intravenous  PONV Risk Score and Plan: 4 or greater and Ondansetron, Dexamethasone and Treatment may vary due to age or medical condition  Airway Management Planned: Oral ETT  Additional Equipment:   Intra-op Plan:   Post-operative Plan: Extubation in OR  Informed Consent: I have reviewed the patients History and Physical, chart, labs and discussed the procedure including the risks, benefits and alternatives for the proposed anesthesia with the patient or authorized representative who has indicated his/her understanding and acceptance.     Dental advisory given  Plan Discussed with: CRNA  Anesthesia Plan Comments: (Risks of anesthesia explained at length. This includes, but is not limited to, sore throat, damage to teeth, lips gums, tongue and vocal cords, nausea and vomiting, reactions to medications, stroke, heart attack, and death. All patient questions were answered and the patient wishes to proceed. Risks of peripheral nerve block explained at length. This includes, but is not limited to, bleeding, infection, reactions to the medications, seizures, damage to surrounding structures, damage to nerves, permanent weakness, numbness, tingling and pain. All patient questions were answered and patient wishes to proceed with nerve block. )        Anesthesia Quick Evaluation

## 2022-12-07 NOTE — Transfer of Care (Signed)
Immediate Anesthesia Transfer of Care Note  Patient: Cynthia Bradshaw  Procedure(s) Performed: LAPAROSCOPIC CHOLECYSTECTOMY WITH ICG DYE (Abdomen)  Patient Location: PACU  Anesthesia Type:General  Level of Consciousness: awake, drowsy, and patient cooperative  Airway & Oxygen Therapy: Patient Spontanous Breathing and Patient connected to face mask oxygen  Post-op Assessment: Report given to RN and Post -op Vital signs reviewed and stable  Post vital signs: Reviewed and stable  Last Vitals:  Vitals Value Taken Time  BP 125/64 12/07/22 1617  Temp    Pulse 78 12/07/22 1619  Resp 15 12/07/22 1619  SpO2 100 % 12/07/22 1619  Vitals shown include unfiled device data.  Last Pain:  Vitals:   12/07/22 1249  TempSrc: Oral  PainSc:          Complications: No notable events documented.

## 2022-12-07 NOTE — ED Provider Notes (Signed)
[place] EMERGENCY DEPARTMENT AT [facility] Provider Note   CSN: [id number] Arrival date & time: 12/07/22  0141     History  Chief Complaint  Patient presents with   Abdominal Pain    Cynthia Bradshaw is a 77 y.o. female.  The history is provided by the patient and the spouse.  Patient with history of hypertension, hyperlipidemia, & previous colostomy presents with abdominal pain.  Patient reports over the past day she has had increasing right upper and right lower quadrant pain.  She also reports associated nonbloody emesis.  No change in her bowel movements.  No chest pain or shortness of breath.  No cough.  She had otherwise been at her baseline. She does not recall having this pain previously.    Past Medical History:  Diagnosis Date   Anxiety    Arthritis    lower back   Cardiomyopathy (HCC)    Constipation    Dysrhythmia    LBBB   GERD (gastroesophageal reflux disease)    Headache    High cholesterol    Hypertension    IBS (irritable bowel syndrome)    Migraines    Seasonal allergies     Home Medications Prior to Admission medications   Medication Sig Start Date End Date Taking? Authorizing Provider  ALPRAZolam (XANAX) 0.25 MG tablet Take 0.25 mg by mouth as needed. 11/20/20   [provider]  atorvastatin (LIPITOR) 20 MG tablet Take 20 mg by mouth daily.    [provider]  cholecalciferol (VITAMIN D) 1000 units tablet Take 1,000 Units by mouth daily.     [provider]  denosumab (PROLIA) 60 MG/ML SOSY injection Inject 60 mg into the skin every 6 (six) months.    [provider]  dicyclomine (BENTYL) 20 MG tablet Take 20 mg by mouth daily.    [provider]  metoprolol succinate (TOPROL-XL) 50 MG 24 hr tablet Take 1 tablet (50 mg total) by mouth daily. Take with or immediately following a meal. 11/23/22 02/21/23  Jodelle Red, MD  Multiple Vitamin (MULTIVITAMIN WITH MINERALS) TABS tablet  Take 1 tablet by mouth daily.    [provider]  Omega-3 Fatty Acids (FISH OIL ADULT GUMMIES PO) Take by mouth.    [provider]  omeprazole (PRILOSEC) 20 MG capsule Take 20 mg by mouth daily.    [provider]  polyethylene glycol (MIRALAX / GLYCOLAX) 17 g packet Take 17 g by mouth 2 (two) times a week.    [provider]  POTASSIUM PO Take 50 mg by mouth daily as needed (foot cramps).    [provider]  Probiotic Product (DIGESTIVE ADVANTAGE) CAPS Take 1 capsule by mouth daily.    [provider]  rizatriptan (MAXALT) 10 MG tablet Take 10 mg by mouth as needed for migraine. May repeat in 2 hours if needed    [provider]  valsartan (DIOVAN) 160 MG tablet TAKE ONE TABLET ONCE DAILY 06/22/22   Jodelle Red, MD  vitamin E 180 MG (400 UNITS) capsule Take 400 Units by mouth daily.    [provider]      Allergies    Carvedilol    Review of Systems   Review of Systems  Constitutional:  Negative for fever.  Respiratory:  Negative for cough and shortness of breath.   Cardiovascular:  Negative for chest pain.  Gastrointestinal:  Positive for nausea and vomiting. Negative for blood in stool.  Genitourinary:  Negative  for dysuria.    Physical Exam Updated Vital Signs BP 139/62   Pulse 66   Temp 99.3 F (37.4 C) (Oral)   Resp (!) 7   Ht 1.626 m (5\' 4" )   Wt 55.3 kg   LMP  (LMP Unknown)   SpO2 96%   BMI 20.94 kg/m  Physical Exam CONSTITUTIONAL: Elderly, frail HEAD: Normocephalic/atraumatic EYES: EOMI/PERRL ENMT: Mucous membranes dry NECK: supple no meningeal signs CV: S1/S2 noted, no murmurs/rubs/gallops noted LUNGS: Lungs are clear to auscultation bilaterally, no apparent distress ABDOMEN: soft, midline exploratory laparotomy scar noted No obvious abdominal wall hernias. Exquisite tenderness in the right upper quadrant and right lower quadrant The rest the abdomen is soft.  Bowel sounds are  noted throughout, no rebound, mild guarding is noted GU:no cva tenderness NEURO: Pt is awake/alert/appropriate, moves all extremitiesx4.  No facial droop.   EXTREMITIES: pulses normal/equal, full ROM SKIN: Pale PSYCH: no abnormalities of mood noted, alert and oriented to situation  ED Results / Procedures / Treatments   Labs (all labs ordered are listed, but only abnormal results are displayed) Labs Reviewed  COMPREHENSIVE METABOLIC PANEL - Abnormal; Notable for the following components:      Result Value   Sodium 128 (*)    Chloride 95 (*)    Glucose, Bld 142 (*)    All other components within normal limits  CBC - Abnormal; Notable for the following components:   WBC 25.0 (*)    RBC 3.82 (*)    Hemoglobin 11.8 (*)    HCT 35.3 (*)    All other components within normal limits  URINALYSIS, ROUTINE W REFLEX MICROSCOPIC - Abnormal; Notable for the following components:   Hgb urine dipstick SMALL (*)    Ketones, ur 5 (*)    Protein, ur 100 (*)    Bacteria, UA RARE (*)    All other components within normal limits  LIPASE, BLOOD    EKG EKG Interpretation Date/Time:  Monday December 07 2022 01:59:41 EST Ventricular Rate:  82 PR Interval:  188 QRS Duration:  154 QT Interval:  438 QTC Calculation: 511 R Axis:   -26  Text Interpretation: Normal sinus rhythm Left bundle branch block Abnormal ECG No significant change since last tracing Interpretation limited secondary to artifact Confirmed by Zadie Rhine ([id number]) on 12/07/2022 5:57:08 AM  Radiology No results found.  Procedures Procedures    Medications Ordered in ED Medications  fentaNYL (SUBLIMAZE) injection 50 mcg (50 mcg Intravenous Given 12/07/22 0700)  ondansetron (ZOFRAN) injection 4 mg (4 mg Intravenous Given 12/07/22 2130)    ED Course/ Medical Decision Making/ A&P Clinical Course as of 12/07/22 0705  Mon Dec 07, 2022  0606 Sodium(!): 128 Hyponatremia [DW]  0606 WBC(!): 25.0 Leukocytosis [DW]  0611  Patient reports increasing abdominal pain over the past day.  Patient has significant history of distant Hartmann procedure with colostomy and reversal.  Patient has right upper quadrant and right lower quadrant tenderness. Will proceed with CT imaging [DW]  0655 Signed out to dr Adela Lank at shift change with CT imaging pending  [DW]    Clinical Course User Index [DW] Zadie Rhine, MD                                 Medical Decision Making Amount and/or Complexity of Data Reviewed Labs: ordered. Decision-making details documented in ED Course. Radiology: ordered.  Risk Prescription drug management.   This patient  presents to the ED for concern of abdominal pain, this involves an extensive number of treatment options, and is a complaint that carries with it a high risk of complications and morbidity.  The differential diagnosis includes but is not limited to cholecystitis, cholelithiasis, pancreatitis, gastritis, peptic ulcer disease, appendicitis, bowel obstruction, bowel perforation, diverticulitis, AAA, ischemic bowel, acute coronary syndrome    Comorbidities that complicate the patient evaluation: Patient's presentation is complicated by their history of hypertension, previous colostomy   Additional history obtained: Additional history obtained from spouse Records reviewed previous admission documents  Lab Tests: I Ordered, and personally interpreted labs.  The pertinent results include: Hyponatremia, leukocytosis   Medicines ordered and prescription drug management: I ordered medication including fentanyl for pain  Complexity of problems addressed: Patient's presentation is most consistent with  acute presentation with potential threat to life or bodily function          Final Clinical Impression(s) / ED Diagnoses Final diagnoses:  Right upper quadrant abdominal pain    Rx / DC Orders ED Discharge Orders     None         Zadie Rhine,  MD 12/07/22 [phone]

## 2022-12-07 NOTE — ED Provider Notes (Signed)
Received patient in turnover from Dr. Bebe Shaggy.  Please see their note for further details of Hx, PE.  Briefly patient is a 77 y.o. female with a Abdominal Pain .  Patient found to have a significant leukocytosis, hyponatremia hypochloremia.  Awaiting CT imaging of the abdomen pelvis. CT scan is concerning for acute cholecystitis.  Will start on antibiotics.  Will discuss with general surgery.  GEN surgery to admit.   Melene Plan, DO 12/07/22 1547

## 2022-12-07 NOTE — H&P (Signed)
Admission Note  Cynthia Bradshaw [birth year]  [id number].    Requesting MD: Dr. Adela Lank Chief Complaint/Reason for Consult: acute calculous cholecystitis  HPI:  77 y.o. female with medical history significant for cardiomyopathy, GERD, hypertension, IBS who presented to [facility] ED with abdominal pain and vomiting that began yesterday. She had a cinnamon roll for breakfast and then was working outside during the day and did not eat lunch when she developed acute onset right upper abdominal pain.  She thought it may be related to her chronic back pain or history of constipation/colon issues status post colostomy and reversal in 2007.  She tried MiraLAX and had a bowel movement without resolution of her symptoms.  She developed nausea and emesis that she believes was secondary to the pain and came to the ED for further evaluation.  In the ED significant for CT scan showing findings concerning for acute calculus cholecystitis and leukocytosis.  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   ROS: Reviewed and as above  Family History  Problem Relation Age of Onset   Breast cancer Neg Hx     Past Medical History:  Diagnosis Date   Anxiety    Arthritis    lower back   Cardiomyopathy (HCC)    Constipation    Dysrhythmia    LBBB   GERD (gastroesophageal reflux disease)    Headache    High cholesterol    Hypertension    IBS (irritable bowel syndrome)    Migraines    Seasonal allergies     Past Surgical History:  Procedure Laterality Date   ABDOMINAL SURGERY     ANTERIOR LAT LUMBAR FUSION N/A 02/28/2020   Procedure: Lateral Lumbar Interbody Fusion Lumbar two- lumbar three, Lumbar three-four, Lumbar four-five RIGHT;  Surgeon: Bedelia Person, MD;  Location: [facility] OR;  Service: Neurosurgery;   Laterality: N/A;   BREAST EXCISIONAL BIOPSY Left    COLON SURGERY     COLONOSCOPY     COLOSTOMY     COLOSTOMY REVERSAL     LUMBAR PERCUTANEOUS PEDICLE SCREW 3 LEVEL N/A 02/28/2020   Procedure: Minimally Invasive Right Lumbar four-five Laminectomy and Facetectomy with Percutaneous Pedicle Screw Placement Lumbar two-five;  Surgeon: Bedelia Person, MD;  Location: [facility] OR;  Service: Neurosurgery;  Laterality: N/A;    Social History:  reports that she has never smoked. She has never used smokeless tobacco. She reports current alcohol use. She reports that she does not use drugs.  Allergies:  Allergies  Allergen Reactions   Carvedilol Other (See Comments)    blurry vision, increased light sensitivity, and severe dry eyes    (Not in a hospital admission)   Blood pressure (!) 132/54, pulse 70, temperature 99.2 F (37.3 C), resp. rate 19, height 5\' 4"  (1.626 m), weight 55.3 kg, SpO2 97%. Physical Exam: General: pleasant, WD, female who is laying in bed in NAD HEENT: head is normocephalic, atraumatic.  Sclera are noninjected.  Pupils equal and round. EOMs intact.  Ears and nose without any masses or lesions.  Mouth is pink and moist Heart: regular, rate, and rhythm.  Lungs: Respiratory effort nonlabored Abd: soft, ND. Focal TTP RUQ with guarding. Well healed midline incisional scar and LLQ colostomy incisional scar MSK: all 4 extremities are symmetrical with no cyanosis, clubbing, or edema. Skin: warm and dry with  no masses, lesions, or rashes Neuro: Cranial nerves 2-12 grossly intact, sensation is normal throughout Psych: A&Ox3 with an appropriate affect.    Results for orders placed or performed during the hospital encounter of 12/07/22 (from the past 48 hour(s))  Lipase, blood     Status: None   Collection Time: 12/07/22  2:06 AM  Result Value Ref Range   Lipase 27 11 - 51 U/L    Comment: Performed at [facility], [address] [place], [place] [address]  Comprehensive  metabolic panel     Status: Abnormal   Collection Time: 12/07/22  2:06 AM  Result Value Ref Range   Sodium 128 (L) 135 - 145 mmol/L   Potassium 3.6 3.5 - 5.1 mmol/L   Chloride 95 (L) 98 - 111 mmol/L   CO2 22 22 - 32 mmol/L   Glucose, Bld 142 (H) 70 - 99 mg/dL    Comment: Glucose reference range applies only to samples taken after fasting for at least 8 hours.   BUN 10 8 - 23 mg/dL   Creatinine, Ser 9.14 0.44 - 1.00 mg/dL   Calcium 9.3 8.9 - 78.2 mg/dL   Total Protein 7.0 6.5 - 8.1 g/dL   Albumin 4.0 3.5 - 5.0 g/dL   AST 23 15 - 41 U/L   ALT 17 0 - 44 U/L   Alkaline Phosphatase 48 38 - 126 U/L   Total Bilirubin 0.8 <1.2 mg/dL   GFR, Estimated >95 >62 mL/min    Comment: (NOTE) Calculated using the CKD-EPI Creatinine Equation (2021)    Anion gap 11 5 - 15    Comment: Performed at [facility], [address] [place], [place] [address]  CBC     Status: Abnormal   Collection Time: 12/07/22  2:06 AM  Result Value Ref Range   WBC 25.0 (H) 4.0 - 10.5 K/uL   RBC 3.82 (L) 3.87 - 5.11 MIL/uL   Hemoglobin 11.8 (L) 12.0 - 15.0 g/dL   HCT 57.8 (L) 46.9 - 62.9 %   MCV 92.4 80.0 - 100.0 fL   MCH 30.9 26.0 - 34.0 pg   MCHC 33.4 30.0 - 36.0 g/dL   RDW 52.8 41.3 - 24.4 %   Platelets 318 150 - 400 K/uL   nRBC 0.0 0.0 - 0.2 %    Comment: Performed at [facility], [address] [place], [place] [address]  Urinalysis, Routine w reflex microscopic -Urine, Clean Catch     Status: Abnormal   Collection Time: 12/07/22  2:15 AM  Result Value Ref Range   Color, Urine YELLOW YELLOW   APPearance CLEAR CLEAR   Specific Gravity, Urine 1.015 1.005 - 1.030   pH 5.0 5.0 - 8.0   Glucose, UA NEGATIVE NEGATIVE mg/dL   Hgb urine dipstick SMALL (A) NEGATIVE   Bilirubin Urine NEGATIVE NEGATIVE   Ketones, ur 5 (A) NEGATIVE mg/dL   Protein, ur 253 (A) NEGATIVE mg/dL   Nitrite NEGATIVE NEGATIVE   Leukocytes,Ua NEGATIVE NEGATIVE   RBC / HPF 0-5 0 - 5 RBC/hpf   WBC, UA 0-5 0 - 5 WBC/hpf    Bacteria, UA RARE (A) NONE SEEN   Squamous Epithelial / HPF 0-5 0 - 5 /HPF   Mucus PRESENT    Hyaline Casts, UA PRESENT     Comment: Performed at [facility], [address] [place], [place] [address]   CT ABDOMEN PELVIS W CONTRAST  Result Date: 12/07/2022 CLINICAL DATA:  RLQ abdominal pain EXAM: CT ABDOMEN AND PELVIS WITH CONTRAST TECHNIQUE: Multidetector CT imaging of the abdomen and pelvis was performed using the standard protocol following bolus administration of intravenous contrast. RADIATION DOSE REDUCTION: This exam was performed according to the departmental dose-optimization program which includes automated exposure control, adjustment of the mA and/or kV according to patient size and/or use of iterative reconstruction technique. CONTRAST:  60mL OMNIPAQUE IOHEXOL 350 MG/ML SOLN COMPARISON:  None Available. FINDINGS: Lower chest: Bilateral lower lobe atelectasis. No acute abnormality. Hepatobiliary: No focal liver abnormality. Calcified gallstone noted within the gallbladder lumen. Associated gallbladder wall thickening and pericholecystic fluid. No biliary dilatation. Pancreas: No focal lesion. Normal pancreatic contour. No surrounding inflammatory changes. No main pancreatic ductal dilatation. Spleen: Normal in size without focal abnormality. Adrenals/Urinary Tract: No adrenal nodule bilaterally. Bilateral kidneys enhance symmetrically. Nonspecific trace right perinephric fat stranding likely related to gallbladder pathology. No hydronephrosis. No hydroureter. The urinary bladder is unremarkable. On delayed imaging, there is no urothelial wall thickening and there are no filling defects in the opacified portions of the bilateral collecting systems or ureters. Stomach/Bowel: Stomach is within normal limits. Stool throughout the ascending and transverse colon. No evidence of bowel wall thickening or dilatation. Appendix appears normal. Vascular/Lymphatic: No abdominal aorta or iliac  aneurysm. Moderate atherosclerotic plaque of the aorta and its branches. No abdominal, pelvic, or inguinal lymphadenopathy. Reproductive: Uterus and bilateral adnexa are unremarkable. Other: No intraperitoneal free fluid. No intraperitoneal free gas. No organized fluid collection. Musculoskeletal: No abdominal wall hernia or abnormality. Mild diastasis rectus left of midline. No suspicious lytic or blastic osseous lesions. No acute displaced fracture. L2-L5 posterolateral interbody surgical hardware fusion. Grade 1 anterolisthesis of L4 on L5. Grade 1 anterolisthesis of L5 on S1. Multilevel degenerative changes spine with intervertebral disc space vacuum phenomenon at the L5-S1 level. IMPRESSION: Cholelithiasis with acute cholecystitis. Recommend surgical consultation. Electronically Signed   By: Tish Frederickson M.D.   On: 12/07/2022 09:13      Assessment/Plan Acute calculous cholecystitis  Patient seen and examined relevant labs and imaging personally reviewed. Work up and exam consistent with acute calculus cholecystitis and recommend laparoscopic cholecystectomy for definitive treatment. I have explained the procedure, risks, and aftercare of cholecystectomy.  Risks include but are not limited to bleeding, infection, wound problems, anesthesia, diarrhea, bile leak, injury to common bile duct/liver/intestine. We also discussed given her surgical history potential risk for adhesions and associated complications including risk for conversion to open procedure. She seems to understand and agrees to proceed. Will admit to observation post op  Family at beside during exam and planning  FEN: NPO for OR ID: [email or web address] in ED VTE: LMWH post op   I reviewed ED provider notes, last 24 h vitals and pain scores, last 48 h intake and output, last 24 h labs and trends, and last 24 h imaging results.   Eric Form, [facility] Surgery 12/07/2022, 10:58 AM Please see Amion for pager number during day  hours 7:00am-4:30pm

## 2022-12-07 NOTE — Anesthesia Procedure Notes (Signed)
Anesthesia Regional Block: TAP block   Pre-Anesthetic Checklist: , timeout performed,  Correct Patient, Correct Site, Correct Laterality,  Correct Procedure, Correct Position, site marked,  Risks and benefits discussed,  Surgical consent,  Pre-op evaluation,  At surgeon's request and post-op pain management  Laterality: Left and Right  Prep: chloraprep       Needles:  Injection technique: Single-shot  Needle Type: Echogenic Stimulator Needle      Needle Gauge: 21     Additional Needles:   Procedures:,,,, ultrasound used (permanent image in chart),,    Narrative:  Start time: 12/07/2022 1:35 PM End time: 12/07/2022 2:00 PM Injection made incrementally with aspirations every 5 mL.  Performed by: Personally  Anesthesiologist: Lewie Loron, MD  Additional Notes: BP cuff, SpO2 and EKG monitors applied. Sedation begun.  Anesthetic injected incrementally, slowly, and after neg aspirations under direct ultrasound guidance. Good fascial spread noted. Patient tolerated well.

## 2022-12-07 NOTE — Anesthesia Procedure Notes (Signed)
Procedure Name: Intubation Date/Time: 12/07/2022 2:15 PM  Performed by: Gus Puma, CRNAPre-anesthesia Checklist: Patient identified, Emergency Drugs available, Suction available and Patient being monitored Patient Re-evaluated:Patient Re-evaluated prior to induction Oxygen Delivery Method: Circle System Utilized Preoxygenation: Pre-oxygenation with 100% oxygen Induction Type: IV induction Ventilation: Mask ventilation without difficulty Laryngoscope Size: Mac and 3 Grade View: Grade I Tube type: Oral Tube size: 7.0 mm Number of attempts: 1 Airway Equipment and Method: Stylet Placement Confirmation: ETT inserted through vocal cords under direct vision, positive ETCO2 and breath sounds checked- equal and bilateral Secured at: 22 cm Tube secured with: Tape Dental Injury: Teeth and Oropharynx as per pre-operative assessment

## 2022-12-07 NOTE — Op Note (Signed)
Preoperative diagnosis: Acute cholecystitis Postoperative diagnosis: Gangrenous cholecystitis Procedure: 1.  Laparoscopic lysis of adhesions x 1 hour 2.  Laparoscopic cholecystectomy Surgeon: Dr. Harden Mo Assistant: Barnetta Chapel, PA-C Anesthesia: General Estimated blood loss: 250 cc Complications: None Drains: None Specimens: Gallbladder and contents to pathology Sponge needle count was correct completion Disposition recovery stable condition  Indications: This is a 77 year old female who has a prior colectomy, colostomy followed by a colostomy reversal.  She presents with right upper quadrant pain.  This is persistent.  She had a CT scan and exam consistent with acute calculus cholecystitis and a white blood cell count of 25,000.  We discussed proceeding with a laparoscopic cholecystectomy with the high risk of doing an open procedure given her prior surgery.  Procedure: After informed consent was obtained she was taken to the operating room.  She was given antibiotics.  SCDs were in place.  She was placed under general anesthesia without complication.  She was prepped and draped in standard sterile surgical fashion.  A surgical timeout was then performed.  I infiltrated Marcaine in the left upper quadrant.  I then dissected down to the fascia.  I made a small incision in the fascia and dissected down to the peritoneum.  I entered into the peritoneum with Metzenbaum scissors.  Once I done this I inserted a 5 mm balloontipped trocar.  I insufflated the abdomen to 15 mmHg pressure.  There was no evidence of any entry injury.  She had adhesions throughout her abdomen.  I was in a spot that was free but there were no other free areas except for the left gutter.  I placed 2 additional 5 mm trocars in the left abdomen.  I then proceeded to lyse adhesions which were primarily the omentum from the abdominal wall with a combination of scissors as well as blunt dissection.  I then had to use the  cautery to remove the liver from the abdominal wall where it was adherent as well.  I cleared this off down to the umbilicus.  Below that there was bowel that was very adherent to her abdominal wall.  If she ever has another abdominal operation I would not into her abdomen below her umbilicus due to the bowel that is fused to the abdominal wall.  I then placed an additional 5 mm trocar in the epigastrium which I later sized to an 11 mm trocar.  I placed a 5 mm trocar in the right abdomen as well as a periumbilical trocar.  The gallbladder was then identified.  It was encased in omentum.  It took some time and the gallbladder had purulence outside of it but we were able to retract the gallbladder cephalad.  I also had to aspirate the gallbladder just to be able to retract it appropriately.  This area clearly had been dissected I think where the year was a search to find a perforated ulcer before.  The duodenum was adherent to the gallbladder and this was taken down with sharp dissection.  Eventually I was able to get to the point where I could dissect the in the triangle.  I used the ICG dye to completely identify the common bile duct.  Once I had done this I was able to dissect in the triangle.  I elected also to take the gallbladder top-down to hopefully open up the space a little bit more.  Once I done this I was clearly able to obtain the critical view of safety view in both the  cystic artery and the cystic duct.  I clipped the cystic artery and divided it leaving 2 clips in place.  I clipped the cystic duct dividing it leaving 2 clips in place.  The clips completely traverse the duct and the duct was viable.  The gallbladder then was removed from the liver bed and placed in retrieval bag for her.  I remove this from the epigastric incision.  I then obtained hemostasis.  I then remove the epigastric trocar and placed a 0 Vicryl suture using the suture passer device to completely obliterate this defect.  I remove  the 5 mm entry spot and closed this 1 as well.  The remaining trocars were removed and the abdomen was desufflated.  These were closed with 4 Monocryl and glue.  She tolerated this well was transferred recovery stable.

## 2022-12-07 NOTE — ED Triage Notes (Signed)
Pt here from home with c/o right upper quadrant pain radiates to her back , no fevers or sob

## 2022-12-07 NOTE — ED Notes (Signed)
Patient transported to CT 

## 2022-12-07 NOTE — Anesthesia Postprocedure Evaluation (Signed)
Anesthesia Post Note  Patient: [name]  Procedure(s) Performed: LAPAROSCOPIC CHOLECYSTECTOMY WITH ICG DYE (Abdomen)     Patient location during evaluation: PACU Anesthesia Type: General Level of consciousness: awake and alert Pain management: pain level controlled Vital Signs Assessment: post-procedure vital signs reviewed and stable Respiratory status: spontaneous breathing, nonlabored ventilation and respiratory function stable Cardiovascular status: blood pressure returned to baseline and stable Postop Assessment: no apparent nausea or vomiting Anesthetic complications: no  No notable events documented.  Last Vitals:  Vitals:   12/07/22 1700 12/07/22 1715  BP: 106/60 (!) 105/50  Pulse: 80 63  Resp: 17 17  Temp:  37.1 C  SpO2: 94% 94%    Last Pain:  Vitals:   12/07/22 1715  TempSrc:   PainSc: 0-No pain                 Jakori Burkett,W. EDMOND

## 2022-12-08 ENCOUNTER — Encounter (HOSPITAL_COMMUNITY): Payer: Self-pay | Admitting: General Surgery

## 2022-12-08 LAB — CBC
HCT: 23 % — ABNORMAL LOW (ref 36.0–46.0)
HCT: 23.4 % — ABNORMAL LOW (ref 36.0–46.0)
Hemoglobin: 7.7 g/dL — ABNORMAL LOW (ref 12.0–15.0)
Hemoglobin: 7.8 g/dL — ABNORMAL LOW (ref 12.0–15.0)
MCH: 31 pg (ref 26.0–34.0)
MCH: 31.1 pg (ref 26.0–34.0)
MCHC: 33.5 g/dL (ref 30.0–36.0)
MCHC: 33.3 g/dL (ref 30.0–36.0)
MCV: 92.7 fL (ref 80.0–100.0)
MCV: 93.2 fL (ref 80.0–100.0)
Platelets: 210 10*3/uL (ref 150–400)
Platelets: 223 10*3/uL (ref 150–400)
RBC: 2.48 MIL/uL — ABNORMAL LOW (ref 3.87–5.11)
RBC: 2.51 MIL/uL — ABNORMAL LOW (ref 3.87–5.11)
RDW: 12.2 % (ref 11.5–15.5)
RDW: 12.2 % (ref 11.5–15.5)
WBC: 15 10*3/uL — ABNORMAL HIGH (ref 4.0–10.5)
WBC: 18.3 10*3/uL — ABNORMAL HIGH (ref 4.0–10.5)
nRBC: 0 % (ref 0.0–0.2)
nRBC: 0 % (ref 0.0–0.2)

## 2022-12-08 LAB — TYPE AND SCREEN
ABO/RH(D): A NEG
Antibody Screen: NEGATIVE

## 2022-12-08 LAB — COMPREHENSIVE METABOLIC PANEL
ALT: 71 U/L — ABNORMAL HIGH (ref 0–44)
AST: 61 U/L — ABNORMAL HIGH (ref 15–41)
Albumin: 2.6 g/dL — ABNORMAL LOW (ref 3.5–5.0)
Alkaline Phosphatase: 42 U/L (ref 38–126)
Anion gap: 8 (ref 5–15)
BUN: 18 mg/dL (ref 8–23)
CO2: 23 mmol/L (ref 22–32)
Calcium: 7.6 mg/dL — ABNORMAL LOW (ref 8.9–10.3)
Chloride: 95 mmol/L — ABNORMAL LOW (ref 98–111)
Creatinine, Ser: 1.07 mg/dL — ABNORMAL HIGH (ref 0.44–1.00)
GFR, Estimated: 54 mL/min — ABNORMAL LOW (ref >60)
Glucose, Bld: 140 mg/dL — ABNORMAL HIGH (ref 70–99)
Potassium: 4.2 mmol/L (ref 3.5–5.1)
Sodium: 126 mmol/L — ABNORMAL LOW (ref 135–145)
Total Bilirubin: 0.4 mg/dL (ref <1.2)
Total Protein: 5.2 g/dL — ABNORMAL LOW (ref 6.5–8.1)

## 2022-12-08 NOTE — Plan of Care (Signed)
  Problem: Education: Goal: Knowledge of General Education information will improve Description Including pain rating scale, medication(s)/side effects and non-pharmacologic comfort measures Outcome: Progressing   

## 2022-12-08 NOTE — Progress Notes (Signed)
Progress Note  1 Day Post-Op  Subjective: In good spirits and feeling well this am. Pain controlled with tylenol. Tolerating fld without n/v/abdominal pain.   Objective: Vital signs in last 24 hours: Temp:  [98 F (36.7 C)-100.4 F (38 C)] 98.1 F (36.7 C) (11/19 0826) Pulse Rate:  [59-82] 59 (11/19 0826) Resp:  [15-19] 18 (11/19 0826) BP: (81-143)/(46-81) 116/56 (11/19 0826) SpO2:  [93 %-99 %] 97 % (11/19 0826) Weight:  [55.3 kg] 55.3 kg (11/18 1249) Last BM Date : 12/07/22  Intake/Output from previous day: 11/18 0701 - 11/19 0700 In: 1832.6 [P.O.:240; I.V.:1511.5; IV Piggyback:81.2] Out: -  Intake/Output this shift: No intake/output data recorded.  PE: General: pleasant, WD, female who is laying in bed in NAD HEENT: head is normocephalic, atraumatic.  Sclera are noninjected.  Pupils equal and round. EOMs intact.  Ears and nose without any masses or lesions.  Mouth is pink and moist Lungs: Respiratory effort nonlabored Abd: soft, ND, mild expected TTP over incision which are cdi. no RUQ TTP MSK: all 4 extremities are symmetrical with no cyanosis, clubbing, or edema. Skin: warm and dry  Psych: A&Ox3 with an appropriate affect.    Lab Results:  Recent Labs    12/07/22 2215 12/08/22 0755  WBC 14.3* 15.0*  HGB 8.7* 7.7*  HCT 25.6* 23.0*  PLT 234 210   BMET Recent Labs    12/07/22 0206  NA 128*  K 3.6  CL 95*  CO2 22  GLUCOSE 142*  BUN 10  CREATININE 0.77  CALCIUM 9.3   PT/INR No results for input(s): "LABPROT", "INR" in the last 72 hours. CMP     Component Value Date/Time   NA 128 (L) 12/07/2022 0206   NA 140 07/03/2021 1102   K 3.6 12/07/2022 0206   CL 95 (L) 12/07/2022 0206   CO2 22 12/07/2022 0206   GLUCOSE 142 (H) 12/07/2022 0206   BUN 10 12/07/2022 0206   BUN 10 07/03/2021 1102   CREATININE 0.77 12/07/2022 0206   CALCIUM 9.3 12/07/2022 0206   PROT 7.0 12/07/2022 0206   ALBUMIN 4.0 12/07/2022 0206   AST 23 12/07/2022 0206   ALT 17  12/07/2022 0206   ALKPHOS 48 12/07/2022 0206   BILITOT 0.8 12/07/2022 0206   GFRNONAA >60 12/07/2022 0206   GFRAA >60 09/24/2015 0601   Lipase     Component Value Date/Time   LIPASE 27 12/07/2022 0206       Studies/Results: CT ABDOMEN PELVIS W CONTRAST  Result Date: 12/07/2022 CLINICAL DATA:  RLQ abdominal pain EXAM: CT ABDOMEN AND PELVIS WITH CONTRAST TECHNIQUE: Multidetector CT imaging of the abdomen and pelvis was performed using the standard protocol following bolus administration of intravenous contrast. RADIATION DOSE REDUCTION: This exam was performed according to the departmental dose-optimization program which includes automated exposure control, adjustment of the mA and/or kV according to patient size and/or use of iterative reconstruction technique. CONTRAST:  60mL OMNIPAQUE IOHEXOL 350 MG/ML SOLN COMPARISON:  None Available. FINDINGS: Lower chest: Bilateral lower lobe atelectasis. No acute abnormality. Hepatobiliary: No focal liver abnormality. Calcified gallstone noted within the gallbladder lumen. Associated gallbladder wall thickening and pericholecystic fluid. No biliary dilatation. Pancreas: No focal lesion. Normal pancreatic contour. No surrounding inflammatory changes. No main pancreatic ductal dilatation. Spleen: Normal in size without focal abnormality. Adrenals/Urinary Tract: No adrenal nodule bilaterally. Bilateral kidneys enhance symmetrically. Nonspecific trace right perinephric fat stranding likely related to gallbladder pathology. No hydronephrosis. No hydroureter. The urinary bladder is unremarkable. On delayed imaging,  there is no urothelial wall thickening and there are no filling defects in the opacified portions of the bilateral collecting systems or ureters. Stomach/Bowel: Stomach is within normal limits. Stool throughout the ascending and transverse colon. No evidence of bowel wall thickening or dilatation. Appendix appears normal. Vascular/Lymphatic: No abdominal  aorta or iliac aneurysm. Moderate atherosclerotic plaque of the aorta and its branches. No abdominal, pelvic, or inguinal lymphadenopathy. Reproductive: Uterus and bilateral adnexa are unremarkable. Other: No intraperitoneal free fluid. No intraperitoneal free gas. No organized fluid collection. Musculoskeletal: No abdominal wall hernia or abnormality. Mild diastasis rectus left of midline. No suspicious lytic or blastic osseous lesions. No acute displaced fracture. L2-L5 posterolateral interbody surgical hardware fusion. Grade 1 anterolisthesis of L4 on L5. Grade 1 anterolisthesis of L5 on S1. Multilevel degenerative changes spine with intervertebral disc space vacuum phenomenon at the L5-S1 level. IMPRESSION: Cholelithiasis with acute cholecystitis. Recommend surgical consultation. Electronically Signed   By: Tish Frederickson M.D.   On: 12/07/2022 09:13    Anti-infectives: Anti-infectives (From admission, onward)    Start     Dose/Rate Route Frequency Ordered Stop   12/07/22 2200  piperacillin-tazobactam (ZOSYN) IVPB 3.375 g        3.375 g 12.5 mL/hr over 240 Minutes Intravenous Every 8 hours 12/07/22 1735 12/08/22 2159   12/07/22 1445  piperacillin-tazobactam (ZOSYN) IVPB 3.375 g        3.375 g 100 mL/hr over 30 Minutes Intravenous  Once 12/07/22 1431 12/07/22 1448   12/07/22 0930  piperacillin-tazobactam (ZOSYN) IVPB 3.375 g        3.375 g 100 mL/hr over 30 Minutes Intravenous  Once 12/07/22 0916 12/07/22 1022        Assessment/Plan  gangrenous cholecystitis POD1 s/p lap chole LOA 11/18 Dr. Dwain Sarna  Pain controlled. Tolerating FLD. Advance to regular. Hgb down to 7.7 this am. Recheck later and if stable possible dc today  FEN: reg ID: [email or web address] VTE: [email or web address]     LOS: 1 day   Eric Form, [facility] Surgery 12/08/2022, 8:50 AM Please see Amion for pager number during day hours 7:00am-4:30pm

## 2022-12-09 LAB — CBC
HCT: 24 % — ABNORMAL LOW (ref 36.0–46.0)
Hemoglobin: 8.1 g/dL — ABNORMAL LOW (ref 12.0–15.0)
MCH: 31.4 pg (ref 26.0–34.0)
MCHC: 33.8 g/dL (ref 30.0–36.0)
MCV: 93 fL (ref 80.0–100.0)
Platelets: 221 10*3/uL (ref 150–400)
RBC: 2.58 MIL/uL — ABNORMAL LOW (ref 3.87–5.11)
RDW: 12.1 % (ref 11.5–15.5)
WBC: 15.7 10*3/uL — ABNORMAL HIGH (ref 4.0–10.5)
nRBC: 0 % (ref 0.0–0.2)

## 2022-12-09 LAB — SURGICAL PATHOLOGY

## 2022-12-09 MED ORDER — OXYCODONE HCL 5 MG PO TABS: 5.0000 mg | ORAL_TABLET | Freq: Four times a day (QID) | ORAL | 0 refills | Status: AC | PRN

## 2022-12-09 NOTE — Plan of Care (Signed)
  Problem: Clinical Measurements: Goal: Will remain free from infection Outcome: Progressing   Problem: Nutrition: Goal: Adequate nutrition will be maintained Outcome: Progressing   Problem: Coping: Goal: Level of anxiety will decrease Outcome: Progressing   Problem: Elimination: Goal: Will not experience complications related to bowel motility Outcome: Progressing   Problem: Pain Management: Goal: General experience of comfort will improve Outcome: Progressing   Problem: Safety: Goal: Ability to remain free from injury will improve Outcome: Progressing   Problem: Skin Integrity: Goal: Risk for impaired skin integrity will decrease Outcome: Progressing   Problem: Pain Management: Goal: General experience of comfort will improve Outcome: Progressing

## 2022-12-09 NOTE — Progress Notes (Signed)
   Progress Note  2 Days Post-Op  Subjective: Ambulating. Tolerating diet. Pain controlled.  Objective: Vital signs in last 24 hours: Temp:  [97.9 F (36.6 C)-98.2 F (36.8 C)] 97.9 F (36.6 C) (11/20 0729) Pulse Rate:  [64-69] 64 (11/20 0729) Resp:  [16-18] 16 (11/20 0729) BP: (112-160)/(56-64) 147/62 (11/20 0729) SpO2:  [96 %-98 %] 98 % (11/20 0729) Last BM Date : 12/08/22  Intake/Output from previous day: 11/19 0701 - 11/20 0700 In: 840 [P.O.:840] Out: -  Intake/Output this shift: No intake/output data recorded.  PE: General: pleasant, WD, female who is laying in bed in NAD Lungs: Respiratory effort nonlabored Abd: soft, ND, mild expected TTP over incision which are cdi. no RUQ TTP MSK: all 4 extremities are symmetrical with no cyanosis, clubbing, or edema. Skin: warm and dry  Psych: A&Ox3 with an appropriate affect.    Lab Results:  Recent Labs    12/08/22 1200 12/09/22 0751  WBC 18.3* 15.7*  HGB 7.8* 8.1*  HCT 23.4* 24.0*  PLT 223 221   BMET Recent Labs    12/07/22 0206 12/08/22 0755  NA 128* 126*  K 3.6 4.2  CL 95* 95*  CO2 22 23  GLUCOSE 142* 140*  BUN 10 18  CREATININE 0.77 1.07*  CALCIUM 9.3 7.6*   PT/INR No results for input(s): "LABPROT", "INR" in the last 72 hours. CMP     Component Value Date/Time   NA 126 (L) 12/08/2022 0755   NA 140 07/03/2021 1102   K 4.2 12/08/2022 0755   CL 95 (L) 12/08/2022 0755   CO2 23 12/08/2022 0755   GLUCOSE 140 (H) 12/08/2022 0755   BUN 18 12/08/2022 0755   BUN 10 07/03/2021 1102   CREATININE 1.07 (H) 12/08/2022 0755   CALCIUM 7.6 (L) 12/08/2022 0755   PROT 5.2 (L) 12/08/2022 0755   ALBUMIN 2.6 (L) 12/08/2022 0755   AST 61 (H) 12/08/2022 0755   ALT 71 (H) 12/08/2022 0755   ALKPHOS 42 12/08/2022 0755   BILITOT 0.4 12/08/2022 0755   GFRNONAA 54 (L) 12/08/2022 0755   GFRAA >60 09/24/2015 0601   Lipase     Component Value Date/Time   LIPASE 27 12/07/2022 0206       Studies/Results: No  results found.  Anti-infectives: Anti-infectives (From admission, onward)    Start     Dose/Rate Route Frequency Ordered Stop   12/07/22 2200  piperacillin-tazobactam (ZOSYN) IVPB 3.375 g        3.375 g 12.5 mL/hr over 240 Minutes Intravenous Every 8 hours 12/07/22 1735 12/08/22 1718   12/07/22 1445  piperacillin-tazobactam (ZOSYN) IVPB 3.375 g        3.375 g 100 mL/hr over 30 Minutes Intravenous  Once 12/07/22 1431 12/07/22 1448   12/07/22 0930  piperacillin-tazobactam (ZOSYN) IVPB 3.375 g        3.375 g 100 mL/hr over 30 Minutes Intravenous  Once 12/07/22 0916 12/07/22 1022        Assessment/Plan  gangrenous cholecystitis POD2 s/p lap chole LOA 11/18 Dr. Dwain Sarna  Pain controlled. Tolerating diet. Hgb improved to 8.1. dc today  FEN: reg ID: [email or web address] VTE: lovenox     LOS: 2 days   Eric Form, Ku [facility] Surgery 12/09/2022, 8:56 AM Please see Amion for pager number during day hours 7:00am-4:30pm

## 2022-12-09 NOTE — Discharge Instructions (Signed)

## 2022-12-10 NOTE — Discharge Summary (Signed)
[facility] Discharge Summary   Patient ID: ARIEAL LEMIN MRN: [id number] DOB/AGE: 06-19-1945 77 y.o.  Admit date: 12/07/2022 Discharge date: 12/09/2022  Admitting Diagnosis: Acute cholecystitis [K81.0] Right upper quadrant abdominal pain [R10.11]   Discharge Diagnosis Acute cholecystitis [K81.0] Right upper quadrant abdominal pain [R10.11] Acute blood loss anemia  Consultants None  Imaging: No results found.  Procedures Dr. Dwain Sarna (12/07/22) - Laparoscopic Cholecystectomy with lysis of adhesions  Hospital Course:  77 y.o. female who presented to [facility] ED with abdominal pain.  Workup showed acute cholecystitis.  Patient was admitted and underwent procedure listed above.  Tolerated procedure well and was transferred to the floor.  Diet was advanced as tolerated.  On POD2, the patient was voiding well, tolerating diet, ambulating well, pain well controlled, vital signs stable, incisions c/d/i and felt stable for discharge home.  She had anemia secondary to blood loss which stabilized. Patient will follow up in our office in 3 weeks and knows to call with questions or concerns.   I or a member of my team have reviewed this patient in the Controlled Substance Database.   Allergies as of 12/09/2022       Reactions   Carvedilol Other (See Comments)   blurry vision, increased light sensitivity, and severe dry eyes        Medication List     TAKE these medications    acetaminophen 650 MG CR tablet Commonly known as: TYLENOL Take 650 mg by mouth as needed for pain.   atorvastatin 20 MG tablet Commonly known as: LIPITOR Take 20 mg by mouth daily.   cholecalciferol 1000 units tablet Commonly known as: VITAMIN D Take 1,000 Units by mouth daily.   cyclobenzaprine 5 MG tablet Commonly known as: FLEXERIL Take 5 mg by mouth 3 (three) times daily.   denosumab 60 MG/ML Sosy injection Commonly known as: PROLIA Inject 60 mg into the skin every 6  (six) months.   dicyclomine 20 MG tablet Commonly known as: BENTYL Take 20 mg by mouth as needed for spasms.   Digestive Advantage Caps Take 1 capsule by mouth daily.   gabapentin 300 MG capsule Commonly known as: NEURONTIN Take 300 mg by mouth as needed (nerve pain).   ibuprofen 600 MG tablet Commonly known as: ADVIL Take 600 mg by mouth 3 (three) times daily.   metoprolol succinate 50 MG 24 hr tablet Commonly known as: TOPROL-XL Take 1 tablet (50 mg total) by mouth daily. Take with or immediately following a meal.   multivitamin with minerals Tabs tablet Take 1 tablet by mouth daily.   omeprazole 20 MG capsule Commonly known as: PRILOSEC Take 20 mg by mouth daily.   oxyCODONE 5 MG immediate release tablet Commonly known as: Oxy IR/ROXICODONE Take 1 tablet (5 mg total) by mouth every 6 (six) hours as needed for up to 5 days for moderate pain (pain score 4-6) or severe pain (pain score 7-10).   polyethylene glycol 17 g packet Commonly known as: MIRALAX / GLYCOLAX Take 17 g by mouth daily as needed for mild constipation or moderate constipation.   rizatriptan 10 MG tablet Commonly known as: MAXALT Take 10 mg by mouth as needed for migraine. May repeat in 2 hours if needed   valsartan 160 MG tablet Commonly known as: DIOVAN TAKE ONE TABLET ONCE DAILY   vitamin E 180 MG (400 UNITS) capsule Take 400 Units by mouth daily.          Follow-up Information  Emelia Loron, MD. Call.   Specialty: General Surgery Why: We are making a follow up appointment for you., Please call to confirm appointment time., Arrive 30 minutes early to complete check in, and bring photo ID and insurance card. Contact information: [address] [place] [address] [phone]                 Signed: Clarise Cruz [facility] Surgery 12/10/2022, 2:46 PM Please see Amion for pager number during day hours 7:00am-4:30pm

## 2022-12-22 ENCOUNTER — Other Ambulatory Visit: Payer: Medicare Other

## 2023-04-12 ENCOUNTER — Encounter: Payer: Self-pay | Admitting: Internal Medicine

## 2023-04-12 DIAGNOSIS — Z0000 Encounter for general adult medical examination without abnormal findings: Secondary | ICD-10-CM

## 2023-04-13 ENCOUNTER — Other Ambulatory Visit: Payer: Self-pay | Admitting: Internal Medicine

## 2023-04-13 DIAGNOSIS — M810 Age-related osteoporosis without current pathological fracture: Secondary | ICD-10-CM

## 2023-04-16 ENCOUNTER — Other Ambulatory Visit: Payer: Self-pay | Admitting: Internal Medicine

## 2023-04-16 DIAGNOSIS — N644 Mastodynia: Secondary | ICD-10-CM

## 2023-05-21 ENCOUNTER — Ambulatory Visit
Admission: RE | Admit: 2023-05-21 | Discharge: 2023-05-21 | Disposition: A | Discharge status: Home / Self Care | Source: Ambulatory Visit | Attending: Internal Medicine | Admitting: Internal Medicine

## 2023-05-21 ENCOUNTER — Ambulatory Visit
Admission: RE | Admit: 2023-05-21 | Discharge: 2023-05-21 | Disposition: A | Discharge status: Home / Self Care | Source: Ambulatory Visit | Attending: Internal Medicine

## 2023-05-21 DIAGNOSIS — N644 Mastodynia: Secondary | ICD-10-CM

## 2023-06-26 ENCOUNTER — Other Ambulatory Visit: Payer: Self-pay | Admitting: Cardiology

## 2023-06-26 DIAGNOSIS — I428 Other cardiomyopathies: Secondary | ICD-10-CM

## 2023-07-23 ENCOUNTER — Other Ambulatory Visit: Payer: Self-pay | Admitting: Cardiology

## 2023-07-23 DIAGNOSIS — I428 Other cardiomyopathies: Secondary | ICD-10-CM

## 2023-08-17 ENCOUNTER — Other Ambulatory Visit: Payer: Self-pay | Admitting: Cardiology

## 2023-08-17 DIAGNOSIS — I428 Other cardiomyopathies: Secondary | ICD-10-CM

## 2023-12-14 ENCOUNTER — Other Ambulatory Visit

## 2023-12-30 ENCOUNTER — Ambulatory Visit (HOSPITAL_BASED_OUTPATIENT_CLINIC_OR_DEPARTMENT_OTHER)
Admission: RE | Admit: 2023-12-30 | Discharge: 2023-12-30 | Disposition: A | Discharge status: Home / Self Care | Source: Ambulatory Visit | Attending: Internal Medicine | Admitting: Internal Medicine

## 2023-12-30 DIAGNOSIS — M810 Age-related osteoporosis without current pathological fracture: Secondary | ICD-10-CM | POA: Diagnosis present
